# Patient Record
Sex: Female | Born: 1965 | Race: Black or African American | Hispanic: No | Smoking: Former smoker
Health system: Southern US, Community
[De-identification: ages and names within clinical notes are randomized; demographics above are authoritative.]

## PROBLEM LIST (undated history)

## (undated) ENCOUNTER — Ambulatory Visit (HOSPITAL_COMMUNITY): Admission: EM | Payer: BC Managed Care – PPO | Source: Home / Self Care

## (undated) DIAGNOSIS — Z86018 Personal history of other benign neoplasm: Secondary | ICD-10-CM

## (undated) DIAGNOSIS — R079 Chest pain, unspecified: Secondary | ICD-10-CM

## (undated) DIAGNOSIS — D219 Benign neoplasm of connective and other soft tissue, unspecified: Secondary | ICD-10-CM

## (undated) DIAGNOSIS — N946 Dysmenorrhea, unspecified: Secondary | ICD-10-CM

## (undated) DIAGNOSIS — G479 Sleep disorder, unspecified: Secondary | ICD-10-CM

## (undated) DIAGNOSIS — I471 Supraventricular tachycardia, unspecified: Secondary | ICD-10-CM

## (undated) DIAGNOSIS — R002 Palpitations: Secondary | ICD-10-CM

## (undated) DIAGNOSIS — F1010 Alcohol abuse, uncomplicated: Secondary | ICD-10-CM

## (undated) DIAGNOSIS — IMO0002 Reserved for concepts with insufficient information to code with codable children: Secondary | ICD-10-CM

## (undated) DIAGNOSIS — R0602 Shortness of breath: Secondary | ICD-10-CM

## (undated) DIAGNOSIS — I499 Cardiac arrhythmia, unspecified: Secondary | ICD-10-CM

## (undated) DIAGNOSIS — N920 Excessive and frequent menstruation with regular cycle: Secondary | ICD-10-CM

## (undated) DIAGNOSIS — I498 Other specified cardiac arrhythmias: Secondary | ICD-10-CM

## (undated) DIAGNOSIS — D649 Anemia, unspecified: Secondary | ICD-10-CM

## (undated) HISTORY — DX: Supraventricular tachycardia: I47.1

## (undated) HISTORY — DX: Supraventricular tachycardia, unspecified: I47.10

## (undated) HISTORY — DX: Excessive and frequent menstruation with regular cycle: N92.0

## (undated) HISTORY — DX: Dysmenorrhea, unspecified: N94.6

## (undated) HISTORY — PX: TUBAL LIGATION: SHX77

## (undated) HISTORY — DX: Chest pain, unspecified: R07.9

## (undated) HISTORY — DX: Personal history of other benign neoplasm: Z86.018

## (undated) HISTORY — PX: CARDIAC ELECTROPHYSIOLOGY MAPPING AND ABLATION: SHX1292

## (undated) HISTORY — DX: Sleep disorder, unspecified: G47.9

## (undated) HISTORY — PX: AUGMENTATION MAMMAPLASTY: SUR837

## (undated) HISTORY — DX: Palpitations: R00.2

## (undated) HISTORY — PX: ABDOMINAL HYSTERECTOMY: SHX81

## (undated) HISTORY — PX: UTERINE FIBROID SURGERY: SHX826

---
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
## 2000-07-27 HISTORY — PX: COMBINED AUGMENTATION MAMMAPLASTY AND ABDOMINOPLASTY: SUR291

## 2002-04-08 ENCOUNTER — Emergency Department (HOSPITAL_COMMUNITY): Admission: EM | Admit: 2002-04-08 | Discharge: 2002-04-08 | Payer: Self-pay | Admitting: Emergency Medicine

## 2002-04-08 ENCOUNTER — Encounter: Payer: Self-pay | Admitting: Emergency Medicine

## 2002-07-25 ENCOUNTER — Encounter: Admission: RE | Admit: 2002-07-25 | Discharge: 2002-07-25 | Payer: Self-pay | Admitting: Sports Medicine

## 2002-07-31 ENCOUNTER — Encounter: Admission: RE | Admit: 2002-07-31 | Discharge: 2002-07-31 | Payer: Self-pay | Admitting: Family Medicine

## 2002-08-01 ENCOUNTER — Encounter: Admission: RE | Admit: 2002-08-01 | Discharge: 2002-08-01 | Payer: Self-pay | Admitting: Family Medicine

## 2002-08-11 ENCOUNTER — Encounter: Admission: RE | Admit: 2002-08-11 | Discharge: 2002-08-11 | Payer: Self-pay | Admitting: Family Medicine

## 2005-01-22 ENCOUNTER — Emergency Department (HOSPITAL_COMMUNITY): Admission: EM | Admit: 2005-01-22 | Discharge: 2005-01-22 | Payer: Self-pay | Admitting: Family Medicine

## 2005-02-22 ENCOUNTER — Emergency Department (HOSPITAL_COMMUNITY): Admission: EM | Admit: 2005-02-22 | Discharge: 2005-02-22 | Payer: Self-pay | Admitting: Family Medicine

## 2005-04-12 ENCOUNTER — Emergency Department (HOSPITAL_COMMUNITY): Admission: EM | Admit: 2005-04-12 | Discharge: 2005-04-12 | Payer: Self-pay | Admitting: Family Medicine

## 2005-04-23 ENCOUNTER — Ambulatory Visit: Payer: Self-pay | Admitting: Internal Medicine

## 2005-06-10 ENCOUNTER — Ambulatory Visit: Payer: Self-pay | Admitting: Internal Medicine

## 2005-08-13 ENCOUNTER — Emergency Department (HOSPITAL_COMMUNITY): Admission: EM | Admit: 2005-08-13 | Discharge: 2005-08-13 | Payer: Self-pay | Admitting: Family Medicine

## 2005-08-14 ENCOUNTER — Ambulatory Visit (HOSPITAL_COMMUNITY): Admission: RE | Admit: 2005-08-14 | Discharge: 2005-08-14 | Payer: Self-pay | Admitting: Family Medicine

## 2005-08-19 ENCOUNTER — Other Ambulatory Visit: Admission: RE | Admit: 2005-08-19 | Discharge: 2005-08-19 | Payer: Self-pay | Admitting: Obstetrics and Gynecology

## 2006-09-23 DIAGNOSIS — R002 Palpitations: Secondary | ICD-10-CM

## 2006-09-23 HISTORY — DX: Palpitations: R00.2

## 2006-11-01 ENCOUNTER — Ambulatory Visit: Payer: Self-pay | Admitting: Internal Medicine

## 2007-02-14 ENCOUNTER — Emergency Department (HOSPITAL_COMMUNITY): Admission: EM | Admit: 2007-02-14 | Discharge: 2007-02-14 | Payer: Self-pay | Admitting: Family Medicine

## 2007-07-18 ENCOUNTER — Emergency Department (HOSPITAL_COMMUNITY): Admission: EM | Admit: 2007-07-18 | Discharge: 2007-07-18 | Payer: Self-pay | Admitting: Emergency Medicine

## 2007-07-18 ENCOUNTER — Ambulatory Visit: Payer: Self-pay | Admitting: Cardiology

## 2007-08-21 ENCOUNTER — Emergency Department (HOSPITAL_COMMUNITY): Admission: EM | Admit: 2007-08-21 | Discharge: 2007-08-21 | Payer: Self-pay | Admitting: Family Medicine

## 2007-08-22 ENCOUNTER — Ambulatory Visit: Payer: Self-pay | Admitting: Internal Medicine

## 2007-09-19 ENCOUNTER — Emergency Department (HOSPITAL_COMMUNITY): Admission: EM | Admit: 2007-09-19 | Discharge: 2007-09-19 | Payer: Self-pay | Admitting: Family Medicine

## 2007-11-07 ENCOUNTER — Emergency Department (HOSPITAL_COMMUNITY): Admission: EM | Admit: 2007-11-07 | Discharge: 2007-11-07 | Payer: Self-pay | Admitting: Family Medicine

## 2008-04-04 ENCOUNTER — Emergency Department (HOSPITAL_COMMUNITY): Admission: EM | Admit: 2008-04-04 | Discharge: 2008-04-04 | Payer: Self-pay | Admitting: Emergency Medicine

## 2008-11-13 DIAGNOSIS — R079 Chest pain, unspecified: Secondary | ICD-10-CM

## 2008-11-13 DIAGNOSIS — I498 Other specified cardiac arrhythmias: Secondary | ICD-10-CM

## 2008-11-13 DIAGNOSIS — F1010 Alcohol abuse, uncomplicated: Secondary | ICD-10-CM

## 2008-11-13 DIAGNOSIS — I471 Supraventricular tachycardia, unspecified: Secondary | ICD-10-CM | POA: Insufficient documentation

## 2008-11-13 HISTORY — DX: Chest pain, unspecified: R07.9

## 2008-11-13 HISTORY — DX: Alcohol abuse, uncomplicated: F10.10

## 2008-11-13 HISTORY — DX: Other specified cardiac arrhythmias: I49.8

## 2008-11-14 ENCOUNTER — Encounter: Payer: Self-pay | Admitting: Internal Medicine

## 2008-11-14 ENCOUNTER — Ambulatory Visit: Payer: Self-pay | Admitting: Internal Medicine

## 2008-12-12 ENCOUNTER — Telehealth: Payer: Self-pay | Admitting: Internal Medicine

## 2008-12-20 ENCOUNTER — Ambulatory Visit: Payer: Self-pay | Admitting: Internal Medicine

## 2008-12-20 ENCOUNTER — Emergency Department (HOSPITAL_COMMUNITY): Admission: EM | Admit: 2008-12-20 | Discharge: 2008-12-20 | Payer: Self-pay | Admitting: Emergency Medicine

## 2009-01-01 ENCOUNTER — Telehealth (INDEPENDENT_AMBULATORY_CARE_PROVIDER_SITE_OTHER): Payer: Self-pay | Admitting: *Deleted

## 2009-01-02 ENCOUNTER — Encounter: Payer: Self-pay | Admitting: Internal Medicine

## 2009-01-02 ENCOUNTER — Ambulatory Visit: Payer: Self-pay

## 2009-01-07 ENCOUNTER — Telehealth: Payer: Self-pay | Admitting: Internal Medicine

## 2009-01-30 ENCOUNTER — Telehealth: Payer: Self-pay | Admitting: Internal Medicine

## 2009-02-20 ENCOUNTER — Telehealth: Payer: Self-pay | Admitting: Internal Medicine

## 2009-05-06 ENCOUNTER — Emergency Department (HOSPITAL_COMMUNITY): Admission: EM | Admit: 2009-05-06 | Discharge: 2009-05-06 | Payer: Self-pay | Admitting: Family Medicine

## 2009-05-09 ENCOUNTER — Encounter (INDEPENDENT_AMBULATORY_CARE_PROVIDER_SITE_OTHER): Payer: Self-pay | Admitting: *Deleted

## 2009-05-09 ENCOUNTER — Emergency Department (HOSPITAL_COMMUNITY): Admission: EM | Admit: 2009-05-09 | Discharge: 2009-05-09 | Payer: Self-pay | Admitting: Family Medicine

## 2009-07-27 DIAGNOSIS — N946 Dysmenorrhea, unspecified: Secondary | ICD-10-CM

## 2009-07-27 HISTORY — DX: Dysmenorrhea, unspecified: N94.6

## 2009-08-29 ENCOUNTER — Encounter: Payer: Self-pay | Admitting: Internal Medicine

## 2009-11-15 ENCOUNTER — Emergency Department (HOSPITAL_COMMUNITY)
Admission: EM | Admit: 2009-11-15 | Discharge: 2009-11-15 | Payer: Self-pay | Source: Home / Self Care | Admitting: Family Medicine

## 2009-11-25 ENCOUNTER — Telehealth: Payer: Self-pay | Admitting: Internal Medicine

## 2009-12-24 ENCOUNTER — Ambulatory Visit: Payer: Self-pay | Admitting: Internal Medicine

## 2010-03-18 ENCOUNTER — Telehealth: Payer: Self-pay | Admitting: Internal Medicine

## 2010-05-13 ENCOUNTER — Encounter: Payer: Self-pay | Admitting: Internal Medicine

## 2010-05-15 ENCOUNTER — Emergency Department (HOSPITAL_COMMUNITY): Admission: EM | Admit: 2010-05-15 | Discharge: 2010-05-15 | Payer: Self-pay | Admitting: Emergency Medicine

## 2010-05-22 ENCOUNTER — Encounter: Admission: RE | Admit: 2010-05-22 | Discharge: 2010-05-22 | Payer: Self-pay | Admitting: Emergency Medicine

## 2010-06-04 ENCOUNTER — Ambulatory Visit (HOSPITAL_COMMUNITY)
Admission: RE | Admit: 2010-06-04 | Discharge: 2010-06-04 | Payer: Self-pay | Source: Home / Self Care | Admitting: Obstetrics and Gynecology

## 2010-08-28 NOTE — Assessment & Plan Note (Signed)
Summary: f54m/jss   Visit Type:  Follow-up   History of Present Illness: [name] returns today for follow-up.  She has a h/o c/p and SVT which have been controlled with medical therapy.   Her palpitations are not alwasy asociated with dyspnea and c/p.  At work she is able to walk ok without too much discomfort.  Her heart racing slows gradually.  She has been under increased stress lately over the death of her brother.  No syncope.  Problems Prior to Update: 1)  Alcohol Use  (ICD-305.00) 2)  Chest Pain  (ICD-786.50) 3)  Supraventricular Tachycardia  (ICD-427.89) 4)  Palpitations  (ICD-785.1)  Current Medications (verified): 1)  Flecainide Acetate 100 Mg Tabs (Flecainide Acetate) .... Take One Tablet By Mouth Every 12 Hours 2)  Lopressor 50 Mg Tabs (Metoprolol Tartrate) .... Take 1/2 Per Day As Needed.  Allergies (verified): No Known Drug Allergies  Past History:  Review of Systems  The patient denies chest pain, syncope, dyspnea on exertion, and peripheral edema.    Vital Signs:  Patient profile:   45 year old female Height:      59 inches Weight:      122 pounds BMI:     24.73 Pulse rate:   83 / minute BP sitting:   110 / 80  (left arm)  Vitals Entered By: Laurance Flatten CMA (Dec 24, 2009 2:37 PM)  Physical Exam  General:  Well developed, well nourished, in no acute distress.  HEENT: normal Neck: supple. No JVD. Carotids 2+ bilaterally no bruits Cor: RRR no rubs, gallops or murmur Lungs: CTA with no wheezes or rhonchi. Ab: soft, nontender. nondistended. No HSM. Good bowel sounds Ext: warm. no cyanosis, clubbing or edema Neuro: alert and oriented. Grossly nonfocal. affect pleasant    EKG  Procedure date:  12/24/2009  Findings:      Normal sinus rhythm with rate of:  83.  Impression & Recommendations:  Problem # 1:  CHEST PAIN (ICD-786.50) Her symptoms are well controlled on medical therapy.  She will follow up in a year. Her updated medication list for  this problem includes:    Lopressor 50 Mg Tabs (Metoprolol tartrate) .Marland Kitchen... Take 1/2 per day as needed.  Problem # 2:  SUPRAVENTRICULAR TACHYCARDIA (ICD-427.89) Her symptoms are well controlled on medical therapy.  She will continue her meds as below. Her updated medication list for this problem includes:    Flecainide Acetate 100 Mg Tabs (Flecainide acetate) .Marland Kitchen... Take one tablet by mouth every 12 hours    Lopressor 50 Mg Tabs (Metoprolol tartrate) .Marland Kitchen... Take 1/2 per day as needed.  Patient Instructions: 1)  Your physician recommends that you schedule a follow-up appointment in: 1 year. 2)  Your physician recommends that you continue on your current medications as directed. Please refer to the Current Medication list given to you today.

## 2010-08-28 NOTE — Letter (Signed)
Summary: [facility] Clear[facility]   Imported By: Roderic Ovens 06/02/2010 15:39:34  _____________________________________________________________________  External Attachment:    Type:   Image     Comment:   External Document

## 2010-08-28 NOTE — Miscellaneous (Signed)
  Clinical Lists Changes  Observations: Added new observation of NUCLEAR NOS: Exercise Capacity: Fair exercise capacity. BP Response: Blunted. Peak BP 109/70 Clinical Symptoms: 3/10 CP. + dyspnea ECG Impression: Insignificant upsloping ST segment depression. Overall Impression: Normal stress nuclear study.  (01/02/2009 11:39)      Nuclear Study  Procedure date:  01/02/2009  Findings:      Exercise Capacity: Fair exercise capacity. BP Response: Blunted. Peak BP 109/70 Clinical Symptoms: 3/10 CP. + dyspnea ECG Impression: Insignificant upsloping ST segment depression. Overall Impression: Normal stress nuclear study.

## 2010-08-28 NOTE — Progress Notes (Signed)
Summary: refill  Phone Note Refill Request Message from:  Patient on Nov 25, 2009 3:14 PM  Refills Requested: Medication #1:  FLECAINIDE ACETATE 100 MG TABS Take one tablet by mouth every 12 hours  Medication #2:  LOPRESSOR 50 MG TABS Take 1/2 per day as needed.. Send to [facility] [address]  Initial call taken by: Judie Grieve,  Nov 25, 2009 3:15 PM Caller: Patient    Prescriptions: LOPRESSOR 50 MG TABS (METOPROLOL TARTRATE) Take 1/2 per day as needed.  #90 x 3   Entered by:   Laurance Flatten CMA   Authorized by:   Laren Boom, MD, [facility]   Signed by:   Laurance Flatten CMA on 11/27/2009   Method used:   Electronically to        [facility]  [address] [phone]* (retail)       [address]       [place], [place]  [address]       Ph: [phone] or [phone]       Fax: [phone]   RxID:   [id number] FLECAINIDE ACETATE 100 MG TABS (FLECAINIDE ACETATE) Take one tablet by mouth every 12 hours  #180 x 3   Entered by:   Laurance Flatten CMA   Authorized by:   Laren Boom, MD, [facility]   Signed by:   Laurance Flatten CMA on 11/27/2009   Method used:   [facility]ically [facility]        [facility]  [address] [phone]* (retail)       [address]       [place], [place]  [address]       Ph: [phone] or [phone]       Fax: [phone]   RxID:   [id number]

## 2010-08-28 NOTE — Progress Notes (Signed)
Summary: Calling regarding getting breast implants  Phone Note Call from Patient Call back at Home Phone [phone]   Caller: Patient Summary of Call: Pt request call regarding getting breast implants Initial call taken by: Judie Grieve,  March 18, 2010 3:25 PM  Follow-up for Phone Call        lmom for pt to call me back Dennis Bast, RN, BSN  March 18, 2010 4:03 PM  Additional Follow-up for Phone Call Additional follow up Details #1::        pt returning call-pls call Glynda Jaeger  March 18, 2010 4:51 PM Ok per Dr Ladona Ridgel to proceed with surgery.  LMOM for pt Dennis Bast, RN, BSN  March 18, 2010 5:48 PM  Pt returning call Judie Grieve  March 19, 2010 3:17 PM spoke with pt she is going to surgeon on Fri and will call us back with details as to when and who the doctor is.  She couldn't remember the name of the doctor Dennis Bast, RN, BSN  March 20, 2010 3:10 PM    Additional Follow-up for Phone Call Additional follow up Details #2::    per pt calling back  around 3:05 or 3:10 p.m. cell phone [phone] Lorne Skeens  March 19, 2010 12:21 PM

## 2010-09-07 ENCOUNTER — Inpatient Hospital Stay (INDEPENDENT_AMBULATORY_CARE_PROVIDER_SITE_OTHER)
Admission: RE | Admit: 2010-09-07 | Discharge: 2010-09-07 | Disposition: A | Discharge status: Home / Self Care | Payer: Private Health Insurance - Indemnity | Source: Ambulatory Visit | Attending: Emergency Medicine | Admitting: Emergency Medicine

## 2010-09-07 DIAGNOSIS — K0501 Acute gingivitis, non-plaque induced: Secondary | ICD-10-CM

## 2010-09-07 DIAGNOSIS — K089 Disorder of teeth and supporting structures, unspecified: Secondary | ICD-10-CM

## 2010-10-07 LAB — CBC
HCT: 29.5 % — ABNORMAL LOW (ref 36.0–46.0)
MCH: 19.8 pg — ABNORMAL LOW (ref 26.0–34.0)
MCHC: 31.4 g/dL (ref 30.0–36.0)
RDW: 21.4 % — ABNORMAL HIGH (ref 11.5–15.5)

## 2010-10-10 ENCOUNTER — Telehealth: Payer: Self-pay | Admitting: Physician Assistant

## 2010-10-13 ENCOUNTER — Other Ambulatory Visit: Payer: Private Health Insurance - Indemnity

## 2010-10-14 ENCOUNTER — Ambulatory Visit (INDEPENDENT_AMBULATORY_CARE_PROVIDER_SITE_OTHER): Payer: Private Health Insurance - Indemnity | Admitting: *Deleted

## 2010-10-14 DIAGNOSIS — R002 Palpitations: Secondary | ICD-10-CM

## 2010-10-14 LAB — URINE CULTURE: Colony Count: >=100000

## 2010-10-14 LAB — CBC WITH DIFFERENTIAL/PLATELET
Basophils Absolute: 0.1 10*3/uL (ref 0.0–0.1)
Eosinophils Absolute: 0.1 10*3/uL (ref 0.0–0.7)
Hemoglobin: 10.3 g/dL — ABNORMAL LOW (ref 12.0–15.0)
Lymphocytes Relative: 37.9 % (ref 12.0–46.0)
MCHC: 31.9 g/dL (ref 30.0–36.0)
Monocytes Relative: 7.9 % (ref 3.0–12.0)
Neutrophils Relative %: 51.5 % (ref 43.0–77.0)
Platelets: 353 10*3/uL (ref 150.0–400.0)
RDW: 23.1 % — ABNORMAL HIGH (ref 11.5–14.6)

## 2010-10-14 LAB — POCT URINALYSIS DIP (DEVICE)
Bilirubin Urine: NEGATIVE
Glucose, UA: NEGATIVE mg/dL
Ketones, ur: NEGATIVE mg/dL
Protein, ur: NEGATIVE mg/dL
Specific Gravity, Urine: 1.015 (ref 1.005–1.030)

## 2010-10-14 LAB — BASIC METABOLIC PANEL
BUN: 7 mg/dL (ref 6–23)
CO2: 25 mEq/L (ref 19–32)
Calcium: 9 mg/dL (ref 8.4–10.5)
Creatinine, Ser: 0.6 mg/dL (ref 0.4–1.2)
GFR: 150.13 mL/min (ref >60.00)
Glucose, Bld: 88 mg/dL (ref 70–99)
Sodium: 140 mEq/L (ref 135–145)

## 2010-10-14 NOTE — Progress Notes (Signed)
Summary: surgical clearance  Phone Note From Other Clinic   Summary of Call: I spoke to Dr. Brantley Persons regarding this patient today.  She wishes to have a breast augmentation.  The surgery will be next week. Her last myoview was in 2010 and was negative.   I spoke with Dr. Ladona Ridgel.  She is cleared for surgery and will be low risk for cardiovascular complications.   Please fax a copy of this phone note and her last office note and her last ekg to Dr.  Sherald Hess. Fax # [phone].  Please call [phone] to notify the surgeon she is cleared.  I left a message with their office. Initial call taken by: Tereso Newcomer PA-C,  October 10, 2010 1:20 PM  Follow-up for Phone Call        information faxed to number provided Deliah Goody, RN  October 10, 2010 2:05 PM      Appended Document: Orders Update    Clinical Lists Changes  Orders: Added new Test order of TLB-BMP (Basic Metabolic Panel-BMET) (80048-METABOL) - Signed Added new Test order of TLB-CBC Platelet - w/Differential (85025-CBCD) - Signed      Appended Document: surgical clearance I s/w Dr. Colin Broach today who called and asked if we could do a bmet/cbc on pt.York Spaniel she received the ekg and the ov note that we sent earlier today but there were no labs. I exlpained there were no recent labs.Marland KitchenMarland KitchenMarland KitchenTook to Dr. Daleen Squibb who is DOD today and he gave ok for bmet/cbc to be done here in our office.Marland KitchenMarland KitchenPt sees Dr. Ladona Ridgel...Marland KitchenMarland KitchenWill put labs in EPIC for 10/13/10.Marland KitchenMarland KitchenMarland KitchenPlease fax results to Dr. Sherald Hess as soon as results come in...Marland KitchenMarland KitchenDanielle Rankin, CMA

## 2010-11-04 LAB — POCT I-STAT, CHEM 8
Calcium, Ion: 1.22 mmol/L (ref 1.12–1.32)
Creatinine, Ser: 0.6 mg/dL (ref 0.4–1.2)
Glucose, Bld: 89 mg/dL (ref 70–99)
HCT: 33 % — ABNORMAL LOW (ref 36.0–46.0)
Hemoglobin: 11.2 g/dL — ABNORMAL LOW (ref 12.0–15.0)
Potassium: 3.7 mEq/L (ref 3.5–5.1)

## 2010-11-04 LAB — TROPONIN I: Troponin I: 0.01 ng/mL (ref 0.00–0.06)

## 2010-11-04 LAB — POCT CARDIAC MARKERS
CKMB, poc: <1 ng/mL — ABNORMAL LOW (ref 1.0–8.0)
Troponin i, poc: <0.05 ng/mL (ref 0.00–0.09)

## 2010-11-04 LAB — CK TOTAL AND CKMB (NOT AT ARMC)
CK, MB: 0.5 ng/mL (ref 0.3–4.0)
Total CK: 91 U/L (ref 7–177)

## 2010-12-09 NOTE — Assessment & Plan Note (Signed)
[facility]                         ELECTROPHYSIOLOGY OFFICE NOTE   [name], [name]                   MRN:          [id number]  DATE:08/22/2007                            DOB:          [birth year]    [name]  returns today for follow-up.  She is a pleasant young  woman with a history of SVT who has been maintaining in sinus rhythm  very nicely on flecainide who was hospitalized several weeks ago with  chest pain.  She ruled out for MI and has been stable since.  The  patient notes that in retrospect she had been drinking alcoholic  beverages in excess the night before her hospitalization and has not  done that since then and has had no recurrent symptoms and wonders if it  in fact her chest pain was related to alcohol consumption.  She  otherwise had no specific complaints today.   PHYSICAL EXAMINATION:  She is a pleasant, well-appearing, young woman in  no distress.  Blood pressure 112/68, pulse 79 and regular, respirations were 18.  Weight was 116 pounds.  NECK:  Revealed no jugular distention.  LUNGS:  Clear bilaterally to auscultation.  No wheezes, rales or rhonchi  are present.  CARDIOVASCULAR:  Regular rate and rhythm with normal S1 and S2. There  are no murmurs, rubs or gallops present.  EXTREMITIES:  Demonstrated no edema.   MEDICATIONS:  Flecainide 100 twice a day. She is no longer on Cardizem.   IMPRESSION:  1. Symptomatic SVT.  2. Chest pain likely secondary to esophageal irritation from alcohol      excess.   DISCUSSION:  Overall [name] is stable. I have asked that she  refrain from using any alcohol and caffeine.  She will continue on her  flecainide which is controlling her SVT very nicely.  I will see her  back in the year, sooner should she have anymore symptoms of SVT or  chest pain.     Doylene Canning. Ladona Ridgel, MD  Electronically Signed    GWT/MedQ  DD: 08/22/2007  DT: 08/22/2007  Job #: [id number]

## 2010-12-09 NOTE — Consult Note (Signed)
[name], [name] [name]            ACCOUNT NO.:  [id number]   MEDICAL RECORD NO.:  [id number]          PATIENT TYPE:  EMS   LOCATION:  MAJO                         FACILITY:  [facility]   PHYSICIAN:  Madolyn Frieze. Jens Som, MD, FACCDATE OF BIRTH:  Dec 21, 1965   DATE OF CONSULTATION:  DATE OF DISCHARGE:  07/18/2007                                 CONSULTATION   PRIMARY CARE PHYSICIAN:  The patient does not have primary care  physician.   BRIEF HISTORY:  [name] [name] is a 45 year old African-American female  who presents to [facility] emergency room complaining of chest  discomfort.  She states that on Friday evening at a friend's birthday  party, she did have one shot of liquor around 6:00 p.m.  Around 9:00  p.m. while sitting at a table, she gradually developed a left-sided  anterior chest pressure sensation that did not radiate, but she did  notice some shortness of breath.  She specifically denied nausea,  vomiting and diaphoresis.  She did take two Aleve, and she took her  flecainide early.  Her discomfort went from a 7 to 0, over 20 minutes.  However, her symptoms reoccurred around 11:00 p.m., and at that time she  also noticed sharp pains that were primarily associated with a every  other heart beat.  These sensations have been occurring ever since  Friday evening.  She does have intermittent relief for a short time,  after taking her doses of flecainide in the morning.  The sharp pains  last seconds.  She specifically states they are non-pleuritic.  They do  not change with position or with food.  She thinks that they are worse  while at rest, and with exertion they seem to diminish.  Since being in  the emergency room, her sharp pains have diminished, and she notices a  slight pressure.   PAST MEDICAL HISTORY:  NO KNOWN DRUG ALLERGIES.   MEDICATIONS PRIOR TO ADMISSION:  Include:  1. Flecainide 100 mg b.i.d.  2. Cardizem unknown dosage daily.  3. History is notable for SVT with EP  study at [facility] that showed both right and left atrial foci, was not      induced, thus she did not undergo ablation.  She was started on      flecainide, has been followed up intermittently with Dr. Lewayne Bunting since that time.   SURGICAL HISTORY:  Is notable for bilateral tubal ligation, a tummy  tuck.  She specifically denies myocardial infarction, CVA, diabetes,  hypertension, COPD, bleeding disorder, thyroid dysfunction.  She does  not know her cholesterol status.   SOCIAL HISTORY:  She resides in [place] with her grandchild.  She has  four children, two grandchildren.  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  She has not smoked in over 15 years, rare alcohol, denies  drugs or herbal medications.  She does not diet or follow a specific  exercise.  Both parents are alive.  Mother has a history of SVT, her  father hypertension.  She has a sister  who is alive and well.   REVIEW OF SYSTEMS:  Is notable for headache, cavities, chronic shortness  of breath, intermittent orthopnea and PND, nocturia.  Her last menstrual  period was on June 06, 2007 and tends to be slightly irregular.  She  specifically denied being pregnant, also that she has had a history of  BTL.  She describes generalized weakness and constipation.  All other  points are unremarkable.   PHYSICAL EXAM:  VITAL SIGNS:  By Dr. Jens Som, shows temperature 98.6,  blood pressure 125/86, pulse 70 and regular, respirations 16 and  regular, 100% sats on room air.  HEENT:  Is unremarkable except for poor dentition.  NECK:  Supple without thyromegaly, adenopathy, JVD, or carotid bruits.  HEART:  PMI is not displaced.  Regular rate and rhythm without murmurs,  rubs, clicks or gallops.  All pulses are symmetrical and intact without  abdominal or femoral bruits.  LUNGS:  Symmetrical excursion.  Clear to auscultation.  SKIN:  Integument was intact.  ABDOMEN:  Slightly rounded.  Bowel  sounds present without organomegaly,  masses or tenderness.  EXTREMITIES:  Negative cyanosis, clubbing or edema.  She does have chest  wall tenderness in the same location that she describes her discomfort.  MUSCULOSKELETAL:  Unremarkable.  NEURO:  Unremarkable.  Chest x-ray showed no active disease.  EKG:  Shows normal sinus rhythm, normal axis, ventricular rate 61,  nonspecific ST-T wave changes, minor changes since December 2003.   H&H is 9.3 and 29.6 with MCV of 63.8.  MCHC is 31.4, platelets 374, WBCs  5.5.  I-Stat shows a sodium of 139, potassium 2.5, BUN 6, creatinine  0.6, glucose 92.  One point of care is negative.   IMPRESSION:  Prolonged atypical chest discomfort is somewhat  reproducible on exam.  Microcytic anemia may be related to menses,  history is noted per past medical history.   PLAN:  Dr. Jens Som reviewed the patient's history, spoke with and  examined the patient and agrees with the above.  We will check another  point of care marker in the emergency room.  If this is unremarkable, we  will allow her to be discharged home.  I have arranged for a outpatient  stress echocardiogram on July 28, 2006 at 2:00 p.m., also a followup  with Dr. Ladona Ridgel on August 22, 2007 at 9:15 p.m.  We have also asked her  to begin taking ferrous sulfate 325 mg p.o. b.i.d. and obtain a primary  care physician for anemia workup.  Dr. Jens Som feels that Dr. Ladona Ridgel  should consider checking a CBC and reviewing with the patient if she has  obtained a primary care physician to follow up on her anemia at the time  of his appointment.      Joellyn Rued, PA-C      Madolyn Frieze Jens Som, MD, [facility]  Electronically Signed    EW/MEDQ  D:  07/18/2007  T:  07/18/2007  Job:  [id number]   cc:   Doylene Canning. Ladona Ridgel, MD

## 2010-12-09 NOTE — Consult Note (Signed)
[name], [name]            ACCOUNT NO.:  [id number]   MEDICAL RECORD NO.:  [id number]          PATIENT TYPE:  EMS   LOCATION:  MAJO                         FACILITY:  [facility]   PHYSICIAN:  Robin Canning. Ladona Ridgel, MD    DATE OF BIRTH:  [birth year]   DATE OF CONSULTATION:  12/20/2008  DATE OF DISCHARGE:  12/20/2008                                 CONSULTATION   PRIMARY CARDIOLOGIST:  Robin Canning. Ladona Ridgel, MD   REASON FOR CONSULTATION:  Chest pressure.   HISTORY OF PRESENT ILLNESS:  [name] is a 45 year old female with  no known history of coronary artery disease but a history significant  for supraventricular tachycardia (on flecainide) and history of chest  pain without evidence of a cardiac etiology, who presents with 12 hours  of chest pressure.  The patient reports drinking 2 alcoholic beverages  last Saturday which was immediately by palpitations.  The patient  reports intermittent palpitations since that time and starting yesterday  in the early evening the patient reports chest pressure in the  substernal area, nonradiating, 6/10 at worst, which lasted all evening  and throughout the night.  Pain was associated with mild nausea and some  mild presyncopal sensation.  The patient denies any syncopal events.  The patient also denies any vomiting.  The patient also reports chronic  shortness of breath, dyspnea on exertion, orthopnea, PND, but denies any  lower extremity edema.  The patient also denies any significant changes  over the last several weeks to these symptoms.  The patient also reports  a mild productive cough recently as well as some coryza symptoms.  The  patient reports that chest pressure has resolved since being seen in the  emergency department; however, her palpitations, although mild continue  intermittently.   PAST MEDICAL HISTORY:  1. SVT.  2. History of chest pain without known cardiac etiology.   SOCIAL HISTORY:  The patient lives in [place] alone.   She works full  time and reports being physically active at her job.  She has a 6 pack-  year smoking history but has not smoked in at least 15 years.  She  drinks socially but very rarely.  She takes no illicit drugs nor does  she take any herbal medications.  She has a regular diet and does no  regular exercise.   FAMILY HISTORY:  Mother living at age 81, she has a history of cardiac  arrhythmia but no known history of coronary artery disease.  Father  living at age 71, history of hypertension.  She has 5 sisters and 2  brothers, none of which have coronary artery disease.   REVIEW OF SYSTEMS:  As described in HPI, otherwise the patient reports  chronic constipation and intermittent to mild headache since yesterday.  All other systems reviewed and were negative.   ALLERGIES:  NKDA.   MEDICATIONS:  Flecainide 100 mg p.o. b.i.d.  In the emergency  department, she received Toradol 30 mg IV x1.   PHYSICAL EXAMINATION:  VITAL SIGNS:  Temperature 99.6 degrees  Fahrenheit, BP 119/79, pulse 104, respiration rate 20, O2 saturation 99%  on 3 L by nasal cannula.  GENERAL:  The patient alert and oriented x3 in no apparent distress,  able to speak and move easily without any respiratory distress.  HEENT:  Her head is normocephalic, atraumatic.  Pupils equal, round, and  reactive to light.  Extraocular muscles are intact.  Nares were patent  without discharge.  Dentition is good.  Oropharynx without erythema or  exudates.  NECK:  Supple without lymphadenopathy.  No thyromegaly.  No JVD.  No  bruits.  HEART:  Heart rate is regular with audible S1 and S2.  No clicks, rubs,  murmurs, or gallops.  Pulses are 2+ and equal in both upper and lower  extremities bilaterally.  LUNGS:  Clear to auscultation bilaterally but decreased breath sounds at  the bases.  SKIN:  No rashes, lesions, or petechiae.  ABDOMEN:  Soft, nontender, nondistended.  Normal abdominal bowel sounds.  No rebound or guarding.   No hepatosplenomegaly.  No pulsations.  EXTREMITIES:  No clubbing, cyanosis, or edema.  MUSCULOSKELETAL:  No joint deformity or effusions.  No spinal or CVA  tenderness.  NEUROLOGIC:  Cranial II through XII are grossly intact.  Strength 5/5 in  all extremities and axial groups.  Normal sensation throughout and  normal cerebellar function.   RADIOLOGY:  The patient had chest x-ray that showed no significant  findings.   EKG which showed a sinus rhythm with frequent premature atrial complexes  and a rate of 97 with no significant ST-T-wave changes except for  minimal T-wave inversion in V5 and V6 which is different from EKG  performed December on 22, 2008.  The patient has no significant Q-waves.  Normal axis close to voltage criteria for left ventricular hypertrophy.  PR is 148, QRS is 18, QTc is 442.   LABORATORY DATA:  CBC pending.  Sodium 139, potassium 3.7, chloride 106,  CO2 23, BUN of less than 3, creatinine is 0.6, glucose is 89.  First set  of point-of-care markers is negative.   ASSESSMENT AND PLAN:  [name] is a 45 year old female with no  known history of coronary artery disease, but history significant for  supraventricular tachycardia and intermittent chest pain without known  cardiac etiology presenting with atypical chest pain since yesterday  which has now resolved.  1. Chest pain.  Very atypical for cardiac etiology, now pain free.      Dr. Ladona [name] suspects this may be related to her palpitations and      which are likely frequent premature ventricular contractions.  Our      plan will be to check a full set of cardiac enzymes at 1:00 p.m.,      and as long as these are negative and she continues to have no      significant chest pain, we will schedule her for an outpatient      stress Myoview secondary to her EKG changes as described above as      well as her chronic syndromology.  2. History of supraventricular tachycardia.  Per Dr. Ladona [name], the      patient  has a history of noncompliance with her medications and she      has received education as the importance of 100% compliance with      her flecainide.      [name]      Robin Canning. Ladona Ridgel, MD  Electronically Signed    MS/MEDQ  D:  12/20/2008  T:  12/21/2008  Job:  [id number]

## 2010-12-12 NOTE — Assessment & Plan Note (Signed)
[facility]                         ELECTROPHYSIOLOGY OFFICE NOTE   Joshua, Zeringue Berlin Hun                   MRN:          [id number]  DATE:11/01/2006                            DOB:          04/15/1966    Ms. Diclemente returns here for follow-up.  We have not seen her in a  year and a half.  She has a history of SVT with multiple foci and has  been maintained fairly nicely in sinus rhythm on a combination of  flecainide and Cardizem.  Today she returns for follow-up.  She notes  occasional palpitations but no particularly sustained racing episodes  and no visits to the emergency room.   PHYSICAL EXAMINATION:  GENERAL:  She is a pleasant, well-appearing woman  in no distress.  VITAL SIGNS:  Blood pressure 102/74, the pulse is 75 and regular,  respirations are 18.  The weight was 118 pounds.  NECK:  No jugular venous distention.  LUNGS:  Clear bilaterally to auscultation with no wheezes, rales or  rhonchi.  CARDIOVASCULAR:  A regular rate and rhythm with a normal S1 and S2.  EXTREMITIES:  No edema.   The EKG demonstrates normal sinus rhythm with normal axis and intervals.   IMPRESSION:  1. Recurrent supraventricular tachycardia.  2. Flecainide therapy secondary to #1.   DISCUSSION:  Overall, Ms. Cushman is stable and she is tolerating her  flecainide in combination with Cardizem very nicely.  We will see her  back in one year.     Doylene Canning. Ladona Ridgel, MD  Electronically Signed    GWT/MedQ  DD: 11/01/2006  DT: 11/02/2006  Job #: [id number]

## 2011-02-05 ENCOUNTER — Other Ambulatory Visit: Payer: Self-pay | Admitting: *Deleted

## 2011-02-12 ENCOUNTER — Telehealth: Payer: Self-pay | Admitting: Internal Medicine

## 2011-02-12 ENCOUNTER — Other Ambulatory Visit: Payer: Self-pay | Admitting: *Deleted

## 2011-02-12 MED ORDER — FLECAINIDE ACETATE 100 MG PO TABS: 100.0000 mg | ORAL_TABLET | Freq: Two times a day (BID) | ORAL | Status: DC

## 2011-02-12 NOTE — Telephone Encounter (Signed)
Spoke with [facility] sent in wrong  Should have been for 180 pills with 1 refill  Pt has been getting 3 months at a time  It had been sent in for 30 pills We have this corrected

## 2011-02-12 NOTE — Telephone Encounter (Signed)
Pharmacy calling regarding flecainide. A e-RX was sent with only 2 wks supply with multiple refills.  Pharmacy wants to double check to see if dose changed or in we would like to change the quantity. Please return call to pharmacy and advise.

## 2011-03-23 ENCOUNTER — Emergency Department (HOSPITAL_COMMUNITY)
Admission: EM | Admit: 2011-03-23 | Discharge: 2011-03-23 | Disposition: A | Discharge status: Home / Self Care | Payer: Private Health Insurance - Indemnity | Attending: Emergency Medicine | Admitting: Emergency Medicine

## 2011-03-23 ENCOUNTER — Emergency Department (HOSPITAL_COMMUNITY): Payer: Private Health Insurance - Indemnity

## 2011-03-23 ENCOUNTER — Telehealth: Payer: Self-pay | Admitting: Internal Medicine

## 2011-03-23 DIAGNOSIS — R002 Palpitations: Secondary | ICD-10-CM | POA: Insufficient documentation

## 2011-03-23 DIAGNOSIS — Z79899 Other long term (current) drug therapy: Secondary | ICD-10-CM | POA: Insufficient documentation

## 2011-03-23 LAB — POCT I-STAT, CHEM 8
Calcium, Ion: 1.32 mmol/L (ref 1.12–1.32)
Glucose, Bld: 102 mg/dL — ABNORMAL HIGH (ref 70–99)
HCT: 41 % (ref 36.0–46.0)
Hemoglobin: 13.9 g/dL (ref 12.0–15.0)
Potassium: 3.6 mEq/L (ref 3.5–5.1)

## 2011-03-23 LAB — POCT I-STAT TROPONIN I: Troponin i, poc: 0 ng/mL (ref 0.00–0.08)

## 2011-03-23 LAB — URINALYSIS, ROUTINE W REFLEX MICROSCOPIC
Glucose, UA: NEGATIVE mg/dL
Ketones, ur: 15 mg/dL — AB
Protein, ur: NEGATIVE mg/dL
Urobilinogen, UA: 1 mg/dL (ref 0.0–1.0)

## 2011-03-23 LAB — URINE MICROSCOPIC-ADD ON

## 2011-03-23 NOTE — Telephone Encounter (Signed)
C/O heart racing would like to be seen today if possible.

## 2011-03-23 NOTE — Telephone Encounter (Signed)
Called and lmom for pt to call me back

## 2011-04-07 NOTE — Telephone Encounter (Signed)
Spoke with person who answered phone and let them know ai was returning her call and had left a prior message to call me  If she needs a follow up please cal [place] back so we can get her in if she is still having problems

## 2011-04-16 LAB — POCT URINALYSIS DIP (DEVICE)
Bilirubin Urine: NEGATIVE
Ketones, ur: NEGATIVE
pH: 6.5

## 2011-04-17 LAB — POCT URINALYSIS DIP (DEVICE): pH: 6

## 2011-04-21 LAB — URINE CULTURE: Colony Count: 75000

## 2011-04-21 LAB — POCT URINALYSIS DIP (DEVICE)
Bilirubin Urine: NEGATIVE
Glucose, UA: NEGATIVE
Operator id: 235561
Specific Gravity, Urine: 1.02

## 2011-05-01 LAB — DIFFERENTIAL
Basophils Absolute: 0
Lymphs Abs: 2.3
Monocytes Relative: 12

## 2011-05-01 LAB — I-STAT 8, (EC8 V) (CONVERTED LAB)
BUN: 6
Chloride: 109
HCT: 35 — ABNORMAL LOW
Hemoglobin: 11.9 — ABNORMAL LOW
Operator id: 198171
Sodium: 139
pCO2, Ven: 48.1

## 2011-05-01 LAB — POCT CARDIAC MARKERS
Myoglobin, poc: 41.2
Operator id: 198171
Operator id: 198171

## 2011-05-01 LAB — CBC
HCT: 29.6 — ABNORMAL LOW
Platelets: 374
RDW: 20.7 — ABNORMAL HIGH

## 2011-05-11 LAB — POCT URINALYSIS DIP (DEVICE)
Hgb urine dipstick: NEGATIVE
Protein, ur: 30 — AB
Specific Gravity, Urine: >=1.03
Urobilinogen, UA: 1
pH: 6

## 2011-05-11 LAB — GC/CHLAMYDIA PROBE AMP, GENITAL
Chlamydia, DNA Probe: NEGATIVE
GC Probe Amp, Genital: NEGATIVE

## 2011-10-13 ENCOUNTER — Encounter (INDEPENDENT_AMBULATORY_CARE_PROVIDER_SITE_OTHER): Payer: Private Health Insurance - Indemnity | Admitting: Obstetrics and Gynecology

## 2011-10-13 DIAGNOSIS — N946 Dysmenorrhea, unspecified: Secondary | ICD-10-CM

## 2011-10-13 DIAGNOSIS — N390 Urinary tract infection, site not specified: Secondary | ICD-10-CM

## 2011-10-13 DIAGNOSIS — N949 Unspecified condition associated with female genital organs and menstrual cycle: Secondary | ICD-10-CM

## 2011-10-13 DIAGNOSIS — D259 Leiomyoma of uterus, unspecified: Secondary | ICD-10-CM

## 2011-10-13 DIAGNOSIS — N920 Excessive and frequent menstruation with regular cycle: Secondary | ICD-10-CM

## 2011-10-26 ENCOUNTER — Other Ambulatory Visit: Payer: Self-pay | Admitting: Obstetrics and Gynecology

## 2011-10-26 DIAGNOSIS — D259 Leiomyoma of uterus, unspecified: Secondary | ICD-10-CM

## 2011-11-03 ENCOUNTER — Ambulatory Visit (INDEPENDENT_AMBULATORY_CARE_PROVIDER_SITE_OTHER): Payer: Private Health Insurance - Indemnity

## 2011-11-03 ENCOUNTER — Ambulatory Visit (INDEPENDENT_AMBULATORY_CARE_PROVIDER_SITE_OTHER): Payer: Private Health Insurance - Indemnity | Admitting: Obstetrics and Gynecology

## 2011-11-03 ENCOUNTER — Encounter: Payer: Self-pay | Admitting: Obstetrics and Gynecology

## 2011-11-03 ENCOUNTER — Other Ambulatory Visit: Payer: Private Health Insurance - Indemnity

## 2011-11-03 VITALS — BP 102/72 | Resp 16 | Ht <= 58 in | Wt 134.0 lb

## 2011-11-03 DIAGNOSIS — D259 Leiomyoma of uterus, unspecified: Secondary | ICD-10-CM

## 2011-11-03 DIAGNOSIS — Z01419 Encounter for gynecological examination (general) (routine) without abnormal findings: Secondary | ICD-10-CM

## 2011-11-03 MED ORDER — TRANEXAMIC ACID 650 MG PO TABS: 1300.0000 mg | ORAL_TABLET | Freq: Three times a day (TID) | ORAL | Status: DC

## 2011-11-03 NOTE — Patient Instructions (Signed)
Please refer to Dr. Allyne Gee ASAP.  Pt needs primary MD secondary to c/o left arm numbness.

## 2011-11-03 NOTE — Progress Notes (Addendum)
Subjective:    [name] is a 46 y.o. female who presents for an annual exam. The patient reports:she complains of cycle a little heavy first two days of cycle with pain.   Menstrual History: OB History    Grav Para Term Preterm Abortions TAB SAB Ect Mult Living   4 4              Menarche age:     Patient's last menstrual period was 10/26/2011.     Review of Sytems A comprehensive review of systems was negative except for: numbness in left arm Breast:Negative for breast lump,nipple discharge or nipple retraction Gastrointestinal: Negative for abdominal pain, change in bowel habits or rectal bleeding Urinary:negative for symptoms    Objective:    BP 102/72  Resp 16  Ht 4\' 10"  (1.473 m)  Wt 134 lb (60.782 kg)  BMI 28.01 kg/m2  LMP 10/26/2011 Weight:  Wt Readings from Last 1 Encounters:  11/03/11 134 lb (60.782 kg)   BMI: Body mass index is 28.01 kg/(m^2). General Appearance: Alert, appropriate appearance for age. No acute distress HEENT: Grossly normal Neck / Thyroid: Supple, no masses, nodes or enlargement Lungs: clear to auscultation bilaterally Back: No CVA tenderness Breast Exam: No masses or nodes.No dimpling, nipple retraction or discharge. Cardiovascular: Regular rate and rhythm. S1, S2, no murmur Gastrointestinal: Soft, non-tender, no masses or organomegaly Pelvic Exam: External genitalia: normal general appearance Cervix: normal appearance Adnexa: normal bimanual exam Uterus: normal single, nontender Rectovaginal: not indicated Lymphatic Exam: Non-palpable nodes in neck, clavicular, axillary, or inguinal regions Skin: no rash or abnormalities Neurologic: Normal gait and speech, no tremor  Psychiatric: Alert and oriented, appropriate affect.   Wet Prep:not applicable Urinalysis:not applicable UPT: Not done  U/s today Reviewed 3 small fibroids largest 1.5cms Ut 9x6x5 and normal bil ovaries  Assessment:    Normal gyn exam with c/o Menorrhagia  and Dysmenorrhea  3 small fibroids   Plan:    mammogram pap smear return annually or prn STD screening: declined Contraception:bilateral tubal ligation  Trial of lysteda   Luby Seamans YMD

## 2011-11-04 LAB — PAP IG W/ RFLX HPV ASCU

## 2011-11-10 ENCOUNTER — Other Ambulatory Visit: Payer: Self-pay | Admitting: Obstetrics and Gynecology

## 2011-11-10 DIAGNOSIS — Z1231 Encounter for screening mammogram for malignant neoplasm of breast: Secondary | ICD-10-CM

## 2011-11-25 ENCOUNTER — Ambulatory Visit: Payer: Private Health Insurance - Indemnity

## 2011-12-01 ENCOUNTER — Telehealth: Payer: Self-pay

## 2011-12-01 NOTE — Telephone Encounter (Signed)
LM for pt to cb re test results and recommendations. [name] A

## 2011-12-01 NOTE — Telephone Encounter (Signed)
Spoke to pt re pap results and scheduled pt for colpo. Info about HPV, abnl paps and colposcopy sent by mail to pt. Melody Comas A

## 2011-12-03 ENCOUNTER — Telehealth: Payer: Self-pay | Admitting: Obstetrics and Gynecology

## 2011-12-28 ENCOUNTER — Ambulatory Visit (INDEPENDENT_AMBULATORY_CARE_PROVIDER_SITE_OTHER): Payer: Private Health Insurance - Indemnity | Admitting: Obstetrics and Gynecology

## 2011-12-28 ENCOUNTER — Encounter: Payer: Self-pay | Admitting: Obstetrics and Gynecology

## 2011-12-28 VITALS — BP 100/62 | Ht 59.0 in | Wt 135.0 lb

## 2011-12-28 DIAGNOSIS — R87619 Unspecified abnormal cytological findings in specimens from cervix uteri: Secondary | ICD-10-CM

## 2011-12-28 DIAGNOSIS — R6889 Other general symptoms and signs: Secondary | ICD-10-CM

## 2011-12-28 DIAGNOSIS — IMO0002 Reserved for concepts with insufficient information to code with codable children: Secondary | ICD-10-CM

## 2011-12-28 DIAGNOSIS — R87810 Cervical high risk human papillomavirus (HPV) DNA test positive: Secondary | ICD-10-CM

## 2011-12-28 DIAGNOSIS — R87610 Atypical squamous cells of undetermined significance on cytologic smear of cervix (ASC-US): Secondary | ICD-10-CM

## 2011-12-28 NOTE — Progress Notes (Signed)
Patient ID: [name], female   DOB: 26-Oct-1965, 46 y.o.   MRN: [id number]  Chief Complaint  Patient presents with  . Gynecologic Exam    Colposcopy and biopsies/Pt was given 600 mg of Ibuprofen    HPI [name] is a 46 y.o. female.  Pap with ASCUS and +HRHPV HPI  Review of Systems Review of Systems  Blood pressure 100/62, height 4\' 11"  (1.499 m), weight 135 lb (61.236 kg), last menstrual period 12/12/2011.  Physical Exam Physical Exam  AW lesion at 9-10 o'clock   Assessment    Procedure Details  The risks and benefits of the procedure and Written informed consent obtained.  Speculum placed in vagina and excellent visualization of cervix achieved, cervix swabbed x 3 with acetic acid solution.  Specimens: Bx at 10 O'Clock  Complications: none.     Plan    Specimens labelled and sent to Pathology. Return to discuss Pathology results in 2 weeks.      Purcell Nails 12/28/2011, 5:39 PM

## 2012-01-01 ENCOUNTER — Telehealth: Payer: Self-pay

## 2012-01-01 NOTE — Telephone Encounter (Signed)
Pt was calling for path results. Everthing looks good per Caryl Ada, CMA

## 2012-01-13 ENCOUNTER — Telehealth: Payer: Self-pay

## 2012-01-13 NOTE — Telephone Encounter (Signed)
Pt was scheduled for 02/02/2012 @ 4:15 for Colpo results F/U. [name]

## 2012-01-13 NOTE — Telephone Encounter (Signed)
Left message for pt to call regarding appt to be scheduled for Colpo F/U. Mathis Bud

## 2012-01-25 ENCOUNTER — Encounter: Payer: Private Health Insurance - Indemnity | Admitting: Obstetrics and Gynecology

## 2012-02-02 ENCOUNTER — Encounter: Payer: Self-pay | Admitting: Obstetrics and Gynecology

## 2012-02-02 ENCOUNTER — Ambulatory Visit (INDEPENDENT_AMBULATORY_CARE_PROVIDER_SITE_OTHER): Payer: Private Health Insurance - Indemnity | Admitting: Obstetrics and Gynecology

## 2012-02-02 VITALS — BP 106/74 | Resp 14 | Ht <= 58 in | Wt 137.0 lb

## 2012-02-02 DIAGNOSIS — IMO0002 Reserved for concepts with insufficient information to code with codable children: Secondary | ICD-10-CM

## 2012-02-02 DIAGNOSIS — R87811 Vaginal high risk human papillomavirus (HPV) DNA test positive: Secondary | ICD-10-CM

## 2012-02-02 MED ORDER — ZOLPIDEM TARTRATE 10 MG PO TABS: 10.0000 mg | ORAL_TABLET | Freq: Every evening | ORAL | Status: DC | PRN

## 2012-02-02 NOTE — Progress Notes (Signed)
Difficulty sleeping requests [name] Vitals:   02/02/12 1659  BP: 106/74  Resp: 14   Report Comments: FINAL DIAGNOSIS: A. Cervix- Biopsy, 10 o'clock:  Benign cervical mucosa with focal koilocytic atypia.  No definite dysplasia is identified. See comment. B. Endocervix - Curettage:  Fragments of benign endocervical mucosa.  Negative for atypia. See comment.  A/P Bx results reviewed Rx ambien RTO 4-84mths for repeat pap

## 2012-02-07 DIAGNOSIS — IMO0002 Reserved for concepts with insufficient information to code with codable children: Secondary | ICD-10-CM | POA: Insufficient documentation

## 2012-02-07 HISTORY — DX: Reserved for concepts with insufficient information to code with codable children: IMO0002

## 2012-02-19 ENCOUNTER — Encounter: Payer: Self-pay | Admitting: *Deleted

## 2012-02-22 ENCOUNTER — Ambulatory Visit (INDEPENDENT_AMBULATORY_CARE_PROVIDER_SITE_OTHER): Payer: Private Health Insurance - Indemnity | Admitting: Physician Assistant

## 2012-02-22 ENCOUNTER — Encounter: Payer: Self-pay | Admitting: Physician Assistant

## 2012-02-22 VITALS — BP 107/67 | HR 84 | Ht 59.0 in | Wt 133.0 lb

## 2012-02-22 DIAGNOSIS — R002 Palpitations: Secondary | ICD-10-CM

## 2012-02-22 DIAGNOSIS — R079 Chest pain, unspecified: Secondary | ICD-10-CM

## 2012-02-22 MED ORDER — METOPROLOL TARTRATE 50 MG PO TABS: 50.0000 mg | ORAL_TABLET | Freq: Two times a day (BID) | ORAL | Status: DC

## 2012-02-22 NOTE — Progress Notes (Signed)
[address] [place], [place]  [address] Phone: [phone] Fax:  [phone]  Date:  02/22/2012   Name:  [name]   DOB:  April 15, 1966   MRN:  [id number]  PCP:  Purcell Nails, MD  Primary Cardiologist/Primary Electrophysiologist:  Dr. Lewayne Bunting    History of Present Illness: [name] is a 46 y.o. female who returns for follow up.  She has a history of chest pain and SVT which has been controlled on medical therapy.  She last saw Dr. Ladona Ridgel in 11/2009.  Echocardiogram 10/2002: EF 70%, mild PI.  EP study 10/2002 demonstrated SVT with multiple mechanisms.  Myoview 12/2008: EF 70%, no ischemia.  Patient started experiencing a myriad of side effects to Flecainide several mos ago.  She stopped and started this on her own and did note an improvement without the Flecainide.  She states she starting taking her mother's medication.  At first, she stated that it was also Flecainide.  However, upon further questioning, she admits she does not know what drug it is and she takes it infrequently.  She does feel better with it.  Overall, her palpitations seem to be worse.  She notes dyspnea with more extreme activities. She notes atypical chest pain at times.  It occurs at rest.  She works out at [employer] and denies exertional chest pain.  She works out on the [employer], television/film set.  She denise orthopnea, PND, edema.  No syncope.  No pleuritic CP.  No CP with lying supine.     Past Medical History  Diagnosis Date  . History of uterine fibroid   . Menorrhagia   . Dysmenorrhea 2011  . Difficulty sleeping   . Chest pain   . Supraventricular tachycardia   . Heart palpitations     Current Outpatient Prescriptions  Medication Sig Dispense Refill  . metoprolol (LOPRESSOR) 50 MG tablet Take 25 mg by mouth 2 (two) times daily.      Marland Kitchen zolpidem (AMBIEN) 10 MG tablet Take 1 tablet (10 mg total) by mouth at bedtime as needed for sleep.  30 tablet  0    Allergies: No  Known Allergies  History  Substance Use Topics  . Smoking status: Former Smoker    Types: Cigarettes    Quit date: 12/27/1984  . Smokeless tobacco: Never Used  . Alcohol Use: Yes     ROS:  Please see the history of present illness.    All other systems reviewed and negative.   PHYSICAL EXAM: VS:  BP 107/67  Pulse 84  Ht 4\' 11"  (1.499 m)  Wt 133 lb (60.328 kg)  BMI 26.86 kg/m2  LMP 01/11/2012 Well nourished, well developed, in no acute distress HEENT: normal Neck: no JVD Cardiac:  normal S1, S2; RRR; no murmur Lungs:  clear to auscultation bilaterally, no wheezing, rhonchi or rales Abd: soft, nontender, no hepatomegaly Ext: no edema Skin: warm and dry Neuro:  CNs 2-12 intact, no focal abnormalities noted  EKG:  NSR, HR 76, normal axis      ASSESSMENT AND PLAN:  1.  Palpitations She has a h/o SVT. She has had recent onset of SE's to Flecainide. I would recommend she just remain off this for now.  She should also stop taking her mom's medication. I will increase metoprolol to 50 mg bid. Arrange an echo and event monitor. Will also arrange a BMET and TSH. Follow up with Dr. Lewayne Bunting after her event monitor is complete.  2.  Chest pain Atypical. Check echo and event monitor. At this point, do not think she needs stress testing. CP likely related to palpitations.    Signed, Tereso Newcomer, PA-C  4:12 PM 02/22/2012

## 2012-02-22 NOTE — Patient Instructions (Addendum)
Your physician has requested that you have an echocardiogram DX 785.1. Echocardiography is a painless test that uses sound waves to create images of your heart. It provides your doctor with information about the size and shape of your heart and how well your heart's chambers and valves are working. This procedure takes approximately one hour. There are no restrictions for this procedure.  Your physician has recommended that you wear an event monitor DX 785.1r. Event monitors are medical devices that record the heart's electrical activity. Doctors most often [place] these monitors to diagnose arrhythmias. Arrhythmias are problems with the speed or rhythm of the heartbeat. The monitor is a small, portable device. You can wear one while you do your normal daily activities. This is usually used to diagnose what is causing palpitations/syncope (passing out).  Your physician has recommended you make the following change in your medication: INCREASE METOPROLOL TARTRATE TO 50 MG TWICE DAILY  STOP TAKING YOUR MOM'S MEDICATION AND PLEASE CALL [phone] SCOTT WEAVER, PAC TO LET us KNOW WHICH MEDICATIONS YOU HAVE BEEN TAKING OF YOUR MOTHERS  APPROX 4 WEEKS WITH DR. Ladona Ridgel

## 2012-02-24 ENCOUNTER — Telehealth: Payer: Self-pay | Admitting: *Deleted

## 2012-02-24 NOTE — Telephone Encounter (Signed)
  lmom ptcb to tell pt when she comes in for echo and montior that we will get tsh and bmet per [employer]. PAC

## 2012-03-08 ENCOUNTER — Encounter (INDEPENDENT_AMBULATORY_CARE_PROVIDER_SITE_OTHER): Payer: Medicare FFS

## 2012-03-08 ENCOUNTER — Ambulatory Visit (INDEPENDENT_AMBULATORY_CARE_PROVIDER_SITE_OTHER): Payer: Medicare FFS | Admitting: *Deleted

## 2012-03-08 ENCOUNTER — Ambulatory Visit (HOSPITAL_COMMUNITY): Payer: Medicare FFS | Attending: Cardiovascular Disease

## 2012-03-08 DIAGNOSIS — I498 Other specified cardiac arrhythmias: Secondary | ICD-10-CM

## 2012-03-08 DIAGNOSIS — R079 Chest pain, unspecified: Secondary | ICD-10-CM

## 2012-03-08 DIAGNOSIS — Z87891 Personal history of nicotine dependence: Secondary | ICD-10-CM | POA: Insufficient documentation

## 2012-03-08 DIAGNOSIS — R002 Palpitations: Secondary | ICD-10-CM

## 2012-03-08 DIAGNOSIS — R072 Precordial pain: Secondary | ICD-10-CM

## 2012-03-08 NOTE — Progress Notes (Signed)
Echocardiogram performed.  

## 2012-03-09 ENCOUNTER — Telehealth: Payer: Self-pay | Admitting: *Deleted

## 2012-03-09 LAB — BASIC METABOLIC PANEL
BUN: 8 mg/dL (ref 6–23)
CO2: 26 mEq/L (ref 19–32)
Chloride: 105 mEq/L (ref 96–112)
Creatinine, Ser: 0.5 mg/dL (ref 0.4–1.2)
Potassium: 3.6 mEq/L (ref 3.5–5.1)

## 2012-03-09 NOTE — Telephone Encounter (Signed)
Message copied by Tarri Fuller on Wed Mar 09, 2012 11:58 AM ------      Message from: [place], [place] T      Created: Wed Mar 09, 2012 11:21 AM       Echo ok with      Normal LV function.      Tereso Newcomer, PA-C 03/09/2012 11:20 AM

## 2012-03-09 NOTE — Telephone Encounter (Signed)
lmom echo normal 

## 2012-04-21 ENCOUNTER — Encounter: Payer: Self-pay | Admitting: Internal Medicine

## 2012-04-21 ENCOUNTER — Ambulatory Visit (INDEPENDENT_AMBULATORY_CARE_PROVIDER_SITE_OTHER): Payer: Medicare FFS | Admitting: Internal Medicine

## 2012-04-21 VITALS — BP 106/68 | HR 80 | Ht 59.0 in | Wt 135.0 lb

## 2012-04-21 DIAGNOSIS — R079 Chest pain, unspecified: Secondary | ICD-10-CM

## 2012-04-21 DIAGNOSIS — I498 Other specified cardiac arrhythmias: Secondary | ICD-10-CM

## 2012-04-21 NOTE — Progress Notes (Signed)
HPI [name] returns today for followup. She is a pleasant 46 year old woman with a history of tachycardia palpitations, documented SVT, associated with chest pain and shortness of breath. I saw her last over a year ago at that time her symptoms were fairly well-controlled. She was taking flecainide. Since then she has stopped flecainide and she's had occasional episodes particularly in the last 2-3 months where her palpitations have recurred. She were cardiac monitor ordered by Mr. Tereso Newcomer. This demonstrated several episodes of SVT at rates of 165 beats per minute. She has not had syncope but during SVT, she experienced chest pain. The spells start and stop suddenly. No peripheral edema. No Known Allergies   Current Outpatient Prescriptions  Medication Sig Dispense Refill  . metoprolol (LOPRESSOR) 50 MG tablet Take 1 tablet (50 mg total) by mouth 2 (two) times daily.  60 tablet  11     Past Medical History  Diagnosis Date  . History of uterine fibroid   . Menorrhagia   . Dysmenorrhea 2011  . Difficulty sleeping   . Chest pain   . Supraventricular tachycardia   . Heart palpitations     ROS:   All systems reviewed and negative except as noted in the HPI.   Past Surgical History  Procedure Date  . Tubal ligation   . Hysteroscopy      No family history on file.   History   Social History  . Marital Status: Legally Separated    Spouse Name: N/A    Number of Children: N/A  . Years of Education: N/A   Occupational History  . Not on file.   Social History Main Topics  . Smoking status: Former Smoker    Types: Cigarettes    Quit date: 12/27/1984  . Smokeless tobacco: Never Used  . Alcohol Use: Yes  . Drug Use: No  . Sexually Active: Not on file   Other Topics Concern  . Not on file   Social History Narrative  . No narrative on file     BP 106/68  Pulse 80  Ht 4\' 11"  (1.499 m)  Wt 135 lb (61.236 kg)  BMI 27.27 kg/m2  Physical Exam:  Well  appearing middle-aged woman, NAD HEENT: Unremarkable Neck:  No JVD, no thyromegally Lungs:  Clear with no wheezes, rales, or rhonchi. HEART:  Regular rate rhythm, no murmurs, no rubs, no clicks Abd:  soft, positive bowel sounds, no organomegally, no rebound, no guarding Ext:  2 plus pulses, no edema, no cyanosis, no clubbing Skin:  No rashes no nodules Neuro:  CN II through XII intact, motor grossly intact  EKG Normal sinus rhythm with normal axis and intervals.  Assess/Plan:

## 2012-04-21 NOTE — Assessment & Plan Note (Signed)
Her chest pain is associated with tachycardia palpitations. She has not had chest pain except with her racing heart. She will undergo watchful waiting and continue her beta blocker. Low risk for coronary disease.

## 2012-04-21 NOTE — Patient Instructions (Addendum)
Your physician recommends that you schedule a follow-up appointment as needed  Please call if you decided to go thru with the ablation -- [phone]

## 2012-04-21 NOTE — Assessment & Plan Note (Signed)
She has had recurrent symptoms in the last few months. She is on medical therapy with minimal improvement. We discussed the risk, goals, benefits, and expectations of catheter ablation. She is considering proceeding with ablation but will call us if she wishes so.

## 2012-04-25 ENCOUNTER — Emergency Department (HOSPITAL_COMMUNITY)
Admission: EM | Admit: 2012-04-25 | Discharge: 2012-04-25 | Disposition: A | Discharge status: Home / Self Care | Payer: Medicare FFS | Source: Home / Self Care | Attending: Family Medicine | Admitting: Family Medicine

## 2012-04-25 ENCOUNTER — Encounter (HOSPITAL_COMMUNITY): Payer: Self-pay | Admitting: *Deleted

## 2012-04-25 DIAGNOSIS — N390 Urinary tract infection, site not specified: Secondary | ICD-10-CM

## 2012-04-25 LAB — POCT URINALYSIS DIP (DEVICE)
Hgb urine dipstick: NEGATIVE
Nitrite: NEGATIVE
Specific Gravity, Urine: >=1.03 (ref 1.005–1.030)
Urobilinogen, UA: 0.2 mg/dL (ref 0.0–1.0)
pH: 5.5 (ref 5.0–8.0)

## 2012-04-25 MED ORDER — CEPHALEXIN 500 MG PO CAPS: 500.0000 mg | ORAL_CAPSULE | Freq: Four times a day (QID) | ORAL | Status: DC

## 2012-04-25 NOTE — ED Provider Notes (Signed)
History     CSN: [id number]  Arrival date & time 04/25/12  1440   First MD Initiated Contact with Patient 04/25/12 1441      Chief Complaint  Patient presents with  . Urinary Tract Infection    (Consider location/radiation/quality/duration/timing/severity/associated sxs/prior treatment) Patient is a 46 y.o. female presenting with urinary tract infection. The history is provided by the patient.  Urinary Tract Infection This is a new problem. The current episode started more than 2 days ago. The problem has been gradually worsening. Associated symptoms include abdominal pain.    Past Medical History  Diagnosis Date  . History of uterine fibroid   . Menorrhagia   . Dysmenorrhea 2011  . Difficulty sleeping   . Chest pain   . Supraventricular tachycardia   . Heart palpitations     Past Surgical History  Procedure Date  . Tubal ligation   . Hysteroscopy     No family history on file.  History  Substance Use Topics  . Smoking status: Former Smoker    Types: Cigarettes    Quit date: 12/27/1984  . Smokeless tobacco: Never Used  . Alcohol Use: Yes    OB History    Grav Para Term Preterm Abortions TAB SAB Ect Mult Living   3        1 4       Review of Systems  Gastrointestinal: Positive for abdominal pain. Negative for nausea, vomiting and diarrhea.  Genitourinary: Positive for dysuria, urgency and frequency. Negative for flank pain, vaginal bleeding, vaginal discharge, vaginal pain and menstrual problem.    Allergies  Review of patient's allergies indicates no known allergies.  Home Medications   Current Outpatient Rx  Name Route Sig Dispense Refill  . CEPHALEXIN 500 MG PO CAPS Oral Take 1 capsule (500 mg total) by mouth 4 (four) times daily. Take all of medicine and drink lots of fluids 20 capsule 0  . METOPROLOL TARTRATE 50 MG PO TABS Oral Take 1 tablet (50 mg total) by mouth 2 (two) times daily. 60 tablet 11    BP 118/68  Pulse 72  Temp 97.9 F (36.6  C) (Oral)  Resp 18  SpO2 100%  LMP 04/11/2012  Physical Exam  Nursing note and vitals reviewed. Constitutional: She is oriented to person, place, and time. She appears well-developed and well-nourished.  Abdominal: Soft. Bowel sounds are normal. She exhibits no distension and no mass. There is no tenderness. There is no rebound, no guarding and no CVA tenderness.  Neurological: She is alert and oriented to person, place, and time.  Skin: Skin is warm and dry.  Psychiatric: She has a normal mood and affect.    ED Course  Procedures (including critical care time)  Labs Reviewed  POCT URINALYSIS DIP (DEVICE) - Abnormal; Notable for the following:    Bilirubin Urine SMALL (*)     Leukocytes, UA SMALL (*)  Biochemical Testing Only. Please order routine urinalysis from main lab if confirmatory testing is needed.   All other components within normal limits   No results found.   1. UTI (lower urinary tract infection)       MDM          Linna Hoff, MD 04/25/12 1610

## 2012-04-25 NOTE — ED Notes (Signed)
Pt  Reports  Symptoms  Of  Frequency   Burning  Discomfort  And    Not  Emptying bladder  Completely   Symptoms  X   4   Days           denys  Any  Bleeding or  Discharge

## 2012-04-26 NOTE — ED Notes (Signed)
Pain  Scale  Of  3   Burning  In  nature

## 2012-05-30 ENCOUNTER — Other Ambulatory Visit: Payer: Self-pay | Admitting: *Deleted

## 2012-05-30 DIAGNOSIS — R002 Palpitations: Secondary | ICD-10-CM

## 2012-05-30 MED ORDER — METOPROLOL TARTRATE 50 MG PO TABS: 50.0000 mg | ORAL_TABLET | Freq: Two times a day (BID) | ORAL | Status: DC

## 2012-08-16 ENCOUNTER — Ambulatory Visit: Payer: Self-pay | Admitting: Obstetrics and Gynecology

## 2012-09-09 ENCOUNTER — Encounter: Payer: Self-pay | Admitting: Gynecology

## 2012-11-07 ENCOUNTER — Ambulatory Visit: Payer: Self-pay | Admitting: Gynecology

## 2012-11-14 ENCOUNTER — Ambulatory Visit: Payer: Self-pay | Admitting: Gynecology

## 2012-11-17 ENCOUNTER — Ambulatory Visit: Payer: Self-pay | Admitting: Gynecology

## 2012-11-17 ENCOUNTER — Encounter: Payer: Self-pay | Admitting: Gynecology

## 2012-11-17 ENCOUNTER — Ambulatory Visit (INDEPENDENT_AMBULATORY_CARE_PROVIDER_SITE_OTHER): Payer: Managed Care, Other (non HMO) | Admitting: Gynecology

## 2012-11-17 VITALS — BP 118/60 | Ht 59.5 in | Wt 136.0 lb

## 2012-11-17 DIAGNOSIS — Z8742 Personal history of other diseases of the female genital tract: Secondary | ICD-10-CM

## 2012-11-17 DIAGNOSIS — N946 Dysmenorrhea, unspecified: Secondary | ICD-10-CM | POA: Insufficient documentation

## 2012-11-17 DIAGNOSIS — B977 Papillomavirus as the cause of diseases classified elsewhere: Secondary | ICD-10-CM

## 2012-11-17 DIAGNOSIS — Z87898 Personal history of other specified conditions: Secondary | ICD-10-CM

## 2012-11-17 NOTE — Progress Notes (Signed)
Pt here for repeat PAP not done at initial visit.  Pt with history of ASCUS, negative colposcopy by another provider.  Pt was also offered Mirena IUD for her dysmenorrhea.  She has 3 small fibroids by u/s, largest 1.4cm. Today pt reports cycles 2d heavy, stops after 3d. Changes tampons 5-6/d, some break thru bleeding.  Pt reports cramps usually 3d before using aleve but not having relief.  Pt is currently on cycles, occasional dysparuenia   ROS: Review of Systems - per HPI  PE: Pelvic exam:  VULVA: normal appearing vulva with no masses, tenderness or lesions,  VAGINA: normal appearing vagina with normal color and discharge, no lesions,  CERVIX: normal appearing cervix without discharge or lesions.   Assessment: H/O ASCUS, HR HRV, negative colposcopy dysmenorreha  Plan: PAP with co-testing today,triage based on results, if normal will place IUD for dysmenorrhea with upcoming cycle. Had brief discussion re HPV transmission and affects

## 2012-11-17 NOTE — Patient Instructions (Signed)
Abnormal Pap Test Information  During a Pap test, the cells on the surface of your cervix are checked to see if they look normal, abnormal, or if they show signs of having been altered by a certain type of virus called human papillomavirus, or HPV. Cervical cells that have been affected by HPV are called dysplasia. Dysplasia is not cancer, but describes abnormal cells found on the surface of the cervix. Depending on the degree of dysplasia, some of the cells may be considered pre-cancerous and may turn into cancer over time if follow up with a caregiver is delayed.   WHAT DOES AN ABNORMAL PAP TEST MEAN?  Having an abnormal pap test does not mean that you have cancer. However, certain types of abnormal pap tests can be a sign that a person is at a higher risk of developing cancer. Your caregiver will want to do other tests to find out more about the abnormal cells. Your abnormal Pap test results could show:   · Small and uncertain changes that should be carefully watched.    · Cervical dysplasia that has caused mild changes and can be followed over time.    · Cervical dysplasia that is more severe and needs to be followed and treated to ensure the problem goes away.  · Cancer.    When severe cervical dysplasia is found and treated early, it rarely will grow into cancer.   WHAT WILL BE DONE ABOUT MY ABNORMAL PAP TEST?  · A colposcopy may be needed. This is a procedure where your cervix is examined using light and magnification.  · A small tissue sample of your cervix (biopsy) may need to be removed and then examined. This is often performed if there are areas that appear infected.  · A sample of cells from the cervical canal may be removed with either a small brush or scraping instrument (curette).  Based on the results of the procedures above, some caregivers may recommend either cryotherapy of the cervix or a surgical LEEP where a portion of the cervix is removed. LEEP is short for "loop electrical excisional  procedure." Rarely, a caregiver may recommend a cone biopsy. This is a procedure where a small, cone-shaped sample of your cervix is taken out. The part that is taken out is the area where the abnormal cells are.    WHAT IF I HAVE A DYSPLASIA OR A CANCER?  You may be referred to a specialist. Radiation may also be a treatment for more advanced cancer. Having a hysterectomy is the last treatment option for dysplasia, but it is a more common treatment for someone with cancer. All treatment options will be discussed with you by your caregiver.  WHAT SHOULD YOU DO AFTER BEING TREATED?  If you have had an abnormal pap test, you should continue to have regular pap tests and check-ups as directed by your caregiver. Your cervical problem will be carefully watched so it does not get worse. Also, your caregiver can watch for, and treat, any new problems that may come up.  Document Released: 10/28/2010 Document Revised: 10/05/2011 Document Reviewed: 07/09/2011  [employer]® Patient Information ©2013 [employer].

## 2012-11-21 ENCOUNTER — Encounter (HOSPITAL_COMMUNITY): Payer: Self-pay | Admitting: *Deleted

## 2012-11-21 ENCOUNTER — Emergency Department (HOSPITAL_COMMUNITY): Payer: Medicare FFS

## 2012-11-21 ENCOUNTER — Emergency Department (HOSPITAL_COMMUNITY)
Admission: EM | Admit: 2012-11-21 | Discharge: 2012-11-21 | Disposition: A | Discharge status: Home / Self Care | Payer: Medicare FFS | Attending: Emergency Medicine | Admitting: Emergency Medicine

## 2012-11-21 DIAGNOSIS — Z87891 Personal history of nicotine dependence: Secondary | ICD-10-CM | POA: Insufficient documentation

## 2012-11-21 DIAGNOSIS — R5381 Other malaise: Secondary | ICD-10-CM | POA: Insufficient documentation

## 2012-11-21 DIAGNOSIS — R079 Chest pain, unspecified: Secondary | ICD-10-CM | POA: Insufficient documentation

## 2012-11-21 DIAGNOSIS — Z8742 Personal history of other diseases of the female genital tract: Secondary | ICD-10-CM | POA: Insufficient documentation

## 2012-11-21 DIAGNOSIS — R002 Palpitations: Secondary | ICD-10-CM

## 2012-11-21 DIAGNOSIS — R0602 Shortness of breath: Secondary | ICD-10-CM | POA: Insufficient documentation

## 2012-11-21 DIAGNOSIS — Z8669 Personal history of other diseases of the nervous system and sense organs: Secondary | ICD-10-CM | POA: Insufficient documentation

## 2012-11-21 DIAGNOSIS — Z79899 Other long term (current) drug therapy: Secondary | ICD-10-CM | POA: Insufficient documentation

## 2012-11-21 DIAGNOSIS — I498 Other specified cardiac arrhythmias: Secondary | ICD-10-CM | POA: Insufficient documentation

## 2012-11-21 DIAGNOSIS — R42 Dizziness and giddiness: Secondary | ICD-10-CM | POA: Insufficient documentation

## 2012-11-21 LAB — CBC
HCT: 34.2 % — ABNORMAL LOW (ref 36.0–46.0)
MCHC: 36 g/dL (ref 30.0–36.0)
MCV: 64.2 fL — ABNORMAL LOW (ref 78.0–100.0)
RDW: 16.9 % — ABNORMAL HIGH (ref 11.5–15.5)

## 2012-11-21 LAB — BASIC METABOLIC PANEL
BUN: 6 mg/dL (ref 6–23)
Chloride: 105 mEq/L (ref 96–112)
Creatinine, Ser: 0.64 mg/dL (ref 0.50–1.10)
GFR calc Af Amer: >90 mL/min (ref >90)
GFR calc non Af Amer: >90 mL/min (ref >90)

## 2012-11-21 LAB — POCT I-STAT TROPONIN I

## 2012-11-21 NOTE — ED Notes (Signed)
Patient transported to X-ray 

## 2012-11-21 NOTE — ED Provider Notes (Signed)
History     CSN: [id number]  Arrival date & time 11/21/12  1191   First MD Initiated Contact with Patient 11/21/12 [phone]      Chief Complaint  Patient presents with  . Palpitations  . Chest Pain    (Consider location/radiation/quality/duration/timing/severity/associated sxs/prior treatment) HPI Comments: Pt presenting to the ED for short episode of palpitations and chest pain earlier this am.  Pain described as a heaviness and pressure localized over the left side of her chest, associated with palpitations, dizziness, weakness, and mild SOB.  All sx were short lived and have completely resolved at this time.  Pt states she used to take [facility] but has been off of it for several months.  Pt is unsure what type of arrhythmia she had and has not seen her cardiologist, Dr. Ladona Ridgel, in quite a while.  Denies any other recent episodes of chest pain or palpitations like this.  No hx of MI or stroke.  Other notes reviewed- Last cardiology office visit with Dr. Ladona Ridgel 04/21/12.  Holter monitor recorded several short runs of SVT at approx 165 bpm.  Pt was continue metoprolol and consider cardiac ablation.    The history is provided by the patient.    Past Medical History  Diagnosis Date  . History of uterine fibroid   . Menorrhagia   . Dysmenorrhea 2011  . Difficulty sleeping   . Chest pain   . Supraventricular tachycardia   . Heart palpitations     Past Surgical History  Procedure Laterality Date  . Tubal ligation    . Hysteroscopy    . Breast surgery    . Combined augmentation mammaplasty and abdominoplasty Bilateral 2002    No family history on file.  History  Substance Use Topics  . Smoking status: Former Smoker    Types: Cigarettes    Quit date: 12/27/1984  . Smokeless tobacco: Never Used  . Alcohol Use: Yes    OB History   Grav Para Term Preterm Abortions TAB SAB Ect Mult Living   3        1 4       Review of Systems  Cardiovascular: Positive for palpitations.   All other systems reviewed and are negative.    Allergies  Review of patient's allergies indicates no known allergies.  Home Medications   Current Outpatient Rx  Name  Route  Sig  Dispense  Refill  . metoprolol (LOPRESSOR) 50 MG tablet   Oral   Take 50 mg by mouth as needed.           BP 109/68  Pulse 71  Temp(Src) 98.3 F (36.8 C)  Resp 18  SpO2 99%  LMP 11/01/2012  Physical Exam  Nursing note and vitals reviewed. Constitutional: She is oriented to person, place, and time. She appears well-developed and well-nourished.  HENT:  Head: Normocephalic and atraumatic.  Mouth/Throat: Oropharynx is clear and moist.  Eyes: Conjunctivae and EOM are normal. Pupils are equal, round, and reactive to light.  Neck: Normal range of motion.  Cardiovascular: Normal rate, regular rhythm and normal heart sounds.   Distal pulses intact  Pulmonary/Chest: Effort normal and breath sounds normal.  Abdominal: Soft. Bowel sounds are normal. There is no tenderness.  Musculoskeletal: Normal range of motion. She exhibits no edema.  Neurological: She is alert and oriented to person, place, and time. She has normal strength. No cranial nerve deficit or sensory deficit.  Equal strength UE and LE bilaterally, no acute neuro deficits or facial droop  appreciated.  Skin: Skin is warm and dry.  Psychiatric: She has a normal mood and affect.    ED Course  Procedures (including critical care time)   Date: 11/21/2012  Rate: 68  Rhythm: normal sinus rhythm  QRS Axis: normal  Intervals: normal  ST/T Wave abnormalities: normal  Conduction Disutrbances:none  Narrative Interpretation: normal EKG, no STEMI  Old EKG Reviewed: unchanged    Labs Reviewed  CBC - Abnormal; Notable for the following:    RBC 5.33 (*)    HCT 34.2 (*)    MCV 64.2 (*)    MCH 23.1 (*)    RDW 16.9 (*)    All other components within normal limits  BASIC METABOLIC PANEL - Abnormal; Notable for the following:    Glucose,  Bld 116 (*)    All other components within normal limits  POCT I-STAT TROPONIN I  POCT I-STAT TROPONIN I   Dg Chest 2 View  11/21/2012  *RADIOLOGY REPORT*  Clinical Data: Chest pain and palpitations  CHEST - 2 VIEW  Comparison:  March 23, 2011  Findings:  The lungs are clear.  The heart size and pulmonary vascularity are normal.  No adenopathy.  No bone lesions.  No pneumothorax.  IMPRESSION: No abnormality noted.   Original Report Authenticated By: Bretta Bang, M.D.      1. Palpitations       MDM   Pt presenting to the ED for episode of palpitations and chest pain earlier this am.  Sx short lived and completely resolved PTA.  Pt has hx of documented SVT, previously controlled with Flecainade, now on metoprolol.  Pt remained asx in the ED.  EKG NSR, trop x2 negative.  Labs largely WNL.  Low suspicion for ACS, PE, or aortic dissection- palpitations likely an episode of SVT.  FU with cardiology- Dr. Ladona Ridgel.  Information given on cardiac ablation which she is considering at this time. Discussed plan with pt- she agreed.  Discussed with Dr. Ranae Palms who agrees with plan.  Return precautions advised.     Garlon Hatchet, PA-C 11/22/12 1355

## 2012-11-21 NOTE — ED Notes (Signed)
Pt states palpitations and then developed crushing chest pain that has resolved.  Pt has been off of flecainide for 10years and unsure last time she has seen dr. Ladona Ridgel.  Pt unsure what type of arrythmia she had.  Pt did have sob with chest pain

## 2012-11-22 NOTE — ED Provider Notes (Signed)
Medical screening examination/treatment/procedure(s) were performed by non-physician practitioner and as supervising physician I was immediately available for consultation/collaboration.   Loren Racer, MD 11/22/12 [phone]

## 2012-12-12 ENCOUNTER — Telehealth: Payer: Self-pay | Admitting: *Deleted

## 2012-12-12 NOTE — Telephone Encounter (Signed)
Patient returning call to [facility], advised that ins estimate covers IUD without a copay.  To call with menses if desires insertion.

## 2012-12-22 ENCOUNTER — Encounter: Payer: Self-pay | Admitting: *Deleted

## 2012-12-22 ENCOUNTER — Ambulatory Visit (INDEPENDENT_AMBULATORY_CARE_PROVIDER_SITE_OTHER): Payer: Medicare FFS | Admitting: Internal Medicine

## 2012-12-22 ENCOUNTER — Other Ambulatory Visit: Payer: Self-pay | Admitting: *Deleted

## 2012-12-22 ENCOUNTER — Encounter: Payer: Self-pay | Admitting: Internal Medicine

## 2012-12-22 VITALS — BP 109/68 | HR 66 | Ht 59.0 in | Wt 139.0 lb

## 2012-12-22 DIAGNOSIS — I471 Supraventricular tachycardia: Secondary | ICD-10-CM

## 2012-12-22 DIAGNOSIS — I498 Other specified cardiac arrhythmias: Secondary | ICD-10-CM

## 2012-12-22 LAB — BASIC METABOLIC PANEL
CO2: 24 mEq/L (ref 19–32)
Chloride: 110 mEq/L (ref 96–112)
Creatinine, Ser: 0.6 mg/dL (ref 0.4–1.2)

## 2012-12-22 LAB — CBC
HCT: 35 % — ABNORMAL LOW (ref 36.0–46.0)
Hemoglobin: 11.6 g/dL — ABNORMAL LOW (ref 12.0–15.0)
MCV: 70.3 fl — ABNORMAL LOW (ref 78.0–100.0)
RBC: 4.99 Mil/uL (ref 3.87–5.11)

## 2012-12-22 NOTE — Assessment & Plan Note (Signed)
The patient's symptoms have increased in frequency and severity. We discussed the risk, goals, benefits, and expectations of catheter ablation. Her symptoms have become more prolonged and severe. She would like to proceed with catheter ablation.

## 2012-12-22 NOTE — Progress Notes (Signed)
HPI [name] returns today for followup. She is a 47-year-old woman with a history of tachycardia palpitations which have increased in frequency and severity.  She underwent EP study over 10 years ago. At that time she was found to have nonsustained tachycardia with apparent multiple mechanisms. She was initially treated with flecainide and improvement. However, she became intolerant of this medication. Over the last 2 years, and particularly over the last few months, she has had increasingly frequent episodes of tachypalpitations which start suddenly and stop suddenly, and may last up to an hour in duration. She has not had syncope the episodes are associated with shortness of breath and chest pressure. She has been on beta blocker therapy with no improvement. No Known Allergies   Current Outpatient Prescriptions  Medication Sig Dispense Refill  . metoprolol (LOPRESSOR) 50 MG tablet Take 50 mg by mouth as needed.       No current facility-administered medications for this visit.     Past Medical History  Diagnosis Date  . History of uterine fibroid   . Menorrhagia   . Dysmenorrhea 2011  . Difficulty sleeping   . Chest pain   . Supraventricular tachycardia   . Heart palpitations     ROS:   All systems reviewed and negative except as noted in the HPI.   Past Surgical History  Procedure Laterality Date  . Tubal ligation    . Hysteroscopy    . Breast surgery    . Combined augmentation mammaplasty and abdominoplasty Bilateral 2002     No family history on file.   History   Social History  . Marital Status: Legally Separated    Spouse Name: N/A    Number of Children: N/A  . Years of Education: N/A   Occupational History  . Not on file.   Social History Main Topics  . Smoking status: Former Smoker    Types: Cigarettes    Quit date: 12/27/1984  . Smokeless tobacco: Never Used  . Alcohol Use: Yes  . Drug Use: No  . Sexually Active: Not on file   Other Topics  Concern  . Not on file   Social History Narrative  . No narrative on file     BP 109/68  Pulse 66  Ht 4' 11" (1.499 m)  Wt 139 lb (63.05 kg)  BMI 28.06 kg/m2  Physical Exam:  Well appearing middle-age woman,NAD HEENT: Unremarkable Neck:  No JVD, no thyromegally Lymphatics:  No adenopathy Back:  No CVA tenderness Lungs:  Clear with no wheezes, rales, or rhonchi. HEART:  Regular rate rhythm, no murmurs, no rubs, no clicks Abd:  soft, positive bowel sounds, no organomegally, no rebound, no guarding Ext:  2 plus pulses, no edema, no cyanosis, no clubbing Skin:  No rashes no nodules Neuro:  CN II through XII intact, motor grossly intact  EKG - normal sinus rhythm with normal axis and intervals. Cardiac monitoring - review of her cardiac monitor demonstrates SVT at 180 beats per minute.   Assess/Plan: 

## 2012-12-22 NOTE — Patient Instructions (Signed)

## 2012-12-27 ENCOUNTER — Ambulatory Visit (HOSPITAL_COMMUNITY)
Admission: RE | Admit: 2012-12-27 | Discharge: 2012-12-27 | Disposition: A | Discharge status: Home / Self Care | Payer: Medicare FFS | Source: Ambulatory Visit | Attending: Obstetrics and Gynecology | Admitting: Obstetrics and Gynecology

## 2012-12-27 ENCOUNTER — Other Ambulatory Visit: Payer: Self-pay | Admitting: Obstetrics and Gynecology

## 2012-12-27 ENCOUNTER — Encounter (HOSPITAL_COMMUNITY): Payer: Self-pay | Admitting: Pharmacy Technician

## 2012-12-27 DIAGNOSIS — Z1231 Encounter for screening mammogram for malignant neoplasm of breast: Secondary | ICD-10-CM

## 2012-12-28 ENCOUNTER — Telehealth: Payer: Self-pay | Admitting: Internal Medicine

## 2012-12-28 NOTE — Telephone Encounter (Signed)
New Problem  Pt states she is having surgery on 6/11 and is suppose to go back to work on June 18, and patient wants to know if she can go back to work on 6.23.14

## 2012-12-28 NOTE — Telephone Encounter (Signed)
Follow Up     Pt calling back following up on her mess from earlier. Please call.

## 2012-12-29 NOTE — Telephone Encounter (Signed)
F/u     Pt still waiting on a call

## 2012-12-30 NOTE — Telephone Encounter (Signed)
lmom for patient that I have tried to contact her for the last 3 days however the work number she provided does not have her listed in the [employer].  Also, the cell number she has listed does not identify her name.  I let her know she can ask for a work note at the hospital while there for the procedure but normal policy is 1 week.  She can call back if needed but she need to leave a number I can reach her

## 2013-01-03 ENCOUNTER — Telehealth: Payer: Self-pay | Admitting: Internal Medicine

## 2013-01-03 NOTE — Telephone Encounter (Signed)
Follow up  Pt is returning your call regarding her procedure for tomorrow. She said to give her a call back at work at [phone] ext [phone] or ext [phone]

## 2013-01-03 NOTE — Telephone Encounter (Signed)
Spoke with patient and she is aware to be at the hospital at 9:00am

## 2013-01-04 ENCOUNTER — Encounter (HOSPITAL_COMMUNITY)
Admission: RE | Disposition: A | Discharge status: Home / Self Care | Payer: Self-pay | Source: Ambulatory Visit | Attending: Internal Medicine

## 2013-01-04 ENCOUNTER — Ambulatory Visit (HOSPITAL_COMMUNITY)
Admission: RE | Admit: 2013-01-04 | Discharge: 2013-01-05 | Disposition: A | Discharge status: Home / Self Care | Payer: Managed Care, Other (non HMO) | Source: Ambulatory Visit | Attending: Internal Medicine | Admitting: Internal Medicine

## 2013-01-04 ENCOUNTER — Encounter (HOSPITAL_COMMUNITY): Payer: Self-pay | Admitting: General Practice

## 2013-01-04 DIAGNOSIS — I498 Other specified cardiac arrhythmias: Secondary | ICD-10-CM | POA: Insufficient documentation

## 2013-01-04 DIAGNOSIS — I471 Supraventricular tachycardia, unspecified: Secondary | ICD-10-CM | POA: Insufficient documentation

## 2013-01-04 HISTORY — DX: Shortness of breath: R06.02

## 2013-01-04 HISTORY — PX: ELECTROPHYSIOLOGY STUDY: SHX5467

## 2013-01-04 HISTORY — PX: SUPRAVENTRICULAR TACHYCARDIA ABLATION: SHX5492

## 2013-01-04 HISTORY — DX: Anemia, unspecified: D64.9

## 2013-01-04 SURGERY — ELECTROPHYSIOLOGY STUDY
Anesthesia: LOCAL

## 2013-01-04 MED ORDER — HYDROXYUREA 500 MG PO CAPS
ORAL_CAPSULE | ORAL | Status: AC
  Filled 2013-01-04: qty 1

## 2013-01-04 MED ORDER — ONDANSETRON HCL 4 MG/2ML IJ SOLN: 4.0000 mg | Freq: Four times a day (QID) | INTRAMUSCULAR | Status: DC | PRN

## 2013-01-04 MED ORDER — ACETAMINOPHEN 325 MG PO TABS: 650.0000 mg | ORAL_TABLET | ORAL | Status: DC | PRN

## 2013-01-04 MED ORDER — FENTANYL CITRATE 0.05 MG/ML IJ SOLN
INTRAMUSCULAR | Status: AC
  Filled 2013-01-04: qty 2

## 2013-01-04 MED ORDER — MIDAZOLAM HCL 5 MG/5ML IJ SOLN
INTRAMUSCULAR | Status: AC
  Filled 2013-01-04: qty 5

## 2013-01-04 MED ORDER — SODIUM CHLORIDE 0.9 % IJ SOLN
3.0000 mL | INTRAMUSCULAR | Status: DC | PRN
  Administered 2013-01-04: 3 mL via INTRAVENOUS

## 2013-01-04 MED ORDER — SODIUM CHLORIDE 0.9 % IJ SOLN: 3.0000 mL | Freq: Two times a day (BID) | INTRAMUSCULAR | Status: DC

## 2013-01-04 MED ORDER — DEXTROSE 5 % IV SOLN
INTRAVENOUS | Status: AC
  Filled 2013-01-04: qty 250

## 2013-01-04 MED ORDER — BUPIVACAINE HCL (PF) 0.25 % IJ SOLN
INTRAMUSCULAR | Status: AC
  Filled 2013-01-04: qty 60

## 2013-01-04 MED ORDER — SODIUM CHLORIDE 0.9 % IV SOLN: 250.0000 mL | INTRAVENOUS | Status: DC | PRN

## 2013-01-04 MED ORDER — METOPROLOL TARTRATE 25 MG PO TABS: 50.0000 mg | ORAL_TABLET | Freq: Every evening | ORAL | Status: DC | PRN

## 2013-01-04 MED ORDER — OFF THE BEAT BOOK
Freq: Once | Status: AC
  Administered 2013-01-04: 21:00:00
  Filled 2013-01-04: qty 1

## 2013-01-04 MED ORDER — PROPAFENONE HCL 225 MG PO TABS
225.0000 mg | ORAL_TABLET | Freq: Two times a day (BID) | ORAL | Status: DC
  Administered 2013-01-04 – 2013-01-05 (×2): 225 mg via ORAL
  Filled 2013-01-04 (×4): qty 1

## 2013-01-04 NOTE — Progress Notes (Signed)
Brooke PA called and notified of short burst of SVT in 120s lasting less than 6 seconds several times, asymptomatic, no new orders given, Berle Mull RN

## 2013-01-04 NOTE — Interval H&P Note (Signed)
History and Physical Interval Note:  01/04/2013 10:30 AM  [name]  has presented today for surgery, with the diagnosis of svt  The various methods of treatment have been discussed with the patient and family. After consideration of risks, benefits and other options for treatment, the patient has consented to  Procedure(s): ELECTROPHYSIOLOGY STUDY (N/A) SUPRAVENTRICULAR TACHYCARDIA ABLATION (N/A) as a surgical intervention .  The patient's history has been reviewed, patient examined, no change in status, stable for surgery.  I have reviewed the patient's chart and labs.  Questions were answered to the patient's satisfaction.     Lewayne Bunting

## 2013-01-04 NOTE — Op Note (Signed)
Invasive EP study with 3 D mapping carried out. No ablation performed as patient's tachycardia could not be re-induced. M#[id number].

## 2013-01-04 NOTE — H&P (View-Only) (Signed)
HPI [name] returns today for followup. She is a 48 year old woman with a history of tachycardia palpitations which have increased in frequency and severity.  She underwent EP study over 10 years ago. At that time she was found to have nonsustained tachycardia with apparent multiple mechanisms. She was initially treated with flecainide and improvement. However, she became intolerant of this medication. Over the last 2 years, and particularly over the last few months, she has had increasingly frequent episodes of tachypalpitations which start suddenly and stop suddenly, and may last up to an hour in duration. She has not had syncope the episodes are associated with shortness of breath and chest pressure. She has been on beta blocker therapy with no improvement. No Known Allergies   Current Outpatient Prescriptions  Medication Sig Dispense Refill  . metoprolol (LOPRESSOR) 50 MG tablet Take 50 mg by mouth as needed.       No current facility-administered medications for this visit.     Past Medical History  Diagnosis Date  . History of uterine fibroid   . Menorrhagia   . Dysmenorrhea 2011  . Difficulty sleeping   . Chest pain   . Supraventricular tachycardia   . Heart palpitations     ROS:   All systems reviewed and negative except as noted in the HPI.   Past Surgical History  Procedure Laterality Date  . Tubal ligation    . Hysteroscopy    . Breast surgery    . Combined augmentation mammaplasty and abdominoplasty Bilateral 2002     No family history on file.   History   Social History  . Marital Status: Legally Separated    Spouse Name: N/A    Number of Children: N/A  . Years of Education: N/A   Occupational History  . Not on file.   Social History Main Topics  . Smoking status: Former Smoker    Types: Cigarettes    Quit date: 12/27/1984  . Smokeless tobacco: Never Used  . Alcohol Use: Yes  . Drug Use: No  . Sexually Active: Not on file   Other Topics  Concern  . Not on file   Social History Narrative  . No narrative on file     BP 109/68  Pulse 66  Ht 4\' 11"  (1.499 m)  Wt 139 lb (63.05 kg)  BMI 28.06 kg/m2  Physical Exam:  Well appearing middle-age woman,NAD HEENT: Unremarkable Neck:  No JVD, no thyromegally Lymphatics:  No adenopathy Back:  No CVA tenderness Lungs:  Clear with no wheezes, rales, or rhonchi. HEART:  Regular rate rhythm, no murmurs, no rubs, no clicks Abd:  soft, positive bowel sounds, no organomegally, no rebound, no guarding Ext:  2 plus pulses, no edema, no cyanosis, no clubbing Skin:  No rashes no nodules Neuro:  CN II through XII intact, motor grossly intact  EKG - normal sinus rhythm with normal axis and intervals. Cardiac monitoring - review of her cardiac monitor demonstrates SVT at 180 beats per minute.   Assess/Plan:

## 2013-01-04 NOTE — Progress Notes (Signed)
Pt reports that she had a procedure the she "cannot have any more babies".

## 2013-01-05 DIAGNOSIS — I471 Supraventricular tachycardia: Secondary | ICD-10-CM

## 2013-01-05 MED ORDER — PROPAFENONE HCL 225 MG PO TABS: 225.0000 mg | ORAL_TABLET | Freq: Two times a day (BID) | ORAL | Status: DC

## 2013-01-05 NOTE — Progress Notes (Signed)
Patient ID: [name], female   DOB: 1966/06/26, 47 y.o.   MRN: [id number] Subjective:  No chest pain or sob  Objective:  Vital Signs in the last 24 hours: Temp:  [97.4 F (36.3 C)-98.3 F (36.8 C)] 98.2 F (36.8 C) (06/12 0751) Pulse Rate:  [73-98] 73 (06/12 0751) Resp:  [17-20] 17 (06/12 0751) BP: (95-114)/(43-76) 103/61 mmHg (06/12 0751) SpO2:  [96 %-100 %] 98 % (06/12 0751) Weight:  [134 lb 7.7 oz (61 kg)] 134 lb 7.7 oz (61 kg) (06/12 0020)  Intake/Output from previous day: 06/11 0701 - 06/12 0700 In: 960 [P.O.:960] Out: 1300 [Urine:1300] Intake/Output from this shift: Total I/O In: 120 [P.O.:120] Out: -   Physical Exam: Well appearing NAD HEENT: Unremarkable Neck:  No JVD Lungs:  Clear HEART:  Regular rate rhythm, no murmurs, no rubs, no clicks Abd:  Flat, positive bowel sounds, no organomegally, no rebound, no guarding Ext:  2 plus pulses, no edema, no cyanosis, no clubbing, right groin without hematoma Skin:  No rashes no nodules Neuro:  CN II through XII intact, motor grossly intact  Lab Results: No results found for this basename: WBC, HGB, PLT,  in the last 72 hours No results found for this basename: NA, K, CL, CO2, GLUCOSE, BUN, CREATININE,  in the last 72 hours No results found for this basename: TROPONINI, CK, MB,  in the last 72 hours Hepatic Function Panel No results found for this basename: PROT, ALBUMIN, AST, ALT, ALKPHOS, BILITOT, BILIDIR, IBILI,  in the last 72 hours No results found for this basename: CHOL,  in the last 72 hours No results found for this basename: PROTIME,  in the last 72 hours  Imaging: No results found.  Cardiac Studies: Tele - nsr with PAC's Assessment/Plan:  1. SVT 2. S/p EPS with no ablation attempted as SVT in LA and we could not sustain SVT prior to transeptal puncture. Rec: patient will be discharge on Propafenone 225 mg twice daily. Followup with me in 3-4 weeks. She will need an ECG in 1-2 weeks in office.  LOS: 1 day    Kimbly Eanes,M.D. 01/05/2013, 9:08 AM

## 2013-01-05 NOTE — Discharge Summary (Signed)
Discharge Summary   Patient ID: [name],  MRN: [id number], DOB/AGE: 08-02-1965 47 y.o.  Admit date: 01/04/2013 Discharge date: 01/05/2013  Primary Physician: Purcell Nails, MD Primary Electrophysiologist: G. Ladona Ridgel, MD  Discharge Diagnoses Principal Problem:   SUPRAVENTRICULAR TACHYCARDIA  - S/p EPS with no RFCA as atrial tachycardia was non inducible  - Discharged on Rhythmol 225mg  PO BID  - EKG in 1-2 weeks  - Follow-up with Rick Duff, PA-C in 3-4 weeks  Allergies No Known Allergies  Diagnostic Studies/Procedures  PROCEDURE PERFORMED 01/04/13: Invasive electrophysiologic study with 3D electro-anatomic mapping utilizing isoproterenol and mapping of the arrhythmia with ultrasound guidance.  CONCLUSION OF THE STUDY: Demonstrates inducible nonsustained atrial tachycardia which was mapped to the atrial septum and most likely located in the left atrium. Prior to performing transseptal puncture, the patient's atrial tachycardia was no longer inducible despite attempts for over 30 minutes. It was deemed that the patient is not a further ablation candidate. Arrhythmias and palpitations will require treatment with antiarrhythmic drug therapy going forward.   History of Present Illness  [name] is a 47 y.o. female who was hospitalized overnight at [facility] with the above problem list.   She has a history of PSVT for which she has followed up with Dr. Ladona Ridgel several times. This had been quiescent on flecainide, however she was no longer able to tolerate the antiarrhythmic. She began having more frequent and prolonged episodes of SVT. She was deemed an appropriate candidate for RFCA. The details, risks and benefits of the procedure were explained to the patient who agreed to proceed.  Hospital Course   She presented to [facility] on [facility]/04/13 for this procedure. She was informed, consented and prepped for the procedure which was accessed via the  right jugular vein. Initially, the study did demonstrate inducible nonsustained atrial tachycardia which was mapped of atrial septum and most likely located in the left atrium. However, prior to performing a transseptal puncture for ablation, the patient's arrhythmia was no longer able to be induced. The study was thus ended without ablation. She remained stable overnight without complaints. She was evaluated by Dr. Ladona Ridgel in the morning who recommended adding an alternative antiarrhythmic to control her paroxysmal SVT. As outlined below, Rythmol 225 mg by mouth twice a day will be initiated. She will followup in 1-2 weeks for EKG in the office. She will followup in 3-4 weeks with Rick Duff, PA-C. This information, including post procedure instructions and supplemental SVT material, has been clearly outlined in the discharge AVS.   Discharge Vitals:  Blood pressure 103/61, pulse 73, temperature 98.2 F (36.8 C), temperature source Oral, resp. rate 17, height 4\' 11"  (1.499 m), weight 61 kg (134 lb 7.7 oz), last menstrual period 12/19/2012, SpO2 98.00%.   Labs:  None  Disposition:  Discharge Orders   Future Appointments Provider Department Dept Phone   01/13/2013 9:00 AM [facility] Nurse Selena Batten [facility] [place]) [phone]   02/23/2013 10:00 AM Minda Meo, PA-C [facility] Main Office [place]) [phone]   Future Orders Complete By Expires     Diet - low sodium heart healthy  As directed     Increase activity slowly  As directed           Follow-up Information   Follow up with [facility] On 01/13/2013. (At 9:00 AM for EKG in the office. )    Contact information:   [address] [place] [address]  Follow up with Rick Duff, PA-C On 02/23/2013. (At 10:00 AM for follow-up. )    Contact information:   [address] [place] [address] [phone]       Discharge Medications:    Medication List      TAKE these medications       metoprolol 50 MG tablet  Commonly known as:  LOPRESSOR  Take 50 mg by mouth at bedtime as needed. For hypertension     propafenone 225 MG tablet  Commonly known as:  RYTHMOL  Take 1 tablet (225 mg total) by mouth every 12 (twelve) hours.       Outstanding Labs/Studies: EKG 01/13/13  Duration of Discharge Encounter: Greater than 30 minutes including physician time.  Signed, R. Hurman Horn, PA-C 01/05/2013, 10:51 AM

## 2013-01-05 NOTE — Op Note (Signed)
NAMEBETINA, PUCKETT            ACCOUNT NO.:  [id number]  MEDICAL RECORD NO.:  [id number]  LOCATION:  6524                         FACILITY:  [facility]  PHYSICIAN:  Doylene Canning. Ladona Ridgel, MD    DATE OF BIRTH:  October 19, 1965  DATE OF PROCEDURE:  01/04/2013 DATE OF DISCHARGE:                              OPERATIVE REPORT   PROCEDURE PERFORMED:  Invasive electrophysiologic study with 3D electro- anatomic mapping utilizing isoproterenol and mapping of the arrhythmia with ultrasound guidance.  INTRODUCTION:  The patient is a 47 year old woman with recurrent tachy palpitations.  In the past, she was treated with flecainide.  She was intolerant of this medication.  She had recurrent symptoms of palpitations and had documented bouts of SVT at rates up to 200 beats per minute.  These were associated with shortness of breath, chest pain, and near-syncope.  She is now referred for electrophysiologic study and catheter ablation.  PROCEDURE:  After informed consent was obtained, the patient was taken to the diagnostic EP lab in a fasting state.  After usual preparation and draping, intravenous fentanyl and midazolam were given for sedation. A 6-French hexapolar catheter was inserted percutaneously in the right jugular vein and advanced to the coronary sinus.  A 6-French quadripolar catheter was inserted percutaneously in the right femoral vein and advanced to right ventricle.  A 6-French quadripolar catheter was inserted percutaneously in the right femoral vein and advanced to the His bundle region.  After measuring the basic intervals, rapid ventricular pacing was carried out from the right ventricle at 500 milliseconds and stepwise decreased down to 320 milliseconds where AV Wenckebach was observed.  During rapid ventricular pacing, the atrial activation sequence was midline and decremental.  Programmed ventricular stimulation was carried out from the right ventricle at base drive cycle length of  161 milliseconds.  This was 2 interval stepwise decreased down to 250 milliseconds where ventricular refractoriness was observed.  During programmed ventricular stimulation, the atrial activation sequence was again midline and decremental.  There were no inducible arrhythmias.  Programmed atrial stimulation was carried out at a base drive cycle length of 096 milliseconds from the atrium.  This was 2 interval stepwise decreased from 440 milliseconds down to 240 milliseconds where atrial refractoriness was observed.  During programmed atrial stimulation, there were no echo beats, but no AH jumps and there was no inducible SVT.  Rapid atrial pacing was carried out from the right atrium at a pacing cycle length of 600 milliseconds and stepwise decreased down to 310 milliseconds where AV Wenckebach was observed.  During rapid atrial pacing, the PR interval was equal to greater than the RR interval and there was nonsustained SVT induced.  The SVT, however, was nonsustained. Mapping of the SVT suggested a right atrial flutter was still could not be certain.  At this point, isoproterenol was infused at rates of 1-4 mcg per minute.  Additional rapid atrial pacing was carried out demonstrating easily inducible nonsustained atrial tachycardia.  At this point, the backup or navigation catheter was inserted through the right femoral vein and advanced into the right atrium.  Mapping of the tachycardia with the decapolar catheter was then carried out.  This demonstrated the earliest atrial activation  to be along with septum.  A 7-French quadripolar navigation ablation catheter was then maneuvered into the right atrium and mapping along the atrial septum was carried out.  However, areas in the atrium were mapped and with 3D electro- anatomic mapping and the atrial septum was earliest.  At this point, the patient's SVT could be induced for anywhere from 5-20 beats.  It was easily and reproducibly  inducible but it was nonsustained.  The intracardiac ultrasound catheter was inserted through the right femoral vein and advanced into the right atrium.  A SL2 8-French transseptal sheath along with Brockenbrough needle were advanced into the right atrium as well with attempts made ready to perform transseptal puncture to map the left atrium.  Before doing transseptal, it was deemed most appropriate to be sure that the patient's atrial tachycardia could be reproducibly induced.  Over the next 30 minutes, the patient was paced over 100 times and there was not a single episode of sustained or nonsustained atrial tachycardia.  The mechanism for the patient's sudden inability to induce the atrial tachycardia is uncertain at this time. Because of the earliest site of the tachycardia was mapped initially from the right atrium on the atrial septum 1 could hypothesize that the atrial septum was location of the patient's tachycardia, not it was inadvertently bumped by ablation catheter.  It was very clear that there was easily inducible nonsustained atrial tachycardia prior to the insertion of the transseptal catheter and sheath and intracardiac ultrasound catheter and after insertion of the tools for transseptal puncture, there was no inducible SVT whatsoever for over 30 minutes.  At this point, isoproterenol was discontinued and additional rapid atrial pacing carried out, but again there was no inducible SVT.  At this point, it was deemed fruitless to continue the procedure and the catheters were removed.  Hemostasis was assured.  The patient was returned to her room in satisfactory condition.  COMPLICATIONS:  There were no immediate procedure complications.  RESULTS:  A.  Baseline ECG.  Baseline ECG demonstrates sinus rhythm with normal axis and intervals. B.  Baseline intervals.  The sinus node cycle length was 700 milliseconds.  The QRS duration was 100, pressure was 70 milliseconds. The  HV interval was 41 milliseconds.  The AH interval was 89 milliseconds. C.  Rapid ventricular pacing.  Rapid ventricular pacing was carried out in the right ventricle and stepwise decreased down to 320 milliseconds where VA Wenckebach was observed.  During rapid ventricular pacing, the atrial activation was midline and decremental. D.  Programmed ventricular stimulation.  Programmed ventricular stimulation was carried out in the right ventricle at base drive cycle length of 132 milliseconds with the S1-S2 interval stepwise decreased down to 250 milliseconds where ventricular refractoriness was observed. During programmed ventricular stimulation, the atrial activation was midline and decremental. E.  Rapid atrial pacing.  Rapid atrial pacing was carried out from the atrium at a base drive cycle length of 440 milliseconds and stepwise decreased down to 310 milliseconds.  During rapid atrial pacing on isoproterenol, there was initially inducible nonsustained SVT. F.  Programmed atrial stimulation.  Programmed atrial stimulation was carried out from the atrium at a base drive cycle length of 102 as well as 400 milliseconds.  The S1-S2 interval stepwise decreased down to 240 milliseconds where atrial refractoriness was observed.  During programmed atrial stimulation, there were no AH sounds but there were echo beats noted and no inducible SVT. G.  Arrhythmias observed. 1. Nonsustained atrial tachycardia.  Initiation was  with rapid atrial     pacing on isoproterenol.  The duration was nonsustained.     Termination was spontaneous.  At the end of the case, there was no     inducible SVT, however.     a.     Mapping.  Mapping of the patient's nonsustained atrial      tachycardia demonstrated early activation along the atrial septum,      which was in fact further the distal coronary sinus.     b.     Electro-anatomic mapping.  Electro-anatomic mapping was      carried out utilizing the cardio  sound technique.  It demonstrated      the earliest atrial activation along the atrial septum.  Once we      were ready to proceed with transseptal puncture, however, there      was no inducible SVT whatsoever and for this reason, transseptal      puncture was aborted.  CONCLUSION OF THE STUDY:  Demonstrates inducible nonsustained atrial tachycardia which was mapped to the atrial septum and most likely located in the left atrium.  Prior to performing transseptal puncture, the patient's atrial tachycardia was no longer inducible despite attempts for over 30 minutes.  It was deemed that the patient is not a further ablation candidate.  Arrhythmias and palpitations will require treatment with antiarrhythmic drug therapy going forward.     Doylene Canning. Ladona Ridgel, MD     GWT/MEDQ  D:  01/04/2013  T:  01/05/2013  Job:  [id number]  cc:   Tereso Newcomer, PA-C

## 2013-01-13 ENCOUNTER — Ambulatory Visit (INDEPENDENT_AMBULATORY_CARE_PROVIDER_SITE_OTHER): Payer: Medicare HMO | Admitting: *Deleted

## 2013-01-13 VITALS — BP 128/78 | HR 57 | Ht 59.0 in | Wt 138.5 lb

## 2013-01-13 DIAGNOSIS — I498 Other specified cardiac arrhythmias: Secondary | ICD-10-CM

## 2013-01-13 NOTE — Progress Notes (Signed)
Attempted ablation on 01/04/13 Pt in for an EKG order on D/C from hospital EKG done per RN Sinus Bradycardia 57 beats/minute, right arm BP 128/78 left arm 120/70. EKG  read by Dr. Eden Emms DOD. Pt is taken Metoprolol 50 mg as needed. Pt states this medication make her feel sickly , nauseous, and gives her a bad taste at her mouth so, she is not going to take it anymore, pt took the last dose this past Wednesday 6/18. Pt would like to take something else.  Pt has a return appointment with Nathanial Millman PA on 7/31 at 10:00 AM; pt would like to change to an earlier time the same day.

## 2013-02-01 ENCOUNTER — Other Ambulatory Visit: Payer: Self-pay | Admitting: *Deleted

## 2013-02-01 MED ORDER — PROPAFENONE HCL 225 MG PO TABS: 225.0000 mg | ORAL_TABLET | Freq: Two times a day (BID) | ORAL | Status: DC

## 2013-02-14 ENCOUNTER — Telehealth: Payer: Self-pay | Admitting: Gynecology

## 2013-02-14 NOTE — Telephone Encounter (Signed)
Please return a call to Kissa at [facility] .Kissa states you faxed some forms to [facility] and she is not a provider there she would like to speak with you

## 2013-02-17 NOTE — Telephone Encounter (Signed)
Spoke with Kissa, she will fax back the information, does have a referral to their office ? Is she needs this info.

## 2013-02-23 ENCOUNTER — Encounter: Payer: Medicare FFS | Admitting: Cardiology

## 2013-08-14 ENCOUNTER — Encounter (HOSPITAL_COMMUNITY): Payer: Self-pay | Admitting: Emergency Medicine

## 2013-08-14 ENCOUNTER — Emergency Department (INDEPENDENT_AMBULATORY_CARE_PROVIDER_SITE_OTHER)
Admission: EM | Admit: 2013-08-14 | Discharge: 2013-08-14 | Disposition: A | Discharge status: Home / Self Care | Payer: Managed Care, Other (non HMO) | Source: Home / Self Care | Attending: Family Medicine | Admitting: Family Medicine

## 2013-08-14 DIAGNOSIS — R102 Pelvic and perineal pain: Secondary | ICD-10-CM

## 2013-08-14 DIAGNOSIS — N949 Unspecified condition associated with female genital organs and menstrual cycle: Secondary | ICD-10-CM

## 2013-08-14 LAB — POCT URINALYSIS DIP (DEVICE)
BILIRUBIN URINE: NEGATIVE
GLUCOSE, UA: NEGATIVE mg/dL
Hgb urine dipstick: NEGATIVE
KETONES UR: NEGATIVE mg/dL
Leukocytes, UA: NEGATIVE
Nitrite: NEGATIVE
Protein, ur: NEGATIVE mg/dL
SPECIFIC GRAVITY, URINE: 1.02 (ref 1.005–1.030)
Urobilinogen, UA: 0.2 mg/dL (ref 0.0–1.0)
pH: 7 (ref 5.0–8.0)

## 2013-08-14 LAB — POCT PREGNANCY, URINE: PREG TEST UR: NEGATIVE

## 2013-08-14 NOTE — ED Notes (Signed)
Pt c/o constant lower abd pain onset 3 weeks and has noticed that it increases w/foods Also c/o a knot on back onset 1 month w/no pain associated Denies: f/v/n/d, urinary sxs, gyn sxs She is alert w/no signs of acute distress.

## 2013-08-14 NOTE — ED Provider Notes (Signed)
CSN: [id number]     Arrival date & time 08/14/13  1322 History   First MD Initiated Contact with Patient 08/14/13 1545     Chief Complaint  Patient presents with  . Abdominal Pain   (Consider location/radiation/quality/duration/timing/severity/associated sxs/prior Treatment) Patient is a 48 y.o. female presenting with abdominal pain. The history is provided by the patient.  Abdominal Pain Pain location:  Suprapubic Pain quality: sharp   Pain radiates to:  Does not radiate Pain severity:  Moderate Onset quality:  Gradual Duration:  3 weeks Progression:  Worsening Chronicity:  Recurrent Context comment:  H/o fibroids, abnl pap, no gyn at this time, Relieved by:  None tried Worsened by:  Nothing tried Associated symptoms: no dysuria, no nausea, no vaginal bleeding, no vaginal discharge and no vomiting     Past Medical History  Diagnosis Date  . History of uterine fibroid   . Menorrhagia   . Dysmenorrhea 2011  . Difficulty sleeping   . Chest pain   . Supraventricular tachycardia   . Heart palpitations   . Shortness of breath     "associated w/high heart rate" (01/04/2013)  . Chronic anemia    Past Surgical History  Procedure Laterality Date  . Tubal ligation    . Breast surgery    . Combined augmentation mammaplasty and abdominoplasty Bilateral 2002  . Cardiac electrophysiology mapping and ablation  ~ 2008; 01/04/2013    "weren't able to do the ablation" (01/04/2013)  . Uterine fibroid surgery  ?09/2012   No family history on file. History  Substance Use Topics  . Smoking status: Former Smoker -- 0.33 packs/day for 3 years    Types: Cigarettes    Quit date: 12/27/1984  . Smokeless tobacco: Never Used  . Alcohol Use: Yes     Comment: 01/04/2013 "2-3 beers q other weekend"   OB History   Grav Para Term Preterm Abortions TAB SAB Ect Mult Living   3        1 4      Review of Systems  Constitutional: Negative.   Gastrointestinal: Positive for abdominal pain. Negative  for nausea and vomiting.  Genitourinary: Positive for pelvic pain. Negative for dysuria, urgency, frequency, vaginal bleeding, vaginal discharge, vaginal pain and menstrual problem.    Allergies  Review of patient's allergies indicates no known allergies.  Home Medications   Current Outpatient Rx  Name  Route  Sig  Dispense  Refill  . metoprolol (LOPRESSOR) 50 MG tablet   Oral   Take 50 mg by mouth at bedtime as needed. For hypertension         . propafenone (RYTHMOL) 225 MG tablet   Oral   Take 1 tablet (225 mg total) by mouth every 12 (twelve) hours.   180 tablet   0    BP 114/64  Pulse 85  Temp(Src) 98.8 F (37.1 C) (Oral)  Resp 16  SpO2 100%  LMP 07/15/2013 Physical Exam  Nursing note and vitals reviewed. Constitutional: She appears well-developed and well-nourished.  Abdominal: Soft. Bowel sounds are normal. She exhibits no distension and no mass. There is tenderness in the suprapubic area. There is no rigidity, no rebound, no guarding, no CVA tenderness, no tenderness at McBurney's point and negative Murphy's sign.    ED Course  Procedures (including critical care time) Labs Review Labs Reviewed  POCT URINALYSIS DIP (DEVICE)  POCT PREGNANCY, URINE   Imaging Review No results found.  EKG Interpretation    Date/Time:    Ventricular Rate:  PR Interval:    QRS Duration:   QT Interval:    QTC Calculation:   R Axis:     Text Interpretation:              MDM      Billy Fischer, MD 08/14/13 [phone]

## 2013-08-14 NOTE — Discharge Instructions (Signed)
[facility] pelvic pain problem.

## 2013-09-20 ENCOUNTER — Ambulatory Visit (INDEPENDENT_AMBULATORY_CARE_PROVIDER_SITE_OTHER): Payer: Medicare HMO | Admitting: Obstetrics & Gynecology

## 2013-09-20 ENCOUNTER — Encounter: Payer: Self-pay | Admitting: Obstetrics & Gynecology

## 2013-09-20 VITALS — BP 118/76 | HR 70 | Temp 97.7°F | Ht 59.0 in | Wt 141.2 lb

## 2013-09-20 DIAGNOSIS — D219 Benign neoplasm of connective and other soft tissue, unspecified: Secondary | ICD-10-CM

## 2013-09-20 DIAGNOSIS — D259 Leiomyoma of uterus, unspecified: Secondary | ICD-10-CM

## 2013-09-20 DIAGNOSIS — N949 Unspecified condition associated with female genital organs and menstrual cycle: Secondary | ICD-10-CM

## 2013-09-20 DIAGNOSIS — R102 Pelvic and perineal pain: Secondary | ICD-10-CM

## 2013-09-20 DIAGNOSIS — K625 Hemorrhage of anus and rectum: Secondary | ICD-10-CM

## 2013-09-20 DIAGNOSIS — G8929 Other chronic pain: Secondary | ICD-10-CM

## 2013-09-20 HISTORY — DX: Benign neoplasm of connective and other soft tissue, unspecified: D21.9

## 2013-09-20 NOTE — Progress Notes (Signed)
   CLINIC ENCOUNTER NOTE  History:  48 y.o. [id number] here today for evaluation of chronic pelvic pain and rectal bleeding whenever she has a bowel movement for the past three months. She attributes her pelvic pain to fibroids; <2 cm fibroids were diagnosed on outside scan in 2013.  The following portions of the patient's history were reviewed and updated as appropriate: allergies, current medications, past family history, past medical history, past social history, past surgical history and problem list.  Last pap on 11/17/12 was normal with neagtive HRHPV.  Review of Systems:  Pertinent items are noted in HPI.  Objective:  BP 118/76  Pulse 70  Temp(Src) 97.7 F (36.5 C) (Oral)  Ht 4\' 11"  (1.499 m)  Wt 141 lb 3.2 oz (64.048 kg)  BMI 28.50 kg/m2  LMP 09/20/2013 Physical Exam deferred  Assessment & Plan:  Pelvic ultrasound scheduled Patient referredto GI for evaluation of rectal bleeding   Verita Schneiders, MD, FACOG Attending [place], [place]

## 2013-09-20 NOTE — Patient Instructions (Signed)
Return to clinic for any scheduled appointments or for any gynecologic concerns as needed.   

## 2013-09-25 ENCOUNTER — Encounter: Payer: Self-pay | Admitting: Internal Medicine

## 2013-09-25 ENCOUNTER — Ambulatory Visit (INDEPENDENT_AMBULATORY_CARE_PROVIDER_SITE_OTHER): Payer: Medicare HMO | Admitting: Internal Medicine

## 2013-09-25 ENCOUNTER — Other Ambulatory Visit (INDEPENDENT_AMBULATORY_CARE_PROVIDER_SITE_OTHER): Payer: Managed Care, Other (non HMO)

## 2013-09-25 VITALS — BP 96/50 | HR 80 | Ht 58.5 in | Wt 142.0 lb

## 2013-09-25 DIAGNOSIS — D171 Benign lipomatous neoplasm of skin and subcutaneous tissue of trunk: Secondary | ICD-10-CM

## 2013-09-25 DIAGNOSIS — D509 Iron deficiency anemia, unspecified: Secondary | ICD-10-CM

## 2013-09-25 DIAGNOSIS — K625 Hemorrhage of anus and rectum: Secondary | ICD-10-CM

## 2013-09-25 DIAGNOSIS — D1779 Benign lipomatous neoplasm of other sites: Secondary | ICD-10-CM

## 2013-09-25 LAB — CBC WITH DIFFERENTIAL/PLATELET
Basophils Absolute: 0.1 10*3/uL (ref 0.0–0.1)
Basophils Relative: 1.2 % (ref 0.0–3.0)
EOS PCT: 1.7 % (ref 0.0–5.0)
Eosinophils Absolute: 0.1 10*3/uL (ref 0.0–0.7)
HEMATOCRIT: 38.3 % (ref 36.0–46.0)
HEMOGLOBIN: 12.3 g/dL (ref 12.0–15.0)
LYMPHS ABS: 2.9 10*3/uL (ref 0.7–4.0)
Lymphocytes Relative: 38.8 % (ref 12.0–46.0)
MCHC: 32.2 g/dL (ref 30.0–36.0)
MCV: 72.3 fl — ABNORMAL LOW (ref 78.0–100.0)
MONOS PCT: 7.2 % (ref 3.0–12.0)
Monocytes Absolute: 0.5 10*3/uL (ref 0.1–1.0)
NEUTROS ABS: 3.8 10*3/uL (ref 1.4–7.7)
Neutrophils Relative %: 51.1 % (ref 43.0–77.0)
Platelets: 374 10*3/uL (ref 150.0–400.0)
RBC: 5.29 Mil/uL — ABNORMAL HIGH (ref 3.87–5.11)
RDW: 17.6 % — ABNORMAL HIGH (ref 11.5–14.6)
WBC: 7.4 10*3/uL (ref 4.5–10.5)

## 2013-09-25 LAB — FERRITIN: FERRITIN: 5.9 ng/mL — AB (ref 10.0–291.0)

## 2013-09-25 NOTE — Progress Notes (Signed)
Quick Note:  Needs to start ferrous sulfate 325 mg bid Iron is low ______

## 2013-09-25 NOTE — Progress Notes (Signed)
Subjective:    Patient ID: [name], female    DOB: [birth year], 48 y.o.   MRN: [id number]  HPI This is a nice middle-aged woman with a several month history of intermittent rectal bleeding, new variable bowl habits and mucous per rectum. She has had some lower abdominal and pelvic pain. She is known to have had a chronic microcytic anemia, in setiing of menorrhagia and hemorrhoids. She has never had a GI evaluation.GI ROS otherwise negative.  No Known Allergies Outpatient Prescriptions Prior to Visit  Medication Sig Dispense Refill  . metoprolol (LOPRESSOR) 50 MG tablet Take 50 mg by mouth at bedtime as needed. For hypertension       No facility-administered medications prior to visit.   Past Medical History  Diagnosis Date  . History of uterine fibroid   . Menorrhagia   . Dysmenorrhea 2011  . Difficulty sleeping   . Chest pain   . Supraventricular tachycardia   . Heart palpitations   . Shortness of breath     "associated w/high heart rate" (01/04/2013)  . Chronic anemia    Past Surgical History  Procedure Laterality Date  . Tubal ligation    . Combined augmentation mammaplasty and abdominoplasty Bilateral 2002  . Cardiac electrophysiology mapping and ablation  ~ 2008; 01/04/2013    "weren't able to do the ablation" (01/04/2013)  . Uterine fibroid surgery  ?09/2012   History   Social History  . Marital Status: Legally Separated    Spouse Name: N/A    Number of Children: 4  . Years of Education: N/A   Occupational History  . order picker [employer]   Social History Main Topics  . Smoking status: Former Smoker -- 0.33 packs/day for 3 years    Types: Cigarettes    Quit date: 12/27/1984  . Smokeless tobacco: Never Used  . Alcohol Use: Yes     Comment: 01/04/2013 "2-3 beers q other weekend"  . Drug Use: No  . Sexual Activity: Yes    Family History  Problem Relation Age of Onset  . Cancer Brother   . Cancer Paternal Aunt     x 2 unkown type    . Heart disease Maternal Grandmother   . Heart Problems Mother     apid heartbeat    Review of Systems C/o spot on lower back - right - ? What it is + fatigue All other ROS negative     Objective:   Physical Exam General:  Well-developed, well-nourished and in no acute distress Eyes:  anicteric. ENT:   Mouth and posterior pharynx free of lesions. + dentures  Neck:   supple w/o thyromegaly or mass.  Lungs: Clear to auscultation bilaterally. Heart:  S1S2, no rubs, murmurs, gallops. Abdomen:  soft, non-tender, no hepatosplenomegaly, hernia, or mass and BS+. Low tranverse scar Rectal: deferred Lymph:  no cervical or supraclavicular adenopathy. Extremities:   no edema Skin   no rash. + tattoo back, small lipoma right upper lumbar region Neuro:  A&O x 3.  Psych:  appropriate mood and  Affect.   Data Reviewed:  Lab Results  Component Value Date   WBC 4.9 12/22/2012   HGB 11.6* 12/22/2012   HCT 35.0* 12/22/2012   MCV 70.3* 12/22/2012   PLT 335.0 12/22/2012       Assessment & Plan:  Rectal bleeding - Plan: CBC with Differential, Ferritin  Microcytic anemia - Plan: CBC with Differential, Ferritin  Lipoma of back  1. Investigate anemia and rectal bleeding with colonoscopy and labs as above The risks and benefits as well as alternatives of endoscopic procedure(s) have been discussed and reviewed. All questions answered. The patient agrees to proceed. No intervention for lipoma

## 2013-09-25 NOTE — Patient Instructions (Signed)
Your physician has requested that you go to the basement for the following lab work before leaving today: CBC, Ferritin  It has been recommended to you by your physician that you have a(n) colonoscopy completed. We did not schedule the procedure(s) today. Please contact our office at [phone] should you decide to have the procedure completed.   I appreciate the opportunity to care for you.

## 2013-10-02 ENCOUNTER — Ambulatory Visit (HOSPITAL_COMMUNITY)
Admission: RE | Admit: 2013-10-02 | Discharge: 2013-10-02 | Disposition: A | Discharge status: Home / Self Care | Payer: Private Health Insurance - Indemnity | Source: Ambulatory Visit | Attending: Obstetrics & Gynecology | Admitting: Obstetrics & Gynecology

## 2013-10-02 DIAGNOSIS — N949 Unspecified condition associated with female genital organs and menstrual cycle: Secondary | ICD-10-CM | POA: Insufficient documentation

## 2013-10-02 DIAGNOSIS — D259 Leiomyoma of uterus, unspecified: Secondary | ICD-10-CM | POA: Insufficient documentation

## 2013-10-02 DIAGNOSIS — N852 Hypertrophy of uterus: Secondary | ICD-10-CM | POA: Insufficient documentation

## 2013-10-02 DIAGNOSIS — D219 Benign neoplasm of connective and other soft tissue, unspecified: Secondary | ICD-10-CM

## 2013-10-02 DIAGNOSIS — G8929 Other chronic pain: Secondary | ICD-10-CM

## 2013-10-02 DIAGNOSIS — R102 Pelvic and perineal pain: Secondary | ICD-10-CM

## 2013-10-05 ENCOUNTER — Encounter: Payer: Self-pay | Admitting: Internal Medicine

## 2013-11-06 ENCOUNTER — Encounter: Payer: Self-pay | Admitting: Internal Medicine

## 2013-11-06 ENCOUNTER — Telehealth: Payer: Self-pay | Admitting: Internal Medicine

## 2013-11-06 NOTE — Telephone Encounter (Signed)
ERROR

## 2013-11-22 ENCOUNTER — Encounter: Payer: Self-pay | Admitting: Obstetrics & Gynecology

## 2013-11-22 ENCOUNTER — Other Ambulatory Visit (HOSPITAL_COMMUNITY)
Admission: RE | Admit: 2013-11-22 | Discharge: 2013-11-22 | Disposition: A | Discharge status: Home / Self Care | Payer: Managed Care, Other (non HMO) | Source: Ambulatory Visit | Attending: Obstetrics & Gynecology | Admitting: Obstetrics & Gynecology

## 2013-11-22 ENCOUNTER — Ambulatory Visit (INDEPENDENT_AMBULATORY_CARE_PROVIDER_SITE_OTHER): Payer: Managed Care, Other (non HMO) | Admitting: Obstetrics & Gynecology

## 2013-11-22 VITALS — BP 121/87 | HR 78 | Temp 98.8°F | Ht 64.0 in | Wt 141.4 lb

## 2013-11-22 DIAGNOSIS — R102 Pelvic and perineal pain: Secondary | ICD-10-CM

## 2013-11-22 DIAGNOSIS — N949 Unspecified condition associated with female genital organs and menstrual cycle: Secondary | ICD-10-CM

## 2013-11-22 DIAGNOSIS — Z1231 Encounter for screening mammogram for malignant neoplasm of breast: Secondary | ICD-10-CM

## 2013-11-22 DIAGNOSIS — Z0000 Encounter for general adult medical examination without abnormal findings: Secondary | ICD-10-CM

## 2013-11-22 DIAGNOSIS — G8929 Other chronic pain: Secondary | ICD-10-CM

## 2013-11-22 DIAGNOSIS — Z1151 Encounter for screening for human papillomavirus (HPV): Secondary | ICD-10-CM | POA: Insufficient documentation

## 2013-11-22 DIAGNOSIS — Z124 Encounter for screening for malignant neoplasm of cervix: Secondary | ICD-10-CM | POA: Insufficient documentation

## 2013-11-22 NOTE — Progress Notes (Signed)
Subjective:    [name] is a 48 y.o. female who presents for an annual exam. The patient has no complaints today. The patient is sexually active. GYN screening history: last pap: was normal. The patient wears seatbelts: yes. The patient participates in regular exercise: yes. Has the patient ever been transfused or tattooed?: yes. The patient reports that there is not domestic violence in her life.   Menstrual History: OB History   Grav Para Term Preterm Abortions TAB SAB Ect Mult Living   3 3 3  0 0 0 0 0 1 4      Menarche age: 66  Patient's last menstrual period was 10/21/2013.    The following portions of the patient's history were reviewed and updated as appropriate: allergies, current medications, past family history, past medical history, past social history, past surgical history and problem list.  Review of Systems A comprehensive review of systems was negative. separated. Some dryness with sex. Mammo due in June.   Objective:    BP 121/87  Pulse 78  Temp(Src) 98.8 F (37.1 C) (Oral)  Ht 5\' 4"  (1.626 m)  Wt 141 lb 6.4 oz (64.139 kg)  BMI 24.26 kg/m2  LMP 10/21/2013  General Appearance:    Alert, cooperative, no distress, appears stated age  Head:    Normocephalic, without obvious abnormality, atraumatic  Eyes:    PERRL, conjunctiva/corneas clear, EOM's intact, fundi    benign, both eyes  Ears:    Normal TM's and external ear canals, both ears  Nose:   Nares normal, septum midline, mucosa normal, no drainage    or sinus tenderness  Throat:   Lips, mucosa, and tongue normal; teeth and gums normal  Neck:   Supple, symmetrical, trachea midline, no adenopathy;    thyroid:  no enlargement/tenderness/nodules; no carotid   bruit or JVD  Back:     Symmetric, no curvature, ROM normal, no CVA tenderness  Lungs:     Clear to auscultation bilaterally, respirations unlabored  Chest Wall:    No tenderness or deformity   Heart:    Regular rate and rhythm, S1 and S2 normal,  no murmur, rub   or gallop  Breast Exam:    No tenderness, masses, or nipple abnormality  Abdomen:     Soft, non-tender, bowel sounds active all four quadrants,    no masses, no organomegaly  Genitalia:    Normal female without lesion, discharge or tenderness, normal cervix (looks nulliparous), 15 week size uterus, NT, mobile, non palpable adnexa     Extremities:   Extremities normal, atraumatic, no cyanosis or edema  Pulses:   2+ and symmetric all extremities  Skin:   Skin color, texture, turgor normal, no rashes or lesions  Lymph nodes:   Cervical, supraclavicular, and axillary nodes normal  Neurologic:   CNII-XII intact, normal strength, sensation and reflexes    throughout  .    Assessment:    Healthy female exam.  Some pelvic pain- possibly due to her fibroids   Plan:     Breast self exam technique reviewed and patient encouraged to perform self-exam monthly.  Thin prep pap with cotesting Schedule mammo and gyn u/s

## 2013-11-28 ENCOUNTER — Ambulatory Visit (AMBULATORY_SURGERY_CENTER): Payer: Self-pay | Admitting: *Deleted

## 2013-11-28 VITALS — Ht 59.0 in | Wt 140.0 lb

## 2013-11-28 DIAGNOSIS — K625 Hemorrhage of anus and rectum: Secondary | ICD-10-CM

## 2013-11-28 MED ORDER — NA SULFATE-K SULFATE-MG SULF 17.5-3.13-1.6 GM/177ML PO SOLN: 1.0000 | Freq: Once | ORAL | Status: DC

## 2013-11-28 NOTE — Progress Notes (Signed)
No egg or soy allergy. No anesthesia problems.  No home O2.  No diet meds.  

## 2013-11-29 ENCOUNTER — Ambulatory Visit (HOSPITAL_COMMUNITY)
Admission: RE | Admit: 2013-11-29 | Discharge: 2013-11-29 | Disposition: A | Discharge status: Home / Self Care | Payer: Managed Care, Other (non HMO) | Source: Ambulatory Visit | Attending: Obstetrics & Gynecology | Admitting: Obstetrics & Gynecology

## 2013-11-29 DIAGNOSIS — D252 Subserosal leiomyoma of uterus: Secondary | ICD-10-CM | POA: Insufficient documentation

## 2013-11-29 DIAGNOSIS — R102 Pelvic and perineal pain: Secondary | ICD-10-CM

## 2013-11-29 DIAGNOSIS — D251 Intramural leiomyoma of uterus: Secondary | ICD-10-CM | POA: Insufficient documentation

## 2013-11-29 DIAGNOSIS — N949 Unspecified condition associated with female genital organs and menstrual cycle: Secondary | ICD-10-CM | POA: Insufficient documentation

## 2013-11-29 DIAGNOSIS — G8929 Other chronic pain: Secondary | ICD-10-CM | POA: Insufficient documentation

## 2013-12-12 ENCOUNTER — Ambulatory Visit (AMBULATORY_SURGERY_CENTER): Payer: Managed Care, Other (non HMO) | Admitting: Internal Medicine

## 2013-12-12 ENCOUNTER — Encounter: Payer: Self-pay | Admitting: Internal Medicine

## 2013-12-12 ENCOUNTER — Telehealth: Payer: Self-pay

## 2013-12-12 VITALS — BP 126/66 | HR 61 | Temp 97.2°F | Resp 23 | Ht 59.0 in | Wt 140.0 lb

## 2013-12-12 DIAGNOSIS — K644 Residual hemorrhoidal skin tags: Secondary | ICD-10-CM

## 2013-12-12 DIAGNOSIS — K648 Other hemorrhoids: Secondary | ICD-10-CM

## 2013-12-12 DIAGNOSIS — K625 Hemorrhage of anus and rectum: Secondary | ICD-10-CM

## 2013-12-12 HISTORY — PX: COLONOSCOPY: SHX174

## 2013-12-12 MED ORDER — SODIUM CHLORIDE 0.9 % IV SOLN: 500.0000 mL | INTRAVENOUS | Status: DC

## 2013-12-12 NOTE — Patient Instructions (Addendum)
I found hemorrhoids - these are causing the bleeding.  Everything else is ok.  I can fix the hemorrhoids with an office procedure called rubber banding - we will contact you to schedule an appointment to review and perform this if desired.  Ferrous gluconate is another iron formulation which you can use instead of ferrous sulfat - it may be easier on the stomach.  Next routine colonoscopy in 10 years - 2025  I appreciate the opportunity to care for you. Gatha Mayer, MD, FACG YOU HAD AN ENDOSCOPIC PROCEDURE TODAY AT [facility]: Refer to the procedure report that was given to you for any specific questions about what was found during the examination.  If the procedure report does not answer your questions, please call your gastroenterologist to clarify.  If you requested that your care partner not be given the details of your procedure findings, then the procedure report has been included in a sealed envelope for you to review at your convenience later.  YOU SHOULD EXPECT: Some feelings of bloating in the abdomen. Passage of more gas than usual.  Walking can help get rid of the air that was put into your GI tract during the procedure and reduce the bloating. If you had a lower endoscopy (such as a colonoscopy or flexible sigmoidoscopy) you may notice spotting of blood in your stool or on the toilet paper. If you underwent a bowel prep for your procedure, then you may not have a normal bowel movement for a few days.  DIET: Your first meal following the procedure should be a light meal and then it is ok to progress to your normal diet.  A half-sandwich or bowl of soup is an example of a good first meal.  Heavy or fried foods are harder to digest and may make you feel nauseous or bloated.  Likewise meals heavy in dairy and vegetables can cause extra gas to form and this can also increase the bloating.  Drink plenty of fluids but you should avoid alcoholic beverages for 24  hours.  ACTIVITY: Your care partner should take you home directly after the procedure.  You should plan to take it easy, moving slowly for the rest of the day.  You can resume normal activity the day after the procedure however you should NOT DRIVE or use heavy machinery for 24 hours (because of the sedation medicines used during the test).    SYMPTOMS TO REPORT IMMEDIATELY: A gastroenterologist can be reached at any hour.  During normal business hours, 8:30 AM to 5:00 PM Monday through Friday, call [phone].  After hours and on weekends, please call the GI answering service at [phone] who will take a message and have the physician on call contact you.   Following lower endoscopy (colonoscopy or flexible sigmoidoscopy):  Excessive amounts of blood in the stool  Significant tenderness or worsening of abdominal pains  Swelling of the abdomen that is new, acute  Fever of 100F or higher  FOLLOW UP: If any biopsies were taken you will be contacted by phone or by letter within the next 1-3 weeks.  Call your gastroenterologist if you have not heard about the biopsies in 3 weeks.  Our staff will call the home number listed on your records the next business day following your procedure to check on you and address any questions or concerns that you may have at that time regarding the information given to you following your procedure. This is a Manufacturing engineer  call and so if there is no answer at the home number and we have not heard from you through the emergency physician on call, we will assume that you have returned to your regular daily activities without incident.  SIGNATURES/CONFIDENTIALITY: You and/or your care partner have signed paperwork which will be entered into your electronic medical record.  These signatures attest to the fact that that the information above on your After Visit Summary has been reviewed and is understood.  Full responsibility of the confidentiality of this discharge  information lies with you and/or your care-partner.  Please read the handout on hemorrhoids.

## 2013-12-12 NOTE — Telephone Encounter (Signed)
Spoke with the patient and she is advised of appt for 01/15/14 3:15.  Letter mailed

## 2013-12-12 NOTE — Progress Notes (Signed)
Report to pacu rn, vss, bbs=clear 

## 2013-12-12 NOTE — Op Note (Signed)
[place]  [address] [place], [id number]   COLONOSCOPY PROCEDURE REPORT  PATIENT: [name], [name].  MR#: [id number] BIRTHDATE: 1965/11/14 , 47  yrs. old GENDER: Female ENDOSCOPIST: Gatha Mayer, MD, [facility] PROCEDURE DATE:  12/12/2013 PROCEDURE:   Colonoscopy, diagnostic First Screening Colonoscopy - Avg.  risk and is 50 yrs.  old or older - No.  Prior Negative Screening - Now for repeat screening. N/A  History of Adenoma - Now for follow-up colonoscopy & has been > or = to 3 yrs.  N/A  Polyps Removed Today? No.  Recommend repeat exam, <10 yrs? No. ASA CLASS:   Class II INDICATIONS:Rectal Bleeding and Iron Deficiency Anemia. MEDICATIONS: propofol (Diprivan) 200mg  IV, MAC sedation, administered by CRNA, and These medications were titrated to patient response per physician's verbal order  DESCRIPTION OF PROCEDURE:   After the risks benefits and alternatives of the procedure were thoroughly explained, informed consent was obtained.  A digital rectal exam revealed no abnormalities of the rectum.   The LB [id number]  endoscope was introduced through the anus and advanced to the cecum, which was identified by both the appendix and ileocecal valve. No adverse events experienced.   The quality of the prep was excellent using Suprep  The instrument was then slowly withdrawn as the colon was fully examined.  COLON FINDINGS: Moderate sized internal and external hemorrhoids were found.   The colon mucosa was otherwise normal.   A right colon retroflexion was performed.  Retroflexed views revealed internal/external hemorrhoids. The time to cecum=1 minutes 49 seconds.  Withdrawal time=9 minutes 25 seconds.  The scope was withdrawn and the procedure completed. COMPLICATIONS: There were no complications.  ENDOSCOPIC IMPRESSION: 1.   Moderate sized internal and external hemorrhoids 2.   The colon mucosa was otherwise normal  RECOMMENDATIONS: My office will  arrange appointment for hemorrhoid banding. Routine colonoscopy in 10 years 2025   eSigned:  Gatha Mayer, MD, [facility] 12/12/2013 12:12 PM   cc: The Patient

## 2013-12-12 NOTE — Telephone Encounter (Signed)
Message copied by Marlon Pel on Tue Dec 12, 2013  1:24 PM ------      Message from: Gatha Mayer      Created: Tue Dec 12, 2013 12:22 PM      Regarding: needs hemorrhoid banding slot in next few weeks       Please get her an appointment for May or June ------

## 2013-12-13 ENCOUNTER — Telehealth: Payer: Self-pay | Admitting: *Deleted

## 2013-12-13 NOTE — Telephone Encounter (Signed)
  Follow up Call-  Call back number 12/12/2013  Post procedure Call Back phone  # [phone]  Permission to leave phone message Yes     Patient questions:  Do you have a fever, pain , or abdominal swelling? no Pain Score  0 *  Have you tolerated food without any problems? yes  Have you been able to return to your normal activities? yes  Do you have any questions about your discharge instructions: Diet   no Medications  no Follow up visit  no  Do you have questions or concerns about your Care? no  Actions: * If pain score is 4 or above: No action needed, pain <4.

## 2013-12-27 ENCOUNTER — Ambulatory Visit: Payer: Medicare HMO | Admitting: Obstetrics & Gynecology

## 2013-12-29 ENCOUNTER — Ambulatory Visit (HOSPITAL_COMMUNITY): Payer: Medicare HMO

## 2014-01-15 ENCOUNTER — Encounter: Payer: Managed Care, Other (non HMO) | Admitting: Internal Medicine

## 2014-01-29 ENCOUNTER — Ambulatory Visit: Payer: Managed Care, Other (non HMO) | Admitting: Obstetrics & Gynecology

## 2014-02-15 ENCOUNTER — Encounter: Payer: Self-pay | Admitting: Obstetrics & Gynecology

## 2014-02-15 ENCOUNTER — Ambulatory Visit (INDEPENDENT_AMBULATORY_CARE_PROVIDER_SITE_OTHER): Payer: Managed Care, Other (non HMO) | Admitting: Obstetrics & Gynecology

## 2014-02-15 VITALS — BP 116/71 | HR 81 | Temp 98.2°F | Ht 59.0 in | Wt 137.8 lb

## 2014-02-15 DIAGNOSIS — D259 Leiomyoma of uterus, unspecified: Secondary | ICD-10-CM

## 2014-02-15 MED ORDER — TRAMADOL HCL 50 MG PO TABS: 50.0000 mg | ORAL_TABLET | Freq: Four times a day (QID) | ORAL | Status: DC | PRN

## 2014-02-15 NOTE — Progress Notes (Signed)
Subjective:     Patient ID: [name], female   DOB: 20-Mar-1966, 48 y.o.   MRN: [id number]  HPI Pt reports being seen multiple times for eval for pelvic pain. She is here to f/u on sono and discuss treatement.  She reports SVD x4.  Denies intraabdominal surgeries.     Past Medical History  Diagnosis Date  . History of uterine fibroid   . Menorrhagia   . Dysmenorrhea 2011  . Difficulty sleeping   . Chest pain   . Supraventricular tachycardia   . Heart palpitations   . Shortness of breath     "associated w/high heart rate" (01/04/2013)  . Chronic anemia    Past Surgical History  Procedure Laterality Date  . Tubal ligation    . Combined augmentation mammaplasty and abdominoplasty Bilateral 2002  . Cardiac electrophysiology mapping and ablation  ~ 2008; 01/04/2013    "weren't able to do the ablation" (01/04/2013)  . Uterine fibroid surgery  ?09/2012  . Colonoscopy  12/12/2013   Current Outpatient Prescriptions on File Prior to Visit  Medication Sig Dispense Refill  . metoprolol (LOPRESSOR) 50 MG tablet Take 50 mg by mouth at bedtime as needed. For hypertension       No current facility-administered medications on file prior to visit.   No Known Allergies  History   Social History  . Marital Status: Legally Separated    Spouse Name: N/A    Number of Children: 4  . Years of Education: N/A   Occupational History  . order picker [employer]   Social History Main Topics  . Smoking status: Former Smoker -- 0.33 packs/day for 3 years    Types: Cigarettes    Quit date: 12/27/1984  . Smokeless tobacco: Never Used  . Alcohol Use: Yes     Comment: 01/04/2013 "2-3 beers q other weekend"  . Drug Use: No  . Sexual Activity: Yes    Birth Control/ Protection: None   Other Topics Concern  . Not on file   Social History Narrative  . No narrative on file   Family History  Problem Relation Age of Onset  . Cancer Brother   . Cancer Paternal Aunt     x 2 unkown type   . Heart disease Maternal Grandmother   . Heart Problems Mother     apid heartbeat  . Colon cancer Neg Hx    Review of Systems     Objective:   Physical Exam BP 116/71  Pulse 81  Temp(Src) 98.2 F (36.8 C) (Oral)  Ht 4\' 11"  (1.499 m)  Wt 137 lb 12.8 oz (62.506 kg)  BMI 27.82 kg/m2  LMP 01/21/2014 Pt in NAD Abd: well healed abdominoplasty incision GU: EGBUS: no lesions Vagina: no blood in vault Cervix: no lesion; no mucopurulent d/c Uterus: ~10 weeks sized with irregular contour; mobile with good descensus. Adnexa: no masses; sl tender      11/29/2013 EXAM:  TRANSVAGINAL ULTRASOUND OF PELVIS  TECHNIQUE:  Transvaginal ultrasound examination of the pelvis was performed  including evaluation of the uterus, ovaries, adnexal regions, and  pelvic cul-de-sac.  COMPARISON: 10/02/2013  FINDINGS:  Uterus  Measurements: 10.7 x 6.0 x 7.2 cm. Right posterior fibroid measures  3 x 3 x 3 cm with slight mass effect on the endometrium. Posterior  right subserosal fibroid measures 2.8 x 2.7 x 1.4 cm. Left  intramural fibroid measures 1.6 x 1.4 x 0.9 cm.  Endometrium  Thickness: 15 mm in thickness. No focal abnormality  visualized.  Right ovary  Measurements: 3.5 x 1.7 x 2.0 cm. Normal appearance/no adnexal mass.  Left ovary  Measurements: Not visualized. No adnexal masses seen.  Other findings: No free fluid  IMPRESSION:  At least 3 fibroids as described above.      Assessment:     Uterine fibroids- pt desires definitive treatment with hyst.  She declines IUD or OCPs      Plan:     Patient desires surgical management with total vaginal hysterectomy with bilateral salpingectomy.  The risks of surgery were discussed in detail with the patient including but not limited to: bleeding which may require transfusion or reoperation; infection which may require prolonged hospitalization or re-hospitalization and antibiotic therapy; injury to bowel, bladder, ureters and major vessels or  other surrounding organs; need for additional procedures including laparotomy; thromboembolic phenomenon, incisional problems and other postoperative or anesthesia complications.  Patient was told that the likelihood that her condition and symptoms will be treated effectively with this surgical management was very high; the postoperative expectations were also discussed in detail. The patient also understands the alternative treatment options which were discussed in full. All questions were answered.  She was told that she will be contacted by our surgical scheduler regarding the time and date of her surgery; routine preoperative instructions of having nothing to eat or drink after midnight on the day prior to surgery and also coming to the hospital 1 1/2 hours prior to her time of surgery were also emphasized.  She was told she may be called for a preoperative appointment about a week prior to surgery and will be given further preoperative instructions at that visit. Printed patient education handouts about the procedure were given to the patient to review at home.  Ultram 50mg  po q 6 hours prn pain #60.  No refills

## 2014-02-15 NOTE — Patient Instructions (Addendum)
Uterine Fibroid A uterine fibroid is a growth (tumor) that occurs in your uterus. This type of tumor is not cancerous and does not spread out of the uterus. You can have one or many fibroids. Fibroids can vary in size, weight, and where they grow in the uterus. Some can become quite large. Most fibroids do not require medical treatment, but some can cause pain or heavy bleeding during and between periods. CAUSES  A fibroid is the result of a single uterine cell that keeps growing (unregulated), which is different than most cells in the human body. Most cells have a control mechanism that keeps them from reproducing without control.  SIGNS AND SYMPTOMS   Bleeding.  Pelvic pain and pressure.  Bladder problems due to the size of the fibroid.  Infertility and miscarriages depending on the size and location of the fibroid. DIAGNOSIS  Uterine fibroids are diagnosed through a physical exam. Your health care provider may feel the lumpy tumors during a pelvic exam. Ultrasonography may be done to get information regarding size, location, and number of tumors.  TREATMENT   Your health care provider may recommend watchful waiting. This involves getting the fibroid checked by your health care provider to see if it grows or shrinks.   Hormone treatment or an intrauterine device (IUD) may be prescribed.   Surgery may be needed to remove the fibroids (myomectomy) or the uterus (hysterectomy). This depends on your situation. When fibroids interfere with fertility and a woman wants to become pregnant, a health care provider may recommend having the fibroids removed.  HOME CARE INSTRUCTIONS  Home care depends on how you were treated. In general:   Keep all follow-up appointments with your health care provider.   Only take over-the-counter or prescription medicines as directed by your health care provider. If you were prescribed a hormone treatment, take the hormone medicines exactly as directed. Do not  take aspirin. It can cause bleeding.   Talk to your health care provider about taking iron pills.  If your periods are troublesome but not so heavy, lie down with your feet raised slightly above your heart. Place cold packs on your lower abdomen.   If your periods are heavy, write down the number of pads or tampons you use per month. Bring this information to your health care provider.   Include green vegetables in your diet.  SEEK IMMEDIATE MEDICAL CARE IF:  You have pelvic pain or cramps not controlled with medicines.   You have a sudden increase in pelvic pain.   You have an increase in bleeding between and during periods.   You have excessive periods and soak tampons or pads in a half hour or less.  You feel lightheaded or have fainting episodes. Document Released: 07/10/2000 Document Revised: 05/03/2013 Document Reviewed: 02/09/2013 ExitCare Patient Information 2015 [employer]. This information is not intended to replace advice given to you by your health care provider. Make sure you discuss any questions you have with your health care provider. Hysterectomy Information  A hysterectomy is a surgery in which your uterus is removed. This surgery may be done to treat various medical problems. After the surgery, you will no longer have menstrual periods. The surgery will also make you unable to become pregnant (sterile). The fallopian tubes and ovaries can be removed (bilateral salpingo-oophorectomy) during this surgery as well.  REASONS FOR A HYSTERECTOMY  Persistent, abnormal bleeding.  Lasting (chronic) pelvic pain or infection.  The lining of the uterus (endometrium) starts growing outside   the uterus (endometriosis).  The endometrium starts growing in the muscle of the uterus (adenomyosis).  The uterus falls down into the vagina (pelvic organ prolapse).  Noncancerous growths in the uterus (uterine fibroids) that cause symptoms.  Precancerous cells.  Cervical  cancer or uterine cancer. TYPES OF HYSTERECTOMIES  Supracervical hysterectomy--In this type, the top part of the uterus is removed, but not the cervix.  Total hysterectomy--The uterus and cervix are removed.  Radical hysterectomy--The uterus, the cervix, and the fibrous tissue that holds the uterus in place in the pelvis (parametrium) are removed. WAYS A HYSTERECTOMY CAN BE PERFORMED  Abdominal hysterectomy--A large surgical cut (incision) is made in the abdomen. The uterus is removed through this incision.  Vaginal hysterectomy--An incision is made in the vagina. The uterus is removed through this incision. There are no abdominal incisions.  Conventional laparoscopic hysterectomy--Three or four small incisions are made in the abdomen. A thin, lighted tube with a camera (laparoscope) is inserted into one of the incisions. Other tools are put through the other incisions. The uterus is cut into small pieces. The small pieces are removed through the incisions, or they are removed through the vagina.  Laparoscopically assisted vaginal hysterectomy (LAVH)--Three or four small incisions are made in the abdomen. Part of the surgery is performed laparoscopically and part vaginally. The uterus is removed through the vagina.  Robot-assisted laparoscopic hysterectomy--A laparoscope and other tools are inserted into 3 or 4 small incisions in the abdomen. A computer-controlled device is used to give the surgeon a 3D image and to help control the surgical instruments. This allows for more precise movements of surgical instruments. The uterus is cut into small pieces and removed through the incisions or removed through the vagina. RISKS AND COMPLICATIONS  Possible complications associated with this procedure include:  Bleeding and risk of blood transfusion. Tell your health care provider if you do not want to receive any blood products.  Blood clots in the legs or lung.  Infection.  Injury to  surrounding organs.  Problems or side effects related to anesthesia.  Conversion to an abdominal hysterectomy from one of the other techniques. WHAT TO EXPECT AFTER A HYSTERECTOMY  You will be given pain medicine.  You will need to have someone with you for the first 3-5 days after you go home.  You will need to follow up with your surgeon in 2-4 weeks after surgery to evaluate your progress.  You may have early menopause symptoms such as hot flashes, night sweats, and insomnia.  If you had a hysterectomy for a problem that was not cancer or not a condition that could lead to cancer, then you no longer need Pap tests. However, even if you no longer need a Pap test, a regular exam is a good idea to make sure no other problems are starting. Document Released: 01/06/2001 Document Revised: 05/03/2013 Document Reviewed: 03/20/2013 ExitCare Patient Information 2015 [employer]. This information is not intended to replace advice given to you by your health care provider. Make sure you discuss any questions you have with your health care provider.  

## 2014-03-21 ENCOUNTER — Encounter (HOSPITAL_COMMUNITY)
Admission: RE | Admit: 2014-03-21 | Discharge: 2014-03-21 | Disposition: A | Discharge status: Home / Self Care | Payer: Managed Care, Other (non HMO) | Source: Ambulatory Visit | Attending: Obstetrics & Gynecology | Admitting: Obstetrics & Gynecology

## 2014-03-21 ENCOUNTER — Encounter (HOSPITAL_COMMUNITY): Payer: Self-pay

## 2014-03-21 DIAGNOSIS — D259 Leiomyoma of uterus, unspecified: Secondary | ICD-10-CM | POA: Diagnosis not present

## 2014-03-21 DIAGNOSIS — N949 Unspecified condition associated with female genital organs and menstrual cycle: Secondary | ICD-10-CM | POA: Diagnosis present

## 2014-03-21 DIAGNOSIS — N920 Excessive and frequent menstruation with regular cycle: Secondary | ICD-10-CM | POA: Insufficient documentation

## 2014-03-21 DIAGNOSIS — Z01818 Encounter for other preprocedural examination: Secondary | ICD-10-CM | POA: Diagnosis present

## 2014-03-21 HISTORY — DX: Cardiac arrhythmia, unspecified: I49.9

## 2014-03-21 LAB — BASIC METABOLIC PANEL
Anion gap: 8 (ref 5–15)
BUN: 6 mg/dL (ref 6–23)
CO2: 25 mEq/L (ref 19–32)
Calcium: 8.8 mg/dL (ref 8.4–10.5)
Chloride: 105 mEq/L (ref 96–112)
Creatinine, Ser: 0.62 mg/dL (ref 0.50–1.10)
GFR calc Af Amer: >90 mL/min (ref >90)
Glucose, Bld: 89 mg/dL (ref 70–99)
Potassium: 4.1 mEq/L (ref 3.7–5.3)
Sodium: 138 mEq/L (ref 137–147)

## 2014-03-21 LAB — CBC
HCT: 33 % — ABNORMAL LOW (ref 36.0–46.0)
Hemoglobin: 11.5 g/dL — ABNORMAL LOW (ref 12.0–15.0)
MCH: 23.9 pg — AB (ref 26.0–34.0)
MCHC: 34.8 g/dL (ref 30.0–36.0)
MCV: 68.5 fL — ABNORMAL LOW (ref 78.0–100.0)
PLATELETS: 323 10*3/uL (ref 150–400)
RBC: 4.82 MIL/uL (ref 3.87–5.11)
RDW: 16.6 % — AB (ref 11.5–15.5)
WBC: 5.2 10*3/uL (ref 4.0–10.5)

## 2014-03-21 NOTE — Patient Instructions (Signed)
Your procedure is scheduled on:  Tuesday, Sept. 1, 2015  Enter through the [employer] [facility] at: 8:00 am  Pick up the phone at the desk and dial [phone].  Call this number if you have problems the morning of surgery: [phone].  Remember: Do NOT eat food: After midnight Monday Do NOT drink clear liquids after: After midnight Monday Take these medicines the morning of surgery with a SIP OF WATER:  Metoprolol  Do NOT wear jewelry (body piercing), metal hair clips/bobby pins, make-up, or nail polish. Do NOT wear lotions, powders, or perfumes.  You may wear deoderant. Do NOT shave for 48 hours prior to surgery. Do NOT bring valuables to the hospital. Contacts, dentures, or bridgework may not be worn into surgery. Leave suitcase in car.  After surgery it may be brought to your room.  For patients admitted to the hospital, checkout time is 11:00 AM the day of discharge.

## 2014-03-27 ENCOUNTER — Ambulatory Visit (HOSPITAL_COMMUNITY): Payer: Managed Care, Other (non HMO) | Admitting: Anesthesiology

## 2014-03-27 ENCOUNTER — Encounter (HOSPITAL_COMMUNITY): Payer: Self-pay | Admitting: Certified Registered Nurse Anesthetist

## 2014-03-27 ENCOUNTER — Encounter (HOSPITAL_COMMUNITY): Payer: Managed Care, Other (non HMO) | Admitting: Anesthesiology

## 2014-03-27 ENCOUNTER — Encounter (HOSPITAL_COMMUNITY)
Admission: RE | Disposition: A | Discharge status: Home / Self Care | Payer: Self-pay | Source: Ambulatory Visit | Attending: Obstetrics & Gynecology

## 2014-03-27 ENCOUNTER — Ambulatory Visit (HOSPITAL_COMMUNITY)
Admission: RE | Admit: 2014-03-27 | Discharge: 2014-03-28 | Disposition: A | Discharge status: Home / Self Care | Payer: Managed Care, Other (non HMO) | Source: Ambulatory Visit | Attending: Obstetrics & Gynecology | Admitting: Obstetrics & Gynecology

## 2014-03-27 DIAGNOSIS — R002 Palpitations: Secondary | ICD-10-CM | POA: Diagnosis not present

## 2014-03-27 DIAGNOSIS — Z9889 Other specified postprocedural states: Secondary | ICD-10-CM

## 2014-03-27 DIAGNOSIS — I4729 Other ventricular tachycardia: Secondary | ICD-10-CM | POA: Insufficient documentation

## 2014-03-27 DIAGNOSIS — D649 Anemia, unspecified: Secondary | ICD-10-CM | POA: Diagnosis not present

## 2014-03-27 DIAGNOSIS — N8000 Endometriosis of the uterus, unspecified: Secondary | ICD-10-CM | POA: Insufficient documentation

## 2014-03-27 DIAGNOSIS — N946 Dysmenorrhea, unspecified: Secondary | ICD-10-CM

## 2014-03-27 DIAGNOSIS — I472 Ventricular tachycardia, unspecified: Secondary | ICD-10-CM | POA: Insufficient documentation

## 2014-03-27 DIAGNOSIS — D251 Intramural leiomyoma of uterus: Secondary | ICD-10-CM | POA: Insufficient documentation

## 2014-03-27 DIAGNOSIS — D252 Subserosal leiomyoma of uterus: Secondary | ICD-10-CM | POA: Diagnosis not present

## 2014-03-27 DIAGNOSIS — N920 Excessive and frequent menstruation with regular cycle: Secondary | ICD-10-CM | POA: Diagnosis not present

## 2014-03-27 DIAGNOSIS — N949 Unspecified condition associated with female genital organs and menstrual cycle: Secondary | ICD-10-CM | POA: Diagnosis present

## 2014-03-27 DIAGNOSIS — Z87891 Personal history of nicotine dependence: Secondary | ICD-10-CM | POA: Diagnosis not present

## 2014-03-27 DIAGNOSIS — Z90710 Acquired absence of both cervix and uterus: Secondary | ICD-10-CM

## 2014-03-27 DIAGNOSIS — D259 Leiomyoma of uterus, unspecified: Secondary | ICD-10-CM

## 2014-03-27 HISTORY — PX: VAGINAL HYSTERECTOMY: SHX2639

## 2014-03-27 HISTORY — DX: Acquired absence of both cervix and uterus: Z90.710

## 2014-03-27 LAB — TYPE AND SCREEN
ABO/RH(D): A POS
ANTIBODY SCREEN: NEGATIVE

## 2014-03-27 LAB — ABO/RH: ABO/RH(D): A POS

## 2014-03-27 LAB — PREGNANCY, URINE: Preg Test, Ur: NEGATIVE

## 2014-03-27 SURGERY — HYSTERECTOMY, VAGINAL
Anesthesia: Monitor Anesthesia Care | Site: Vagina | Laterality: Bilateral

## 2014-03-27 MED ORDER — IBUPROFEN 800 MG PO TABS: 800.0000 mg | ORAL_TABLET | Freq: Three times a day (TID) | ORAL | Status: DC

## 2014-03-27 MED ORDER — MENTHOL 3 MG MT LOZG: 1.0000 | LOZENGE | OROMUCOSAL | Status: DC | PRN

## 2014-03-27 MED ORDER — MIDAZOLAM HCL 2 MG/2ML IJ SOLN
INTRAMUSCULAR | Status: AC
  Filled 2014-03-27: qty 2

## 2014-03-27 MED ORDER — NALBUPHINE HCL 10 MG/ML IJ SOLN: 5.0000 mg | INTRAMUSCULAR | Status: DC | PRN

## 2014-03-27 MED ORDER — CEFAZOLIN SODIUM-DEXTROSE 2-3 GM-% IV SOLR
INTRAVENOUS | Status: AC
  Filled 2014-03-27: qty 50

## 2014-03-27 MED ORDER — ONDANSETRON HCL 4 MG PO TABS: 4.0000 mg | ORAL_TABLET | Freq: Four times a day (QID) | ORAL | Status: DC | PRN

## 2014-03-27 MED ORDER — ACETAMINOPHEN 160 MG/5ML PO SOLN
ORAL | Status: AC
  Filled 2014-03-27: qty 40.6

## 2014-03-27 MED ORDER — SODIUM CHLORIDE 0.9 % IJ SOLN
INTRAMUSCULAR | Status: AC
  Filled 2014-03-27: qty 50

## 2014-03-27 MED ORDER — ESTRADIOL 0.1 MG/GM VA CREA
TOPICAL_CREAM | VAGINAL | Status: AC
  Filled 2014-03-27: qty 42.5

## 2014-03-27 MED ORDER — ONDANSETRON HCL 4 MG/2ML IJ SOLN
INTRAMUSCULAR | Status: AC
  Filled 2014-03-27: qty 2

## 2014-03-27 MED ORDER — KETOROLAC TROMETHAMINE 30 MG/ML IJ SOLN: 30.0000 mg | Freq: Four times a day (QID) | INTRAMUSCULAR | Status: DC

## 2014-03-27 MED ORDER — PROPOFOL INFUSION 10 MG/ML OPTIME
INTRAVENOUS | Status: DC | PRN
  Administered 2014-03-27: 75 ug/kg/min via INTRAVENOUS

## 2014-03-27 MED ORDER — KETOROLAC TROMETHAMINE 30 MG/ML IJ SOLN
INTRAMUSCULAR | Status: AC
  Filled 2014-03-27: qty 1

## 2014-03-27 MED ORDER — ONDANSETRON HCL 4 MG/2ML IJ SOLN: 4.0000 mg | Freq: Four times a day (QID) | INTRAMUSCULAR | Status: DC | PRN

## 2014-03-27 MED ORDER — IBUPROFEN 800 MG PO TABS
800.0000 mg | ORAL_TABLET | Freq: Three times a day (TID) | ORAL | Status: DC
  Administered 2014-03-28: 800 mg via ORAL
  Filled 2014-03-27: qty 1

## 2014-03-27 MED ORDER — ROCURONIUM BROMIDE 100 MG/10ML IV SOLN
INTRAVENOUS | Status: AC
  Filled 2014-03-27: qty 1

## 2014-03-27 MED ORDER — SCOPOLAMINE 1 MG/3DAYS TD PT72
1.0000 | MEDICATED_PATCH | Freq: Once | TRANSDERMAL | Status: DC
  Filled 2014-03-27: qty 1

## 2014-03-27 MED ORDER — MIDAZOLAM HCL 2 MG/2ML IJ SOLN
INTRAMUSCULAR | Status: DC | PRN
  Administered 2014-03-27: 2 mg via INTRAVENOUS

## 2014-03-27 MED ORDER — KETOROLAC TROMETHAMINE 30 MG/ML IJ SOLN
30.0000 mg | Freq: Four times a day (QID) | INTRAMUSCULAR | Status: DC
  Administered 2014-03-27: 30 mg via INTRAVENOUS
  Filled 2014-03-27: qty 1

## 2014-03-27 MED ORDER — DIPHENHYDRAMINE HCL 25 MG PO CAPS: 25.0000 mg | ORAL_CAPSULE | ORAL | Status: DC | PRN

## 2014-03-27 MED ORDER — 0.9 % SODIUM CHLORIDE (POUR BTL) OPTIME
TOPICAL | Status: DC | PRN
  Administered 2014-03-27: 1000 mL

## 2014-03-27 MED ORDER — CEFAZOLIN SODIUM-DEXTROSE 2-3 GM-% IV SOLR
2.0000 g | INTRAVENOUS | Status: AC
  Administered 2014-03-27: 2 g via INTRAVENOUS

## 2014-03-27 MED ORDER — FENTANYL CITRATE 0.05 MG/ML IJ SOLN: 25.0000 ug | INTRAMUSCULAR | Status: DC | PRN

## 2014-03-27 MED ORDER — ACETAMINOPHEN 160 MG/5ML PO SOLN
975.0000 mg | Freq: Once | ORAL | Status: AC
  Administered 2014-03-27: 975 mg via ORAL

## 2014-03-27 MED ORDER — KETOROLAC TROMETHAMINE 30 MG/ML IJ SOLN
INTRAMUSCULAR | Status: DC | PRN
  Administered 2014-03-27: 30 mg via INTRAVENOUS

## 2014-03-27 MED ORDER — OXYCODONE-ACETAMINOPHEN 5-325 MG PO TABS
1.0000 | ORAL_TABLET | ORAL | Status: DC | PRN
  Administered 2014-03-27 (×2): 2 via ORAL
  Administered 2014-03-28: 1 via ORAL
  Filled 2014-03-27 (×2): qty 2
  Filled 2014-03-27: qty 1

## 2014-03-27 MED ORDER — BUPIVACAINE HCL 0.5 % IJ SOLN
INTRAMUSCULAR | Status: DC | PRN
  Administered 2014-03-27: 20 mL

## 2014-03-27 MED ORDER — VASOPRESSIN 20 UNIT/ML IJ SOLN
INTRAVENOUS | Status: DC | PRN
  Administered 2014-03-27: 10:00:00

## 2014-03-27 MED ORDER — PHENYLEPHRINE HCL 10 MG/ML IJ SOLN
INTRAMUSCULAR | Status: DC | PRN
  Administered 2014-03-27 (×2): 40 ug via INTRAVENOUS

## 2014-03-27 MED ORDER — LIDOCAINE HCL (CARDIAC) 20 MG/ML IV SOLN
INTRAVENOUS | Status: DC | PRN
  Administered 2014-03-27: 40 mg via INTRAVENOUS

## 2014-03-27 MED ORDER — OXYCODONE-ACETAMINOPHEN 5-325 MG PO TABS: 1.0000 | ORAL_TABLET | ORAL | Status: DC | PRN

## 2014-03-27 MED ORDER — DIPHENHYDRAMINE HCL 50 MG/ML IJ SOLN: 12.5000 mg | INTRAMUSCULAR | Status: DC | PRN

## 2014-03-27 MED ORDER — PROPOFOL 10 MG/ML IV BOLUS
INTRAVENOUS | Status: DC | PRN
  Administered 2014-03-27: 20 mg via INTRAVENOUS

## 2014-03-27 MED ORDER — BUPIVACAINE IN DEXTROSE 0.75-8.25 % IT SOLN
INTRATHECAL | Status: DC | PRN
  Administered 2014-03-27: 1.5 mL via INTRATHECAL

## 2014-03-27 MED ORDER — DIPHENHYDRAMINE HCL 50 MG/ML IJ SOLN: 25.0000 mg | INTRAMUSCULAR | Status: DC | PRN

## 2014-03-27 MED ORDER — MORPHINE SULFATE 4 MG/ML IJ SOLN
1.0000 mg | INTRAMUSCULAR | Status: DC | PRN
  Administered 2014-03-27: 2 mg via INTRAVENOUS
  Filled 2014-03-27: qty 1

## 2014-03-27 MED ORDER — ONDANSETRON HCL 4 MG/2ML IJ SOLN
INTRAMUSCULAR | Status: DC | PRN
Start: 2014-03-27 — End: 2014-03-27
  Administered 2014-03-27: 4 mg via INTRAVENOUS

## 2014-03-27 MED ORDER — FENTANYL CITRATE 0.05 MG/ML IJ SOLN
INTRAMUSCULAR | Status: AC
  Filled 2014-03-27: qty 5

## 2014-03-27 MED ORDER — FENTANYL CITRATE 0.05 MG/ML IJ SOLN
INTRAMUSCULAR | Status: AC
  Filled 2014-03-27: qty 2

## 2014-03-27 MED ORDER — POLYETHYLENE GLYCOL 3350 17 G PO PACK
17.0000 g | PACK | Freq: Every day | ORAL | Status: DC | PRN
  Filled 2014-03-27: qty 1

## 2014-03-27 MED ORDER — SODIUM CHLORIDE 0.9 % IJ SOLN
3.0000 mL | INTRAMUSCULAR | Status: DC | PRN
Start: 2014-03-27 — End: 2014-03-28

## 2014-03-27 MED ORDER — KETOROLAC TROMETHAMINE 30 MG/ML IJ SOLN
30.0000 mg | Freq: Four times a day (QID) | INTRAMUSCULAR | Status: DC | PRN
Start: 2014-03-27 — End: 2014-03-28
  Filled 2014-03-27: qty 1

## 2014-03-27 MED ORDER — SIMETHICONE 80 MG PO CHEW
80.0000 mg | CHEWABLE_TABLET | Freq: Four times a day (QID) | ORAL | Status: DC | PRN
Start: 2014-03-27 — End: 2014-03-28
  Administered 2014-03-28: 80 mg via ORAL
  Filled 2014-03-27: qty 1

## 2014-03-27 MED ORDER — KETOROLAC TROMETHAMINE 30 MG/ML IJ SOLN
30.0000 mg | Freq: Four times a day (QID) | INTRAMUSCULAR | Status: DC | PRN
Start: 2014-03-27 — End: 2014-03-28

## 2014-03-27 MED ORDER — PROPOFOL 10 MG/ML IV EMUL
INTRAVENOUS | Status: AC
  Filled 2014-03-27: qty 20

## 2014-03-27 MED ORDER — FENTANYL CITRATE 0.05 MG/ML IJ SOLN
INTRAMUSCULAR | Status: DC | PRN
  Administered 2014-03-27: 15 ug via INTRATHECAL
  Administered 2014-03-27: 50 ug via INTRAVENOUS

## 2014-03-27 MED ORDER — DEXTROSE-NACL 5-0.45 % IV SOLN
INTRAVENOUS | Status: DC
  Administered 2014-03-27: 15:00:00 via INTRAVENOUS

## 2014-03-27 MED ORDER — SCOPOLAMINE 1 MG/3DAYS TD PT72
1.0000 | MEDICATED_PATCH | Freq: Once | TRANSDERMAL | Status: DC
Start: 2014-03-27 — End: 2014-03-27

## 2014-03-27 MED ORDER — MEPERIDINE HCL 25 MG/ML IJ SOLN: 6.2500 mg | INTRAMUSCULAR | Status: DC | PRN

## 2014-03-27 MED ORDER — VASOPRESSIN 20 UNIT/ML IJ SOLN
INTRAMUSCULAR | Status: AC
Start: 2014-03-27 — End: 2014-03-27
  Filled 2014-03-27: qty 1

## 2014-03-27 MED ORDER — PANTOPRAZOLE SODIUM 40 MG PO TBEC
40.0000 mg | DELAYED_RELEASE_TABLET | Freq: Every day | ORAL | Status: DC
  Administered 2014-03-27 – 2014-03-28 (×2): 40 mg via ORAL
  Filled 2014-03-27 (×2): qty 1

## 2014-03-27 MED ORDER — SCOPOLAMINE 1 MG/3DAYS TD PT72
MEDICATED_PATCH | TRANSDERMAL | Status: AC
Start: 2014-03-27 — End: 2014-03-27
  Filled 2014-03-27: qty 1

## 2014-03-27 MED ORDER — ONDANSETRON HCL 4 MG/2ML IJ SOLN: 4.0000 mg | Freq: Three times a day (TID) | INTRAMUSCULAR | Status: DC | PRN

## 2014-03-27 MED ORDER — DEXAMETHASONE SODIUM PHOSPHATE 4 MG/ML IJ SOLN
INTRAMUSCULAR | Status: AC
  Filled 2014-03-27: qty 1

## 2014-03-27 MED ORDER — BUPIVACAINE HCL (PF) 0.5 % IJ SOLN
INTRAMUSCULAR | Status: AC
  Filled 2014-03-27: qty 30

## 2014-03-27 MED ORDER — METOCLOPRAMIDE HCL 5 MG/ML IJ SOLN: 10.0000 mg | Freq: Three times a day (TID) | INTRAMUSCULAR | Status: DC | PRN

## 2014-03-27 MED ORDER — ZOLPIDEM TARTRATE 5 MG PO TABS: 5.0000 mg | ORAL_TABLET | Freq: Every evening | ORAL | Status: DC | PRN

## 2014-03-27 MED ORDER — NALOXONE HCL 1 MG/ML IJ SOLN
1.0000 ug/kg/h | INTRAMUSCULAR | Status: DC | PRN
  Filled 2014-03-27: qty 2

## 2014-03-27 MED ORDER — DEXAMETHASONE SODIUM PHOSPHATE 10 MG/ML IJ SOLN
INTRAMUSCULAR | Status: DC | PRN
  Administered 2014-03-27: 4 mg via INTRAVENOUS

## 2014-03-27 MED ORDER — LIDOCAINE HCL (CARDIAC) 20 MG/ML IV SOLN
INTRAVENOUS | Status: AC
  Filled 2014-03-27: qty 5

## 2014-03-27 MED ORDER — NALOXONE HCL 0.4 MG/ML IJ SOLN: 0.4000 mg | INTRAMUSCULAR | Status: DC | PRN

## 2014-03-27 MED ORDER — LACTATED RINGERS IV SOLN
INTRAVENOUS | Status: DC
  Administered 2014-03-27 (×2): via INTRAVENOUS

## 2014-03-27 MED ORDER — LACTATED RINGERS IV SOLN: INTRAVENOUS | Status: DC

## 2014-03-27 SURGICAL SUPPLY — 29 items
BLADE SURG 10 STRL SS (BLADE) ×3 IMPLANT
CANISTER SUCT 3000ML (MISCELLANEOUS) ×3 IMPLANT
CLOTH BEACON ORANGE TIMEOUT ST (SAFETY) ×3 IMPLANT
CONT PATH 16OZ SNAP LID 3702 (MISCELLANEOUS) ×3 IMPLANT
COVER MAYO STAND STRL (DRAPES) IMPLANT
DECANTER SPIKE VIAL GLASS SM (MISCELLANEOUS) ×3 IMPLANT
DRAPE HYSTEROSCOPY (DRAPE) ×3 IMPLANT
DRSG TELFA 3X8 NADH (GAUZE/BANDAGES/DRESSINGS) IMPLANT
GAUZE PACKING 2X5 YD STRL (GAUZE/BANDAGES/DRESSINGS) IMPLANT
GLOVE BIO SURGEON STRL SZ7 (GLOVE) ×3 IMPLANT
GLOVE BIOGEL PI IND STRL 6.5 (GLOVE) ×1 IMPLANT
GLOVE BIOGEL PI IND STRL 7.0 (GLOVE) ×2 IMPLANT
GLOVE BIOGEL PI INDICATOR 6.5 (GLOVE) ×2
GLOVE BIOGEL PI INDICATOR 7.0 (GLOVE) ×4
GOWN STRL REUS W/TWL LRG LVL3 (GOWN DISPOSABLE) ×12 IMPLANT
NEEDLE SPNL 20GX3.5 QUINCKE YW (NEEDLE) ×6 IMPLANT
NS IRRIG 1000ML POUR BTL (IV SOLUTION) ×3 IMPLANT
PACK VAGINAL WOMENS (CUSTOM PROCEDURE TRAY) ×3 IMPLANT
PAD OB MATERNITY 4.3X12.25 (PERSONAL CARE ITEMS) ×3 IMPLANT
SHEARS FOC LG CVD HARMONIC 17C (MISCELLANEOUS) IMPLANT
SUT VIC AB 0 CT1 18XCR BRD8 (SUTURE) ×2 IMPLANT
SUT VIC AB 0 CT1 36 (SUTURE) ×6 IMPLANT
SUT VIC AB 0 CT1 8-18 (SUTURE) ×4
SUT VICRYL 0 TIES 12 18 (SUTURE) ×3 IMPLANT
SYR 30ML LL (SYRINGE) ×3 IMPLANT
SYR CONTROL 10ML LL (SYRINGE) ×3 IMPLANT
TOWEL OR 17X24 6PK STRL BLUE (TOWEL DISPOSABLE) ×6 IMPLANT
TRAY FOLEY CATH 14FR (SET/KITS/TRAYS/PACK) ×3 IMPLANT
WATER STERILE IRR 1000ML POUR (IV SOLUTION) IMPLANT

## 2014-03-27 NOTE — Discharge Instructions (Signed)

## 2014-03-27 NOTE — Anesthesia Postprocedure Evaluation (Signed)
  Anesthesia Post-op Note  Patient: [name]  Procedure(s) Performed: Procedure(s): HYSTERECTOMY VAGINAL WITH BILATERAL SALPINGECTOMY (Bilateral)  Patient Location: Women[facility] Unit  Anesthesia Type:General  Level of Consciousness: awake  Airway and Oxygen Therapy: Patient Spontanous Breathing  Post-op Pain: mild  Post-op Assessment: Patient's Cardiovascular Status Stable and Respiratory Function Stable  Post-op Vital Signs: stable  Last Vitals:  Filed Vitals:   03/27/14 1420  BP: 109/56  Pulse: 62  Temp: 36.8 C  Resp: 18    Complications: No apparent anesthesia complications

## 2014-03-27 NOTE — Addendum Note (Signed)
Addendum created 03/27/14 1535 by Ignacia Bayley, CRNA   Modules edited: Notes Section   Notes Section:  File: [id number]

## 2014-03-27 NOTE — Transfer of Care (Signed)
Immediate Anesthesia Transfer of Care Note  Patient: [name]  Procedure(s) Performed: Procedure(s): HYSTERECTOMY VAGINAL WITH BILATERAL SALPINGECTOMY (Bilateral)  Patient Location: PACU  Anesthesia Type:MAC and Spinal  Level of Consciousness: awake, alert , oriented and patient cooperative  Airway & Oxygen Therapy: Patient Spontanous Breathing  Post-op Assessment: Report given to PACU RN and Post -op Vital signs reviewed and stable  Post vital signs: Reviewed and stable  Complications: No apparent anesthesia complications

## 2014-03-27 NOTE — Anesthesia Preprocedure Evaluation (Addendum)
Anesthesia Evaluation  Patient identified by MRN, date of birth, ID band Patient awake    Reviewed: Allergy & Precautions, H&P , NPO status , Patient's Chart, lab work & pertinent test results  History of Anesthesia Complications Negative for: history of anesthetic complications  Airway Mallampati: III TM Distance: >3 FB Neck ROM: full    Dental  (+) Partial Upper, Poor Dentition, Chipped, Dental Advisory Given   Pulmonary neg pulmonary ROS, former smoker,  breath sounds clear to auscultation  Pulmonary exam normal       Cardiovascular negative cardio ROS  + dysrhythmias (s/p ablation, PRN beta blocker) Supra Ventricular Tachycardia Rhythm:regular Rate:Normal     Neuro/Psych negative neurological ROS  negative psych ROS   GI/Hepatic negative GI ROS, Neg liver ROS,   Endo/Other  negative endocrine ROS  Renal/GU negative Renal ROS  Female GU complaint  negative genitourinary   Musculoskeletal negative musculoskeletal ROS (+)   Abdominal Normal abdominal exam  (+)   Peds negative pediatric ROS (+)  Hematology  (+) anemia ,   Anesthesia Other Findings   Reproductive/Obstetrics negative OB ROS                          Anesthesia Physical Anesthesia Plan  ASA: II  Anesthesia Plan: Combined Spinal and Epidural and MAC   Post-op Pain Management:    Induction:   Airway Management Planned:   Additional Equipment:   Intra-op Plan:   Post-operative Plan:   Informed Consent:   Plan Discussed with: Surgeon and CRNA  Anesthesia Plan Comments:         Anesthesia Quick Evaluation

## 2014-03-27 NOTE — Op Note (Signed)
03/27/2014  10:55 AM  PATIENT:  [name]  48 y.o. female  PRE-OPERATIVE DIAGNOSIS:  PELVIC PAIN,FIBROIDS UTERUS,MENORRHAGIA  POST-OPERATIVE DIAGNOSIS:  PELVIC PAIN,FIBROIDS UTERUS,MENORRHAGIA  PROCEDURE:  Procedure(s): HYSTERECTOMY VAGINAL WITH BILATERAL SALPINGECTOMY (Bilateral)  SURGEON:  Surgeon(s) and Role:    * Lavonia Drafts, MD - Primary    * Guss Bunde, MD - Assisting  ANESTHESIA:   epidural, spinal and IV sedation  EBL:  Total I/O In: 1200 [I.V.:1200] Out: 75 [Urine:25; Blood:50]  BLOOD ADMINISTERED:none  DRAINS: none   LOCAL MEDICATIONS USED:  MARCAINE & dilute vasopressin solution    SPECIMEN:  Source of Specimen:  uterus, cervix and fallopian tubes    DISPOSITION OF SPECIMEN:  PATHOLOGY  COUNTS:  YES  TOURNIQUET:  * No tourniquets in log *  DICTATION: .Note written in EPIC  PLAN OF CARE: admit for prolonged observation   PATIENT DISPOSITION:  PACU - hemodynamically stable.   Delay start of Pharmacological VTE agent (>24hrs) due to surgical blood loss or risk of bleeding: yes  bilateral salpingectomy ANESTHESIA:  General endotracheal.  COMPLICATIONS:  None immediate.  INDICATIONS: The patient is a 48 y.o. [id number] with history of symptomatic uterine fibroids/menorrhagia. The patient made a decision to undergo definite surgical treatment. On the preoperative visit, the risks, benefits, indications, and alternatives of the procedure were reviewed with the patient.  On the day of surgery, the risks of surgery were again discussed with the patient including but not limited to: bleeding which may require transfusion or reoperation; infection which may require antibiotics; injury to bowel, bladder, ureters or other surrounding organs; need for additional procedures; thromboembolic phenomenon, incisional problems and other postoperative/anesthesia complications. Written informed consent was obtained.    DESCRIPTION OF PROCEDURE:  The patient  received intravenous antibiotics and had sequential compression devices applied to her lower extremities while in the preoperative area.  She was then taken to the operating room where general anesthesia was administered and was found to be adequate.  She was placed in the dorsal lithotomy position, and was prepped and draped in a sterile manner.  The patients bladder was drained with a red rubber catheter. After an adequate timeout was performed, attention was turned to her pelvis.  A weighted speculum was then placed in the vagina, and the anterior and posterior lips of the cervix were grasped bilaterally with tenaculums.  A paracervical block of 20cc of 0.5% Marcaine was injected into the cervix. The cervix was then injected circumferentially with a dilute Vasopression solution.  The cervix was then circumferentially incised, and the bladder was dissected off the pubocervical fascia without complication.  The posterior cul-de-sac was entered sharply without difficulty. A suture was placed on the posterior vagina.  A long weighted speculum was inserted into the posterior cul-de-sac.  The Heaney clamp was then used to clamp the uterosacral ligaments on either side.  They were then cut and sutured ligated with 0 Vicryl, and the ligated uterosacral ligaments were transfixed to the posterior lateral vaginal epithelium to further support the vagina and provide hemostasis. Of note, all sutures used in this case were 0 Vicryl unless otherwise noted.   The cardinal ligaments were then clamped, cut and ligated. The anterior cul-de-sac was then entered sharpely. The uterine vessels and broad ligaments were then serially clamped with the Heaney clamps, cut, and suture ligated on both sides.  Excellent hemostasis was noted at this point.  The uterus was then delivered via the posterior cul-de-sac, and the cornua were clamped with  the Heaney clamps, transected, and the uterus was delivered and sent to pathology. These pedicles  were then suture ligated to ensure hemostasis.  After completion of the hysterectomy, The fallopian tube on the left side was grasped with a Kelly clamp, transected and suture ligated.  The contralateral side was also clamped, transected and ligated. All pedicles from the uterosacral ligament to the cornua were examined hemostasis was confirmed.  The vaginal cuff was reefed in a running locked fashion then reapproximated using figure of eight sutures care was given to incorporate the uterosacral pedicles bilaterally.  All instruments were then removed from the pelvis and a vaginal packing saturated with estrogen cream was placed.  The patient tolerated the procedure well.  All instruments, needles, and sponge counts were correct x 2. The patient was taken to the recovery room in stable condition.

## 2014-03-27 NOTE — H&P (Signed)
Patient ID: [name], female DOB: 29-May-1966, 48 y.o. MRN: [id number]  HPI Pt reports being seen multiple times for eval for pelvic pain. She is here to f/u on sono and discuss treatement. She reports SVD x4. Denies intraabdominal surgeries.  Past Medical History   Diagnosis  Date   .  History of uterine fibroid    .  Menorrhagia    .  Dysmenorrhea  2011   .  Difficulty sleeping    .  Chest pain    .  Supraventricular tachycardia    .  Heart palpitations    .  Shortness of breath      "associated w/high heart rate" (01/04/2013)   .  Chronic anemia     Past Surgical History   Procedure  Laterality  Date   .  Tubal ligation     .  Combined augmentation mammaplasty and abdominoplasty  Bilateral  2002   .  Cardiac electrophysiology mapping and ablation   ~ 2008; 01/04/2013     "weren't able to do the ablation" (01/04/2013)   .  Uterine fibroid surgery   ?09/2012   .  Colonoscopy   12/12/2013    Current Outpatient Prescriptions on File Prior to Visit   Medication  Sig  Dispense  Refill   .  metoprolol (LOPRESSOR) 50 MG tablet  Take 50 mg by mouth at bedtime as needed. For hypertension      No current facility-administered medications on file prior to visit.   No Known Allergies  History    Social History   .  Marital Status:  Legally Separated     Spouse Name:  N/A     Number of Children:  4   .  Years of Education:  N/A    Occupational History   .  order picker  [employer]    Social History Main Topics   .  Smoking status:  Former Smoker -- 0.33 packs/day for 3 years     Types:  Cigarettes     Quit date:  12/27/1984   .  Smokeless tobacco:  Never Used   .  Alcohol Use:  Yes      Comment: 01/04/2013 "2-3 beers q other weekend"   .  Drug Use:  No   .  Sexual Activity:  Yes     Birth Control/ Protection:  None    Other Topics  Concern   .  Not on file    Social History Narrative   .  No narrative on file    Family History   Problem  Relation  Age of Onset    .  Cancer  Brother    .  Cancer  Paternal Aunt      x 2 unkown type   .  Heart disease  Maternal Grandmother    .  Heart Problems  Mother      apid heartbeat   .  Colon cancer  Neg Hx    Review of Systems  Objective:   Physical Exam BP 117/81  Pulse 69  Temp(Src) 98.1 F (36.7 C) (Oral)  Resp 18  SpO2 100%  Pt in NAD  Abd: well healed abdominoplasty incision  GU:  EGBUS: no lesions  Vagina: no blood in vault  Cervix: no lesion; no mucopurulent d/c  Uterus: ~10 weeks sized with irregular contour; mobile with good descensus.  Adnexa: no masses; non tender    CBC    Component Value Date/Time  WBC 5.2 03/21/2014 1515   RBC 4.82 03/21/2014 1515   HGB 11.5* 03/21/2014 1515   HCT 33.0* 03/21/2014 1515   PLT 323 03/21/2014 1515   MCV 68.5* 03/21/2014 1515   MCH 23.9* 03/21/2014 1515   MCHC 34.8 03/21/2014 1515   RDW 16.6* 03/21/2014 1515   LYMPHSABS 2.9 09/25/2013 1606   MONOABS 0.5 09/25/2013 1606   EOSABS 0.1 09/25/2013 1606   BASOSABS 0.1 09/25/2013 1606     11/22/2013 Diagnosis NEGATIVE FOR INTRAEPITHELIAL LESIONS OR MALIGNANCY. TANYA SPEED Cytotechnologist Electronic Signature (Case signed 11/24/2013) Source CervicoVaginal Pap [ThinPrep Imaged] Ancillary Testing HPV High Risk High Risk HPV: NOT DETECTED Completed by 11/29/2013  EXAM:  TRANSVAGINAL ULTRASOUND OF PELVIS  TECHNIQUE:  Transvaginal ultrasound examination of the pelvis was performed  including evaluation of the uterus, ovaries, adnexal regions, and  pelvic cul-de-sac.  COMPARISON: 10/02/2013  FINDINGS:  Uterus  Measurements: 10.7 x 6.0 x 7.2 cm. Right posterior fibroid measures  3 x 3 x 3 cm with slight mass effect on the endometrium. Posterior  right subserosal fibroid measures 2.8 x 2.7 x 1.4 cm. Left  intramural fibroid measures 1.6 x 1.4 x 0.9 cm.  Endometrium  Thickness: 15 mm in thickness. No focal abnormality visualized.  Right ovary  Measurements: 3.5 x 1.7 x 2.0 cm. Normal appearance/no  adnexal mass.  Left ovary  Measurements: Not visualized. No adnexal masses seen.  Other findings: No free fluid  IMPRESSION:  At least 3 fibroids as described above.  Assessment:   Uterine fibroids- pt desires definitive treatment with hyst. She declines IUD or OCPs  Plan:   Patient desires surgical management with total vaginal hysterectomy with bilateral salpingectomy. The risks of surgery were discussed in detail with the patient including but not limited to: bleeding which may require transfusion or reoperation; infection which may require prolonged hospitalization or re-hospitalization and antibiotic therapy; injury to bowel, bladder, ureters and major vessels or other surrounding organs; need for additional procedures including laparotomy; thromboembolic phenomenon, incisional problems and other postoperative or anesthesia complications. Patient was told that the likelihood that her condition and symptoms will be treated effectively with this surgical management was very high; the postoperative expectations were also discussed in detail. The patient also understands the alternative treatment options which were discussed in full. All questions were answered.

## 2014-03-27 NOTE — Brief Op Note (Signed)
03/27/2014  10:55 AM  PATIENT:  [name]  48 y.o. female  PRE-OPERATIVE DIAGNOSIS:  PELVIC PAIN,FIBROIDS UTERUS,MENORRHAGIA  POST-OPERATIVE DIAGNOSIS:  PELVIC PAIN,FIBROIDS UTERUS,MENORRHAGIA  PROCEDURE:  Procedure(s): HYSTERECTOMY VAGINAL WITH BILATERAL SALPINGECTOMY (Bilateral)  SURGEON:  Surgeon(s) and Role:    * Lavonia Drafts, MD - Primary    * Guss Bunde, MD - Assisting  ANESTHESIA:   epidural, spinal and IV sedation  EBL:  Total I/O In: 1200 [I.V.:1200] Out: 75 [Urine:25; Blood:50]  BLOOD ADMINISTERED:none  DRAINS: none   LOCAL MEDICATIONS USED:  MARCAINE & dilute vasopressin solution    SPECIMEN:  Source of Specimen:  uterus, cervix and fallopian tubes    DISPOSITION OF SPECIMEN:  PATHOLOGY  COUNTS:  YES  TOURNIQUET:  * No tourniquets in log *  DICTATION: .Note written in EPIC  PLAN OF CARE: admit for prolonged observation   PATIENT DISPOSITION:  PACU - hemodynamically stable.   Delay start of Pharmacological VTE agent (>24hrs) due to surgical blood loss or risk of bleeding: yes

## 2014-03-27 NOTE — Anesthesia Postprocedure Evaluation (Addendum)
Anesthesia Post Note  Patient: [name]  Procedure(s) Performed: Procedure(s) (LRB): HYSTERECTOMY VAGINAL WITH BILATERAL SALPINGECTOMY (Bilateral)  Anesthesia type: Spinal  Patient location: PACU  Post pain: Pain level controlled  Post assessment: Post-op Vital signs reviewed  Last Vitals:  Filed Vitals:   03/27/14 1130  BP: 111/64  Pulse: 51  Temp:   Resp: 17    Post vital signs: Reviewed  Level of consciousness: awake  Complications: No apparent anesthesia complications

## 2014-03-27 NOTE — Anesthesia Procedure Notes (Signed)
Spinal  Patient location during procedure: OR Start time: 03/27/2014 9:45 AM Staffing Anesthesiologist: Earney Navy Performed by: anesthesiologist  Preanesthetic Checklist Completed: patient identified, site marked, surgical consent, pre-op evaluation, timeout performed, IV checked, risks and benefits discussed and monitors and equipment checked Spinal Block Patient position: sitting Prep: DuraPrep Patient monitoring: heart rate, continuous pulse ox and blood pressure Approach: midline Location: L3-4 Injection technique: single-shot Needle Needle type: Tuohy and Pencan  Needle gauge: 27 G Needle length: 10 cm Needle insertion depth: 4 cm Catheter type: closed end flexible Catheter size: 19 g Catheter at skin depth: 9 cm Assessment Sensory level: T6 Additional Notes CSE for surgical procedure. LOR to saline at 4 cm, pencan via toughy by immediate clear free flowing CSF, SAB dose given, no parathesia, toughy flushed with additional saline. Catheter threaded easily, no paresthesia, no heme. Epidural secured at 5cm at skin. EPIDURAL CATHETER NOT TESTED. Patient tolerated procedure well with no apparent complications.

## 2014-03-28 ENCOUNTER — Encounter (HOSPITAL_COMMUNITY): Payer: Self-pay | Admitting: Obstetrics & Gynecology

## 2014-03-28 DIAGNOSIS — N949 Unspecified condition associated with female genital organs and menstrual cycle: Secondary | ICD-10-CM | POA: Diagnosis not present

## 2014-03-28 LAB — CBC
HCT: 30.9 % — ABNORMAL LOW (ref 36.0–46.0)
HEMOGLOBIN: 10.8 g/dL — AB (ref 12.0–15.0)
MCH: 23.9 pg — AB (ref 26.0–34.0)
MCHC: 35 g/dL (ref 30.0–36.0)
MCV: 68.5 fL — AB (ref 78.0–100.0)
Platelets: 303 10*3/uL (ref 150–400)
RBC: 4.51 MIL/uL (ref 3.87–5.11)
RDW: 16.4 % — ABNORMAL HIGH (ref 11.5–15.5)
WBC: 13.9 10*3/uL — ABNORMAL HIGH (ref 4.0–10.5)

## 2014-03-28 NOTE — Discharge Summary (Signed)
Physician Discharge Summary  Patient ID: [name] MRN: [id number] DOB/AGE: 10/06/1965 48 y.o.  Admit date: 03/27/2014 Discharge date: 03/28/2014  Admission Diagnoses: pelvic pain; uterine fibroids  Discharge Diagnoses: same Active Problems:   Post-operative state   Discharged Condition: good  Hospital Course: Pt was admitted for total vaginal hysterectomy with bilateral salpingectomy.  She was unable to void until 9pm and stayed overnight.  She reports mild abd pain. No vaginal bleeding. She tolerated a regular diet overnight. She reports that she feels ready to go home.    Consults: None  Significant Diagnostic Studies: labs: CBC  Treatments: surgery:  total vaginal hysterectomy with bilateral salpingectomy  Discharge Exam: Blood pressure 107/67, pulse 63, temperature 98.6 F (37 C), temperature source Oral, resp. rate 18, height 4' 10.5" (1.486 m), weight 135 lb (61.236 kg), SpO2 100.00%. General appearance: alert and no distress GI: soft, non-tender; bowel sounds normal; no masses,  no organomegaly Extremities: extremities normal, atraumatic, no cyanosis or edema \ CBC    Component Value Date/Time   WBC 13.9* 03/28/2014 0517   RBC 4.51 03/28/2014 0517   HGB 10.8* 03/28/2014 0517   HCT 30.9* 03/28/2014 0517   PLT 303 03/28/2014 0517   MCV 68.5* 03/28/2014 0517   MCH 23.9* 03/28/2014 0517   MCHC 35.0 03/28/2014 0517   RDW 16.4* 03/28/2014 0517   LYMPHSABS 2.9 09/25/2013 1606   MONOABS 0.5 09/25/2013 1606   EOSABS 0.1 09/25/2013 1606   BASOSABS 0.1 09/25/2013 1606     Disposition: 01-Home or Self Care  Discharge Instructions   Call MD for:  difficulty breathing, headache or visual disturbances    Complete by:  As directed      Call MD for:  hives    Complete by:  As directed      Call MD for:  persistant dizziness or light-headedness    Complete by:  As directed      Call MD for:  persistant nausea and vomiting    Complete by:  As directed      Call MD for:  redness,  tenderness, or signs of infection (pain, swelling, redness, odor or green/yellow discharge around incision site)    Complete by:  As directed      Call MD for:  severe uncontrolled pain    Complete by:  As directed      Call MD for:  temperature >100.4    Complete by:  As directed      Diet - low sodium heart healthy    Complete by:  As directed      Driving Restrictions    Complete by:  As directed   No driving while on pain medication     Increase activity slowly    Complete by:  As directed      Lifting restrictions    Complete by:  As directed   No heavy lifting for 4 weeks     Sexual Activity Restrictions    Complete by:  As directed   Nothing in vagina for 6 weeks.  No intercourse for 6 weeks            Medication List         ibuprofen 800 MG tablet  Commonly known as:  ADVIL,MOTRIN  Take 1 tablet (800 mg total) by mouth 3 (three) times daily.     metoprolol 50 MG tablet  Commonly known as:  LOPRESSOR  Take 50 mg by mouth daily as needed (for heart palpations).  oxyCODONE-acetaminophen 5-325 MG per tablet  Commonly known as:  PERCOCET/ROXICET  Take 1-2 tablets by mouth every 4 (four) hours as needed for severe pain (moderate to severe pain (when tolerating fluids)).     traMADol 50 MG tablet  Commonly known as:  ULTRAM  Take 1 tablet (50 mg total) by mouth every 6 (six) hours as needed for moderate pain or severe pain.           Follow-up Information   Follow up with Lavonia Drafts, MD In 2 weeks.   Specialty:  Obstetrics and Gynecology   Contact information:   [place] [address] [phone]       Signed: Lavonia Drafts 03/28/2014, 8:14 AM

## 2014-04-11 ENCOUNTER — Ambulatory Visit (INDEPENDENT_AMBULATORY_CARE_PROVIDER_SITE_OTHER): Payer: Managed Care, Other (non HMO) | Admitting: Obstetrics & Gynecology

## 2014-04-11 ENCOUNTER — Encounter: Payer: Self-pay | Admitting: Obstetrics & Gynecology

## 2014-04-11 VITALS — BP 124/84 | HR 92 | Ht <= 58 in | Wt 135.8 lb

## 2014-04-11 DIAGNOSIS — Z9889 Other specified postprocedural states: Secondary | ICD-10-CM

## 2014-04-11 NOTE — Progress Notes (Signed)
Subjective:     Patient ID: [name], female   DOB: 09-22-1965, 48 y.o.   MRN: [id number]  HPI Pt presents for her 2 week post op check.  She denies problems.  She reports passing her stool and urine without difficulty. She is taking only occ pain meds.   Review of Systems     Objective:   Physical ExamPt in NAD Abd; Soft, NT, ND     03/27/2014 Diagnosis Uterus and bilateral fallopian tubes, with cervix (weight 201.4g) - LEIOMYOMATA. - ADENOMYOSIS. - ENDOMETRIUM: BENIGN PROLIFERATIVE ENDOMETRIUM, NO ATYPIA, HYPERPLASIA, OR MALIGNANCY. - CERVIX: BENIGN SQUAMOUS MUCOSA AND ENDOCERVICAL MUCOSA, NO DYSPLASIA OR MALIGNANCY. - BILATERAL FALLOPIAN TUBES: BENIGN FALLOPIAN TUBAL TISSUE, NO ATYPIA OR MALIGNANCY. Assessment:     Pt with 2 week f/u s/p TVH with bilateral salpingectomy doing well    Plan:     Gradual increase in activity F/u in 4 weeks NOTHING per vagina Reviewed pathology

## 2014-04-11 NOTE — Patient Instructions (Signed)

## 2014-04-13 ENCOUNTER — Encounter: Payer: Self-pay | Admitting: *Deleted

## 2014-04-13 ENCOUNTER — Telehealth: Payer: Self-pay | Admitting: *Deleted

## 2014-04-13 NOTE — Progress Notes (Signed)
FMLA papers completed, copy for patient in front office.

## 2014-04-13 NOTE — Telephone Encounter (Signed)
Contacted patient and informed that FMLA paperwork is completed.  Copy in front office for patient to pick up.  Pt verbalizes understanding.

## 2014-05-09 ENCOUNTER — Ambulatory Visit: Payer: Managed Care, Other (non HMO) | Admitting: Obstetrics & Gynecology

## 2014-05-21 ENCOUNTER — Encounter: Payer: Self-pay | Admitting: Obstetrics & Gynecology

## 2014-05-21 ENCOUNTER — Ambulatory Visit (INDEPENDENT_AMBULATORY_CARE_PROVIDER_SITE_OTHER): Payer: Managed Care, Other (non HMO) | Admitting: Obstetrics & Gynecology

## 2014-05-21 VITALS — BP 108/73 | HR 88 | Temp 98.6°F | Ht 59.0 in | Wt 138.6 lb

## 2014-05-21 DIAGNOSIS — Z9889 Other specified postprocedural states: Secondary | ICD-10-CM

## 2014-05-21 NOTE — Patient Instructions (Signed)
Post-operative Instructions after Hysterectomy  Activity  During the first few days at home, you will need to listen to your body.  It is important to be up and move around.  It is equally important to rest when you feel tired.  You should not, however, just lie in bed for days.  This will make you stiff and does increase your risk of clot formation right after your surgery.  Try to increase activity level slowly each day.  You may climb stairs. Go slowly at first.  No heavy lifting (>pounds) for at least four weeks.  Heavy household chores like vacuuming, mopping, pushing furniture, and activities that require repetitive reaching should be avoided.  You can cook if it doesn't make you uncomfortable.  You can do laundry, just be careful when taking items in and out of the washer/dryer.  Do not lift a heavy laundry basket.  You can go grocery shopping if it doesn't make you too tired.  In general, you can bend and you can lift light objects but you must listen to your body.  If it hurts, don't do it.  You can drive in 7-10 days after surgery.  This is not a rule but a guideline. If you feel you can can comfortably sit behind the wheel, turn your head, and hit the breakes hard, then you can drive.  If you are taking narcotic pain medication during the day, then you SHOULD NOT DRIVE.  Nothing in the vagina for six weeks, including tampons, douching, or engaging in sexual activity.  You can shower the day you get home from the hospital. No tub baths for one week.  Pain Management  You will have a prescription for medication containing narcotics, either Vicodin 5/500 or Percocet 5/325. You can take 2 tablets every 4-6 hours as needed for pain.  Only take this when you are hurting, not just every four hours because the directions say you can.  You should find that each day the pain is improved and that you can take less and/or spread out the time between doses.  You will also be given a  prescription for Motrin 800 mg or advised to use over the counter Motrin 200 mg.  Generic Ibuprofen is the same as Motrin.  You can take 800 mg (or 4-200 mg tablets) every 8 with hours. Alternating this with your narcotic pain medication will help you use less pain medication.  You should not exceed taking 2000 mg of Tylenol. This equals either four Vicodin or six Percocet.  You may find that for a day or two you require more medication than stated but this should not continue for several days.  If it does, please call the office so your medication can be changed.  Most narcotic pain medications require a hand written prescription.  If we need to change your medication, enough time is needed for a family member or friend to get to the office to pick up the prescription.  You can use a heating pad. This is especially good for pelvic cramping.  Incision Care  Your incision will either have little small paper strips on the (Steri-strips) or have skin super glue (Dermabond).  Do not pick these off.  They will get loose over the first two weeks after surgery.  When they are about to fall off, you can then remove them.  Use soap and water only to clean the incisions. An antibacterial soap like Dial is good to use.   Once the   strips or glue comes off , you can use Neosporin, if you like.  After about three weeks, if you want to use something to help the scars go away faster; you may use Mederma or Vitamin E.  Use a small amount twice daily on the scars and rub it in good.  If you have any redness or draining from the incision that makes you think there is infection, pleas call the office.  Urination and Bowel Movements  A catheter is used to drain your bladder during the surgery.   You may have some stinging the first few days after surgery when you urinate.  This is normal.  It should go away quickly.  Some will experience bladder spasms for several days after surgery. This feels like a pain at the  end of emptying your bladder.  This is not worrisome.  If it is getting worse or more intense, there are some medications tat can be used short term to help.  You will need to call the office.   Most women will have a bowel movement 2-4 days after surgery.  It takes this long because you will probably d a bowel prep before surgery, because your food intake is often decreased and because narcotics pain medications can cause constipation.  To help prevent constipation, drink lots of fluids and stay hydrated.  You should use a mild stool softener like over the counter Colace (Docusate Sodium) twice daily.  Also, drinking warm liquids like coffee, tea, and hot chocolate can help.  Moving around helps too.  Stop the stool softener when you are having regular bowel movements.  If the constipation is severe, you can progress to using Miralax (an over the counter product that you mix with a small amount of water) or use an enema.  DO NOT USE ANY LAXATIVES.  If you have questions, call the office.  Vaginal Care  You do not need to do anything special.  Just shower normally .  Expect spotting or light bleeding. Mathis is different for everyone and can be as little as spotting for a couple of days after surgery or spotting/light bleeding for weeks.  Do not use tampons.  You will need to wear a mini pad.  Important Reasons to Call the Office  Fever > 100.5. This is a real post-operative temperature and you will need to be seen.  Heavy vaginal bleeding like a period.  Unusual vaginal discharge or odor.  Redness or drainage from your incisions that look like infection.   

## 2014-05-21 NOTE — Progress Notes (Signed)
Subjective:     Patient ID: [name], female   DOB: 1965/09/07, 48 y.o.   MRN: [id number]  HPI Pt denies complaints.  She reports no abd bleeding or discharge,  She has no been sexually active.  She reports feeling well   Review of Systems     Objective:   Physical Exam BP 108/73  Pulse 88  Temp(Src) 98.6 F (37 C) (Oral)  Ht 4\' 11"  (1.499 m)  Wt 138 lb 9.6 oz (62.869 kg)  BMI 27.98 kg/m2  LMP 03/15/2014 Pt in NAD Abd: soft, NT< ND GU: EGBUS: no lesions Vagina: no blood in vault; cuff well healed         Assessment:     6 week post op check- doing well  Reviewed routine post op care    Plan:     F/u in 3 months prn May RTW with no restrictions

## 2014-05-28 ENCOUNTER — Encounter: Payer: Self-pay | Admitting: Obstetrics & Gynecology

## 2014-05-30 ENCOUNTER — Encounter: Payer: Self-pay | Admitting: *Deleted

## 2014-07-05 ENCOUNTER — Encounter (HOSPITAL_COMMUNITY): Payer: Self-pay | Admitting: Internal Medicine

## 2015-02-05 ENCOUNTER — Encounter (HOSPITAL_COMMUNITY): Payer: Self-pay | Admitting: Emergency Medicine

## 2015-02-05 ENCOUNTER — Emergency Department (INDEPENDENT_AMBULATORY_CARE_PROVIDER_SITE_OTHER)
Admission: EM | Admit: 2015-02-05 | Discharge: 2015-02-05 | Disposition: A | Discharge status: Home / Self Care | Payer: Managed Care, Other (non HMO) | Source: Home / Self Care | Attending: Family Medicine | Admitting: Family Medicine

## 2015-02-05 DIAGNOSIS — S39012A Strain of muscle, fascia and tendon of lower back, initial encounter: Secondary | ICD-10-CM

## 2015-02-05 MED ORDER — KETOROLAC TROMETHAMINE 30 MG/ML IJ SOLN
INTRAMUSCULAR | Status: AC
  Filled 2015-02-05: qty 1

## 2015-02-05 MED ORDER — DICLOFENAC POTASSIUM 50 MG PO TABS: 50.0000 mg | ORAL_TABLET | Freq: Three times a day (TID) | ORAL | Status: DC

## 2015-02-05 MED ORDER — CYCLOBENZAPRINE HCL 5 MG PO TABS: 5.0000 mg | ORAL_TABLET | Freq: Three times a day (TID) | ORAL | Status: DC | PRN

## 2015-02-05 MED ORDER — KETOROLAC TROMETHAMINE 30 MG/ML IJ SOLN
30.0000 mg | Freq: Once | INTRAMUSCULAR | Status: AC
  Administered 2015-02-05: 30 mg via INTRAMUSCULAR

## 2015-02-05 NOTE — ED Notes (Signed)
C/O lower back pain.  No pain, numbness, or tingling in legs.  No bowel or bladder issues.  Pain for 3 days.  Patient bent over to pick up shoes and immediately felt pain.

## 2015-02-05 NOTE — ED Provider Notes (Signed)
CSN: [id number]     Arrival date & time 02/05/15  1300 History   First MD Initiated Contact with Patient 02/05/15 1311     Chief Complaint  Patient presents with  . Back Pain   (Consider location/radiation/quality/duration/timing/severity/associated sxs/prior Treatment) Patient is a 49 y.o. female presenting with back pain. The history is provided by the patient.  Back Pain Location:  Lumbar spine Quality:  Stiffness and shooting Radiates to:  Does not radiate Pain severity:  Moderate Onset quality:  Sudden (bending over to pick up shoe.) Duration:  3 days Timing:  Constant Chronicity:  New Relieved by:  None tried Worsened by:  Nothing tried Ineffective treatments:  None tried   Past Medical History  Diagnosis Date  . History of uterine fibroid   . Menorrhagia   . Dysmenorrhea 2011  . Difficulty sleeping   . Chest pain   . Supraventricular tachycardia   . Heart palpitations   . Shortness of breath     "associated w/high heart rate" (01/04/2013)  . Chronic anemia   . Dysrhythmia     irregular heart beats   Past Surgical History  Procedure Laterality Date  . Tubal ligation    . Combined augmentation mammaplasty and abdominoplasty Bilateral 2002  . Cardiac electrophysiology mapping and ablation  ~ 2008; 01/04/2013    "weren't able to do the ablation" (01/04/2013)  . Uterine fibroid surgery  ?09/2012  . Colonoscopy  12/12/2013  . Vaginal hysterectomy Bilateral 03/27/2014    Procedure: HYSTERECTOMY VAGINAL WITH BILATERAL SALPINGECTOMY;  Surgeon: Lavonia Drafts, MD;  Location: [place] [facility];  Service: Gynecology;  Laterality: Bilateral;  . Electrophysiology study N/A 01/04/2013    Procedure: ELECTROPHYSIOLOGY STUDY;  Surgeon: Evans Lance, MD;  Location: [facility] CATH LAB;  Service: Cardiovascular;  Laterality: N/A;  . Supraventricular tachycardia ablation N/A 01/04/2013    Procedure: SUPRAVENTRICULAR TACHYCARDIA ABLATION;  Surgeon: Evans Lance, MD;  Location: [facility] CATH LAB;   Service: Cardiovascular;  Laterality: N/A;   Family History  Problem Relation Age of Onset  . Cancer Brother   . Cancer Paternal Aunt     x 2 unkown type  . Heart disease Maternal Grandmother   . Heart Problems Mother     apid heartbeat  . Colon cancer Neg Hx    History  Substance Use Topics  . Smoking status: Former Smoker -- 0.33 packs/day for 3 years    Types: Cigarettes    Quit date: 12/27/1984  . Smokeless tobacco: Never Used  . Alcohol Use: Yes     Comment: 01/04/2013 "2-3 beers q other weekend"   OB History    Gravida Para Term Preterm AB TAB SAB Ectopic Multiple Living   3 3 3  0 0 0 0 0 1 4     Review of Systems  Constitutional: Negative.   Gastrointestinal: Negative.   Genitourinary: Negative.  Negative for menstrual problem.  Musculoskeletal: Positive for back pain. Negative for myalgias and gait problem.  Skin: Negative.     Allergies  Review of patient's allergies indicates no known allergies.  Home Medications   Prior to Admission medications   Medication Sig Start Date End Date Taking? Authorizing Provider  acetaminophen (TYLENOL) 325 MG tablet Take 650 mg by mouth every 6 (six) hours as needed.   Yes Historical Provider, MD  Naproxen Sodium (ALEVE PO) Take by mouth.   Yes Historical Provider, MD  cyclobenzaprine (FLEXERIL) 5 MG tablet Take 1 tablet (5 mg total) by mouth 3 (three) times daily as  needed for muscle spasms. 02/05/15   Billy Fischer, MD  diclofenac (CATAFLAM) 50 MG tablet Take 1 tablet (50 mg total) by mouth 3 (three) times daily. For back pain 02/05/15   Billy Fischer, MD  ibuprofen (ADVIL,MOTRIN) 800 MG tablet Take 1 tablet (800 mg total) by mouth 3 (three) times daily. 03/28/14   Lavonia Drafts, MD  metoprolol (LOPRESSOR) 50 MG tablet Take 50 mg by mouth daily as needed (for heart palpations).    Historical Provider, MD  oxyCODONE-acetaminophen (PERCOCET/ROXICET) 5-325 MG per tablet Take 1-2 tablets by mouth every 4 (four) hours as  needed for severe pain (moderate to severe pain (when tolerating fluids)). Patient not taking: Reported on 02/05/2015 03/27/14   Lavonia Drafts, MD  traMADol (ULTRAM) 50 MG tablet Take 1 tablet (50 mg total) by mouth every 6 (six) hours as needed for moderate pain or severe pain. Patient not taking: Reported on 02/05/2015 02/15/14   Lavonia Drafts, MD   LMP 03/15/2014 Physical Exam  Constitutional: She is oriented to person, place, and time. She appears well-developed and well-nourished. No distress.  Abdominal: Soft. Bowel sounds are normal.  Musculoskeletal: She exhibits tenderness.       Lumbar back: She exhibits decreased range of motion, tenderness, pain and spasm. She exhibits no swelling and normal pulse.       Back:  Neurological: She is alert and oriented to person, place, and time. She displays normal reflexes.  Skin: Skin is warm and dry.  Nursing note and vitals reviewed.   ED Course  Procedures (including critical care time) Labs Review Labs Reviewed - No data to display  Imaging Review No results found.   MDM   1. Low back strain, initial encounter        Billy Fischer, MD 02/05/15 1348

## 2015-02-05 NOTE — ED Notes (Signed)
Delay in discharge secondary to post injection delay prior to leaving department

## 2015-05-23 ENCOUNTER — Ambulatory Visit: Payer: Managed Care, Other (non HMO) | Admitting: Obstetrics and Gynecology

## 2015-07-10 ENCOUNTER — Ambulatory Visit (INDEPENDENT_AMBULATORY_CARE_PROVIDER_SITE_OTHER): Payer: Managed Care, Other (non HMO) | Admitting: Obstetrics and Gynecology

## 2015-07-10 ENCOUNTER — Encounter: Payer: Self-pay | Admitting: Obstetrics and Gynecology

## 2015-07-10 ENCOUNTER — Other Ambulatory Visit: Payer: Self-pay | Admitting: Obstetrics and Gynecology

## 2015-07-10 VITALS — BP 127/79 | HR 78 | Temp 97.8°F | Wt 142.7 lb

## 2015-07-10 DIAGNOSIS — Z01419 Encounter for gynecological examination (general) (routine) without abnormal findings: Secondary | ICD-10-CM

## 2015-07-10 DIAGNOSIS — Z1231 Encounter for screening mammogram for malignant neoplasm of breast: Secondary | ICD-10-CM

## 2015-07-10 NOTE — Progress Notes (Signed)
Subjective:     [name] is a 49 y.o. female s/p vaginal hysterectomy in 03/2014 due to fibroid and menorrhagia who is here for a comprehensive physical exam. The patient reports no problems since her surgical interventions. Patient is sexually active without complaints. She denies any urinary incontinence.   Past Medical History  Diagnosis Date  . History of uterine fibroid   . Menorrhagia   . Dysmenorrhea 2011  . Difficulty sleeping   . Chest pain   . Supraventricular tachycardia ([place])   . Heart palpitations   . Shortness of breath     "associated w/high heart rate" (01/04/2013)  . Chronic anemia   . Dysrhythmia     irregular heart beats   Past Surgical History  Procedure Laterality Date  . Tubal ligation    . Combined augmentation mammaplasty and abdominoplasty Bilateral 2002  . Cardiac electrophysiology mapping and ablation  ~ 2008; 01/04/2013    "weren't able to do the ablation" (01/04/2013)  . Uterine fibroid surgery  ?09/2012  . Colonoscopy  12/12/2013  . Vaginal hysterectomy Bilateral 03/27/2014    Procedure: HYSTERECTOMY VAGINAL WITH BILATERAL SALPINGECTOMY;  Surgeon: Lavonia Drafts, MD;  Location: [place] [facility];  Service: Gynecology;  Laterality: Bilateral;  . Electrophysiology study N/A 01/04/2013    Procedure: ELECTROPHYSIOLOGY STUDY;  Surgeon: Evans Lance, MD;  Location: [facility] CATH LAB;  Service: Cardiovascular;  Laterality: N/A;  . Supraventricular tachycardia ablation N/A 01/04/2013    Procedure: SUPRAVENTRICULAR TACHYCARDIA ABLATION;  Surgeon: Evans Lance, MD;  Location: [facility] CATH LAB;  Service: Cardiovascular;  Laterality: N/A;   Family History  Problem Relation Age of Onset  . Cancer Brother   . Cancer Paternal Aunt     x 2 unkown type  . Heart disease Maternal Grandmother   . Heart Problems Mother     apid heartbeat  . Colon cancer Neg Hx     Social History   Social History  . Marital Status: Legally Separated    Spouse Name: N/A  . Number of  Children: 4  . Years of Education: N/A   Occupational History  . order picker [employer]   Social History Main Topics  . Smoking status: Former Smoker -- 0.33 packs/day for 3 years    Types: Cigarettes    Quit date: 12/27/1984  . Smokeless tobacco: Never Used  . Alcohol Use: Yes     Comment: 01/04/2013 "2-3 beers q other weekend"  . Drug Use: No  . Sexual Activity: Not Currently    Birth Control/ Protection: None   Other Topics Concern  . Not on file   Social History Narrative   Health Maintenance  Topic Date Due  . HIV Screening  02/26/1980  . TETANUS/TDAP  02/26/1984  . INFLUENZA VACCINE  02/25/2015  . MAMMOGRAM  02/26/2015  . PAP SMEAR  11/22/2016  . COLONOSCOPY  12/13/2023       Review of Systems Pertinent items are noted in HPI.   Objective:      GENERAL: Well-developed, well-nourished female in no acute distress.  HEENT: Normocephalic, atraumatic. Sclerae anicteric.  NECK: Supple. Normal thyroid.  LUNGS: Clear to auscultation bilaterally.  HEART: Regular rate and rhythm. BREASTS: Symmetric in size. No palpable masses or lymphadenopathy, skin changes, or nipple drainage. ABDOMEN: Soft, nontender, nondistended. No organomegaly. PELVIC: Normal external female genitalia. Vagina is pink and rugated.   EXTREMITIES: No cyanosis, clubbing, or edema, 2+ distal pulses.    Assessment:    Healthy female exam.  Plan:    Patient advised to perform monthly breast and vulva exam Screening mammogram ordered RTC in 1 year See After Visit Summary for Counseling Recommendations

## 2015-07-30 ENCOUNTER — Ambulatory Visit: Payer: Managed Care, Other (non HMO)

## 2016-03-20 ENCOUNTER — Ambulatory Visit
Admission: RE | Admit: 2016-03-20 | Discharge: 2016-03-20 | Disposition: A | Discharge status: Home / Self Care | Payer: Managed Care, Other (non HMO) | Source: Ambulatory Visit | Attending: Obstetrics and Gynecology | Admitting: Obstetrics and Gynecology

## 2016-03-20 DIAGNOSIS — Z1231 Encounter for screening mammogram for malignant neoplasm of breast: Secondary | ICD-10-CM

## 2016-09-29 ENCOUNTER — Encounter: Payer: Self-pay | Admitting: Obstetrics and Gynecology

## 2016-09-29 ENCOUNTER — Ambulatory Visit (INDEPENDENT_AMBULATORY_CARE_PROVIDER_SITE_OTHER): Payer: 59 | Admitting: Obstetrics and Gynecology

## 2016-09-29 VITALS — BP 142/96 | HR 87 | Wt 149.0 lb

## 2016-09-29 DIAGNOSIS — Z0000 Encounter for general adult medical examination without abnormal findings: Secondary | ICD-10-CM | POA: Diagnosis not present

## 2016-09-29 DIAGNOSIS — N952 Postmenopausal atrophic vaginitis: Secondary | ICD-10-CM

## 2016-09-29 DIAGNOSIS — Z01419 Encounter for gynecological examination (general) (routine) without abnormal findings: Secondary | ICD-10-CM

## 2016-09-29 DIAGNOSIS — I10 Essential (primary) hypertension: Secondary | ICD-10-CM

## 2016-09-29 NOTE — Progress Notes (Signed)
Reason for visit annual GYN exam   SUBJECTIVE: HPI [name] is a 51 y.o. [id number] presents for GYN exam. She has been concerned with having occasional cramps and feeling bloated but not at present. She also has pain and burning on intercourse intermittently. She had a normal mammogram August 2017. She is status post hysterectomy for fibroids. Significant medical history is essential hypertension and she states she has been out of her medications. Record review shows that she was on Lopressor 50 mg a day last prescribed 2 years ago. She does not have a PCP. Medical history significant for SVT and palpitations. She is a nonsmoker and uses seatbelt. Does not exercise regularly.  Past Medical History:  Diagnosis Date  . Chest pain   . Chronic anemia   . Difficulty sleeping   . Dysmenorrhea 2011  . Dysrhythmia    irregular heart beats  . Heart palpitations   . History of uterine fibroid   . Menorrhagia   . Shortness of breath    "associated w/high heart rate" (01/04/2013)  . Supraventricular tachycardia [facility])    Past Surgical History:  Procedure Laterality Date  . CARDIAC ELECTROPHYSIOLOGY MAPPING AND ABLATION  ~ 2008; 01/04/2013   "weren't able to do the ablation" (01/04/2013)  . COLONOSCOPY  12/12/2013  . COMBINED AUGMENTATION MAMMAPLASTY AND ABDOMINOPLASTY Bilateral 2002  . ELECTROPHYSIOLOGY STUDY N/A 01/04/2013   Procedure: ELECTROPHYSIOLOGY STUDY;  Surgeon: Evans Lance, MD;  Location: [facility] CATH LAB;  Service: Cardiovascular;  Laterality: N/A;  . SUPRAVENTRICULAR TACHYCARDIA ABLATION N/A 01/04/2013   Procedure: SUPRAVENTRICULAR TACHYCARDIA ABLATION;  Surgeon: Evans Lance, MD;  Location: [facility] CATH LAB;  Service: Cardiovascular;  Laterality: N/A;  . TUBAL LIGATION    . UTERINE FIBROID SURGERY  ?09/2012  . VAGINAL HYSTERECTOMY Bilateral 03/27/2014   Procedure: HYSTERECTOMY VAGINAL WITH BILATERAL SALPINGECTOMY;  Surgeon: Lavonia Drafts, MD;  Location: [place] [facility];  Service:  Gynecology;  Laterality: Bilateral;   Family History  Problem Relation Age of Onset  . Cancer Brother   . Cancer Paternal Aunt     x 2 unkown type  . Heart disease Maternal Grandmother   . Heart Problems Mother     apid heartbeat  . Colon cancer Neg Hx       OBJECTIVE: Vitals:   09/29/16 1009  BP: (!) 142/96  Pulse: 87   Physical Exam  Constitutional: She is oriented to person, place, and time and well-developed, well-nourished, and in no distress. No distress.  HENT:  Head: Normocephalic.  Eyes: Pupils are equal, round, and reactive to light.  Neck: Normal range of motion.  Cardiovascular: Normal rate, regular rhythm and normal heart sounds.   Pulmonary/Chest: Effort normal and breath sounds normal.  Abdominal: Soft. There is no tenderness.  Genitourinary: Right adnexa normal and left adnexa normal. No vaginal discharge found.  Genitourinary Comments: Pelvic exam: NEFG; vagina pale and fairly smooth, scant thin discharge. No adnexal tenderness or masses noted.  Musculoskeletal: Normal range of motion.  Neurological: She is alert and oriented to person, place, and time.  Skin: Skin is warm and dry.  Psychiatric: Memory, affect and judgment normal.  Nursing note and vitals reviewed.   1. Hypertension, essential   2. Encounter for annual routine gynecological examination   3. Vaginal atrophy    Due to her cardiac diagnosis complicating hypertension and BP elevation not severe, we will refer to [facility] for follow-up and medication as indicated. Lopressor not refilled.   Discussed diet and exercise and  AVS on preventive health reviewed  Allergies as of 09/29/2016   No Known Allergies     Medication List       Accurate as of 09/29/16 11:59 PM. Always use your most recent med list.          acetaminophen 325 MG tablet Commonly known as:  TYLENOL Take 650 mg by mouth every 6 (six) hours as needed. Reported on 07/10/2015   ALEVE PO Take by mouth.  Reported on 07/10/2015   cyclobenzaprine 5 MG tablet Commonly known as:  FLEXERIL Take 1 tablet (5 mg total) by mouth 3 (three) times daily as needed for muscle spasms.   diclofenac 50 MG tablet Commonly known as:  CATAFLAM Take 1 tablet (50 mg total) by mouth 3 (three) times daily. For back pain   ibuprofen 800 MG tablet Commonly known as:  ADVIL,MOTRIN Take 1 tablet (800 mg total) by mouth 3 (three) times daily.   metoprolol 50 MG tablet Commonly known as:  LOPRESSOR Take 50 mg by mouth daily as needed (for heart palpations). Reported on 07/10/2015   oxyCODONE-acetaminophen 5-325 MG tablet Commonly known as:  PERCOCET/ROXICET Take 1-2 tablets by mouth every 4 (four) hours as needed for severe pain (moderate to severe pain (when tolerating fluids)).   traMADol 50 MG tablet Commonly known as:  ULTRAM Take 1 tablet (50 mg total) by mouth every 6 (six) hours as needed for moderate pain or severe pain.

## 2016-09-29 NOTE — Patient Instructions (Signed)

## 2016-09-30 DIAGNOSIS — Z01419 Encounter for gynecological examination (general) (routine) without abnormal findings: Secondary | ICD-10-CM | POA: Insufficient documentation

## 2017-08-02 ENCOUNTER — Encounter (HOSPITAL_COMMUNITY): Payer: Self-pay | Admitting: Emergency Medicine

## 2017-08-02 ENCOUNTER — Emergency Department (HOSPITAL_COMMUNITY): Payer: 59

## 2017-08-02 ENCOUNTER — Emergency Department (HOSPITAL_COMMUNITY)
Admission: EM | Admit: 2017-08-02 | Discharge: 2017-08-02 | Disposition: A | Discharge status: Home / Self Care | Payer: 59 | Attending: Emergency Medicine | Admitting: Emergency Medicine

## 2017-08-02 ENCOUNTER — Other Ambulatory Visit: Payer: Self-pay

## 2017-08-02 DIAGNOSIS — R1032 Left lower quadrant pain: Secondary | ICD-10-CM | POA: Diagnosis present

## 2017-08-02 DIAGNOSIS — Z87891 Personal history of nicotine dependence: Secondary | ICD-10-CM | POA: Insufficient documentation

## 2017-08-02 DIAGNOSIS — R109 Unspecified abdominal pain: Secondary | ICD-10-CM

## 2017-08-02 LAB — COMPREHENSIVE METABOLIC PANEL
ALT: 26 U/L (ref 14–54)
AST: 28 U/L (ref 15–41)
Albumin: 3.6 g/dL (ref 3.5–5.0)
Alkaline Phosphatase: 73 U/L (ref 38–126)
Anion gap: 5 (ref 5–15)
BUN: 8 mg/dL (ref 6–20)
CO2: 25 mmol/L (ref 22–32)
Calcium: 9 mg/dL (ref 8.9–10.3)
Chloride: 107 mmol/L (ref 101–111)
Creatinine, Ser: 0.58 mg/dL (ref 0.44–1.00)
Glucose, Bld: 94 mg/dL (ref 65–99)
POTASSIUM: 4.2 mmol/L (ref 3.5–5.1)
Sodium: 137 mmol/L (ref 135–145)
Total Bilirubin: 0.6 mg/dL (ref 0.3–1.2)
Total Protein: 6.9 g/dL (ref 6.5–8.1)

## 2017-08-02 LAB — CBC WITH DIFFERENTIAL/PLATELET
BASOS ABS: 0.1 10*3/uL (ref 0.0–0.1)
Basophils Relative: 2 %
EOS PCT: 2 %
Eosinophils Absolute: 0.1 10*3/uL (ref 0.0–0.7)
HEMATOCRIT: 38.5 % (ref 36.0–46.0)
Hemoglobin: 12.9 g/dL (ref 12.0–15.0)
Lymphocytes Relative: 52 %
Lymphs Abs: 2.6 10*3/uL (ref 0.7–4.0)
MCH: 24.2 pg — ABNORMAL LOW (ref 26.0–34.0)
MCHC: 33.5 g/dL (ref 30.0–36.0)
MCV: 72.1 fL — ABNORMAL LOW (ref 78.0–100.0)
MONO ABS: 0.4 10*3/uL (ref 0.1–1.0)
MONOS PCT: 8 %
NEUTROS PCT: 36 %
Neutro Abs: 1.8 10*3/uL (ref 1.7–7.7)
Platelets: 288 10*3/uL (ref 150–400)
RBC: 5.34 MIL/uL — AB (ref 3.87–5.11)
RDW: 14.9 % (ref 11.5–15.5)
WBC: 5 10*3/uL (ref 4.0–10.5)

## 2017-08-02 LAB — URINALYSIS, ROUTINE W REFLEX MICROSCOPIC
Bacteria, UA: NONE SEEN
Bilirubin Urine: NEGATIVE
GLUCOSE, UA: NEGATIVE mg/dL
Ketones, ur: NEGATIVE mg/dL
Leukocytes, UA: NEGATIVE
Nitrite: NEGATIVE
Protein, ur: NEGATIVE mg/dL
Specific Gravity, Urine: 1.017 (ref 1.005–1.030)
pH: 5 (ref 5.0–8.0)

## 2017-08-02 LAB — LIPASE, BLOOD: LIPASE: 32 U/L (ref 11–51)

## 2017-08-02 MED ORDER — DICYCLOMINE HCL 20 MG PO TABS: 20.0000 mg | ORAL_TABLET | Freq: Two times a day (BID) | ORAL | 0 refills | Status: DC

## 2017-08-02 MED ORDER — KETOROLAC TROMETHAMINE 30 MG/ML IJ SOLN
30.0000 mg | Freq: Once | INTRAMUSCULAR | Status: AC
  Administered 2017-08-02: 30 mg via INTRAVENOUS
  Filled 2017-08-02: qty 1

## 2017-08-02 MED ORDER — PANTOPRAZOLE SODIUM 40 MG PO TBEC: 40.0000 mg | DELAYED_RELEASE_TABLET | Freq: Every day | ORAL | 0 refills | Status: DC

## 2017-08-02 MED ORDER — SUCRALFATE 1 GM/10ML PO SUSP: 1.0000 g | Freq: Three times a day (TID) | ORAL | 0 refills | Status: DC

## 2017-08-02 NOTE — ED Triage Notes (Signed)
Pt reports left flank pain for the last 2 days and sometimes radiates into left leg. Pt denies any n/v/d. No urinary symptoms.

## 2017-08-02 NOTE — ED Provider Notes (Signed)
Emergency Department Provider Note   I have reviewed the triage vital signs and the nursing notes.   HISTORY  Chief Complaint Flank Pain   HPI [name] is a 52 y.o. female with PMH of heart palpitations, SVT, and anemia presents to the emergency department for evaluation of left flank pain radiating around to the left groin and leg for the past week.  Symptoms worsening significantly this morning.  No dysuria, hesitancy, urgency.  Patient denies any nausea, vomiting, diarrhea.  No fevers or chills.  No blood in the stool.  No numbness or tingling in the left leg.  No similar pain in the past.  No known history of kidney stones.  No injury to the flank or abdomen.  She is been trying over-the-counter pain medications with no relief in symptoms.   Past Medical History:  Diagnosis Date  . Chest pain   . Chronic anemia   . Difficulty sleeping   . Dysmenorrhea 2011  . Dysrhythmia    irregular heart beats  . Heart palpitations   . History of uterine fibroid   . Menorrhagia   . Shortness of breath    "associated w/high heart rate" (01/04/2013)  . Supraventricular tachycardia [facility])     Patient Active Problem List   Diagnosis Date Noted  . Encounter for annual routine gynecological examination 09/30/2016  . Post-operative state 03/27/2014  . Fibroids 09/20/2013  . Dysmenorrhea 11/17/2012  . ALCOHOL USE 11/13/2008  . SUPRAVENTRICULAR TACHYCARDIA 11/13/2008  . CHEST PAIN 11/13/2008  . PALPITATIONS 09/23/2006    Past Surgical History:  Procedure Laterality Date  . CARDIAC ELECTROPHYSIOLOGY MAPPING AND ABLATION  ~ 2008; 01/04/2013   "weren't able to do the ablation" (01/04/2013)  . COLONOSCOPY  12/12/2013  . COMBINED AUGMENTATION MAMMAPLASTY AND ABDOMINOPLASTY Bilateral 2002  . ELECTROPHYSIOLOGY STUDY N/A 01/04/2013   Procedure: ELECTROPHYSIOLOGY STUDY;  Surgeon: Evans Lance, MD;  Location: [facility] CATH LAB;  Service: Cardiovascular;  Laterality: N/A;  .  SUPRAVENTRICULAR TACHYCARDIA ABLATION N/A 01/04/2013   Procedure: SUPRAVENTRICULAR TACHYCARDIA ABLATION;  Surgeon: Evans Lance, MD;  Location: [facility] CATH LAB;  Service: Cardiovascular;  Laterality: N/A;  . TUBAL LIGATION    . UTERINE FIBROID SURGERY  ?09/2012  . VAGINAL HYSTERECTOMY Bilateral 03/27/2014   Procedure: HYSTERECTOMY VAGINAL WITH BILATERAL SALPINGECTOMY;  Surgeon: Lavonia Drafts, MD;  Location: [place] [facility];  Service: Gynecology;  Laterality: Bilateral;    Current Outpatient Rx  . Order #: [id number] Class: Historical Med  . Order #: [id number] Class: Print  . Order #: [id number] Class: Print  . Order #: [id number] Class: Normal  . Order #: [id number] Class: Print  . Order #: [id number] Class: Print  . Order #: [id number] Class: Print  . Order #: [id number] Class: Print    Allergies Patient has no known allergies.  Family History  Problem Relation Age of Onset  . Cancer Brother   . Cancer Paternal Aunt        x 2 unkown type  . Heart disease Maternal Grandmother   . Heart Problems Mother        apid heartbeat  . Colon cancer Neg Hx     Social History Social History   Tobacco Use  . Smoking status: Former Smoker    Packs/day: 0.33    Years: 3.00    Pack years: 0.99    Types: Cigarettes    Last attempt to quit: 12/27/1984    Years since quitting: 32.6  . Smokeless tobacco: Never Used  Substance Use Topics  . Alcohol use: Yes  Comment: 01/04/2013 "2-3 beers q other weekend"  . Drug use: No    Review of Systems  Constitutional: No fever/chills Eyes: No visual changes. ENT: No sore throat. Cardiovascular: Denies chest pain. Respiratory: Denies shortness of breath. Gastrointestinal: No abdominal pain.  No nausea, no vomiting.  No diarrhea.  No constipation. Genitourinary: Negative for dysuria. Positive left flank pain.  Musculoskeletal: Negative for back pain. Skin: Negative for rash. Neurological: Negative for headaches, focal weakness or numbness.  10-point ROS  otherwise negative.  ____________________________________________   PHYSICAL EXAM:  VITAL SIGNS: ED Triage Vitals  Enc Vitals Group     BP 08/02/17 0803 (!) 140/97     Pulse Rate 08/02/17 0803 71     Resp 08/02/17 0803 16     Temp 08/02/17 0803 98.2 F (36.8 C)     Temp Source 08/02/17 0803 Oral     SpO2 08/02/17 0803 97 %     Weight 08/02/17 0803 142 lb (64.4 kg)     Height 08/02/17 0803 4\' 11"  (1.499 m)     Pain Score 08/02/17 0815 8   Constitutional: Alert and oriented. Well appearing and in no acute distress. Eyes: Conjunctivae are normal.  Head: Atraumatic. Nose: No congestion/rhinnorhea. Mouth/Throat: Mucous membranes are moist.  Oropharynx non-erythematous. Neck: No stridor.  Cardiovascular: Normal rate, regular rhythm. Good peripheral circulation. Grossly normal heart sounds.   Respiratory: Normal respiratory effort.  No retractions. Lungs CTAB. Gastrointestinal: Soft and nontender. No rebound or guarding. No distention. Mild left CVA tenderness to percussion.  Musculoskeletal: No lower extremity tenderness nor edema. No gross deformities of extremities. Neurologic:  Normal speech and language. No gross focal neurologic deficits are appreciated.  Skin:  Skin is warm, dry and intact. No rash noted.  ____________________________________________   LABS (all labs ordered are listed, but only abnormal results are displayed)  Labs Reviewed  URINALYSIS, ROUTINE W REFLEX MICROSCOPIC - Abnormal; Notable for the following components:      Result Value   Hgb urine dipstick SMALL (*)    Squamous Epithelial / LPF 0-5 (*)    All other components within normal limits  CBC WITH DIFFERENTIAL/PLATELET - Abnormal; Notable for the following components:   RBC 5.34 (*)    MCV 72.1 (*)    MCH 24.2 (*)    All other components within normal limits  COMPREHENSIVE METABOLIC PANEL  LIPASE, BLOOD   ____________________________________________  RADIOLOGY  Ct Renal Stone  Study  Result Date: 08/02/2017 CLINICAL DATA:  Hematuria. Left-sided flank pain for the past 2 weeks. EXAM: CT ABDOMEN AND PELVIS WITHOUT CONTRAST TECHNIQUE: Multidetector CT imaging of the abdomen and pelvis was performed following the standard protocol without IV contrast. COMPARISON:  CT abdomen pelvis dated February 10, 2010. FINDINGS: Lower chest: No acute abnormality. Hepatobiliary: No focal liver abnormality is seen. No gallstones, gallbladder wall thickening, or biliary dilatation. Pancreas: Unremarkable. No pancreatic ductal dilatation or surrounding inflammatory changes. Spleen: Normal in size without focal abnormality. Adrenals/Urinary Tract: Adrenal glands are unremarkable. Kidneys are normal, without renal calculi, focal lesion, or hydronephrosis. Bladder is unremarkable. Stomach/Bowel: Stomach is within normal limits. Appendix appears normal. No evidence of bowel wall thickening, distention, or inflammatory changes. Vascular/Lymphatic: No significant vascular findings are present. No enlarged abdominal or pelvic lymph nodes. Reproductive: Status post hysterectomy. No adnexal masses. Other: No free fluid or pneumoperitoneum. Musculoskeletal: No acute or significant osseous findings. IMPRESSION: 1.  No acute intra-abdominal process. Electronically Signed   By: Titus Dubin M.D.   On: 08/02/2017 10:27  ____________________________________________   PROCEDURES  Procedure(s) performed:   Procedures  None ____________________________________________   INITIAL IMPRESSION / ASSESSMENT AND PLAN / ED COURSE  Pertinent labs & imaging results that were available during my care of the patient were reviewed by me and considered in my medical decision making (see chart for details).  Patient presents to the emergency department for evaluation of left flank pain for the last 2 days.  Some radiation to the groin.  No history of kidney stones.  No focal tenderness on examination of the abdomen.   She does have some mild CVA tenderness on the left. Plan for CT renal to evaluate and r/o nephrolithiasis.   12:01 PM CT imaging of the abdomen negative.  Labs are largely unremarkable.  Patient admits to me now that she has had intermittent pain similar to this.  She has not been evaluated by a primary care physician or gastroenterologist.  Will provide contact information for both of these services but advised that often insurance wants a referral from the primary care physician.  I did start the patient on Protonix, Carafate, Bentyl as needed for pain and return to the emergency department with any new or worsening symptoms.  At this time, I do not feel there is any life-threatening condition present. I have reviewed and discussed all results (EKG, imaging, lab, urine as appropriate), exam findings with patient. I have reviewed nursing notes and appropriate previous records.  I feel the patient is safe to be discharged home without further emergent workup. Discussed usual and customary return precautions. Patient and family (if present) verbalize understanding and are comfortable with this plan.  Patient will follow-up with their primary care provider. If they do not have a primary care provider, information for follow-up has been provided to them. All questions have been answered.  ____________________________________________  FINAL CLINICAL IMPRESSION(S) / ED DIAGNOSES  Final diagnoses:  Flank pain     MEDICATIONS GIVEN DURING THIS VISIT:  Medications  ketorolac (TORADOL) 30 MG/ML injection 30 mg (30 mg Intravenous Given 08/02/17 1041)     NEW OUTPATIENT MEDICATIONS STARTED DURING THIS VISIT:  Carafate, Bentyl, and Protonix   Note:  This document was prepared using Dragon voice recognition software and may include unintentional dictation errors.  Nanda Quinton, MD Emergency Medicine    Long, [facility], MD 08/02/17 1213

## 2017-08-02 NOTE — ED Notes (Signed)
Patient transported to ct

## 2017-08-02 NOTE — Discharge Instructions (Signed)

## 2017-08-24 ENCOUNTER — Encounter: Payer: 59 | Admitting: Podiatry

## 2017-08-24 ENCOUNTER — Ambulatory Visit: Payer: 59

## 2017-08-24 NOTE — Progress Notes (Signed)
g right

## 2017-08-24 NOTE — Progress Notes (Signed)
This encounter was created in error - please disregard.

## 2017-09-07 ENCOUNTER — Encounter: Payer: 59 | Admitting: Podiatry

## 2017-09-07 ENCOUNTER — Ambulatory Visit: Payer: 59

## 2017-09-21 ENCOUNTER — Ambulatory Visit (INDEPENDENT_AMBULATORY_CARE_PROVIDER_SITE_OTHER): Payer: 59 | Admitting: Podiatry

## 2017-09-21 ENCOUNTER — Ambulatory Visit (INDEPENDENT_AMBULATORY_CARE_PROVIDER_SITE_OTHER): Payer: 59

## 2017-09-21 ENCOUNTER — Encounter: Payer: Self-pay | Admitting: Podiatry

## 2017-09-21 DIAGNOSIS — M722 Plantar fascial fibromatosis: Secondary | ICD-10-CM | POA: Diagnosis not present

## 2017-09-21 DIAGNOSIS — M7750 Other enthesopathy of unspecified foot: Secondary | ICD-10-CM | POA: Diagnosis not present

## 2017-09-21 MED ORDER — METHYLPREDNISOLONE 4 MG PO TBPK: ORAL_TABLET | ORAL | 0 refills | Status: DC

## 2017-09-21 MED ORDER — MELOXICAM 15 MG PO TABS: 15.0000 mg | ORAL_TABLET | Freq: Every day | ORAL | 3 refills | Status: DC

## 2017-09-21 NOTE — Progress Notes (Signed)
She presents today with a chief complaint of pain to the left heel times 1 year.  She states that if the left heel is a 10 out of 10 pain in the right heel is an 8 out of 10.  She is tried Aleve and Biofreeze to no avail.  States that it hurts worse in the morning and after she has been off of her feet for a period of time and then gets back up to walk.  She states that she was stepping a chemical at work but because her foot started hurting in the first place.  Objective: Vital signs are stable she is alert and oriented x3 I reviewed her past medical history medications allergies surgery social history and review of systems.  Pulses are strongly palpable.  Neurologic sensorium is intact.  Deep tendon reflexes are intact.  Muscle strength was 5/5 dorsiflexors plantar flexors inverters everters onto the musculature is intact.  Orthopedic evaluation demonstrates all joints distal to the ankle full range of motion without crepitation.  She has pain on palpation medial calcaneal tubercles bilateral heel left greater than right.  Cutaneous evaluation demonstrates supple well-hydrated cutis no erythema edema cellulitis drainage or odor.  Radiographs taken today demonstrates an osseously mature individual no acute findings.  Soft tissue increase in density of plantar fascial cranial insertion site of the left heel is noted.  Assessment: Plantar fasciitis left greater than right.  Plan: Discussed etiology pathology conservative versus surgical therapies.  At this point after sterile Betadine skin prep and verbal consent I injected the bilateral heels with 20 mg of Kenalog 5 mg Marcaine medial aspect of the bilateral heels respectively.  Tolerated procedure well without complications.  Start her on a Medrol Dosepak to be followed by meloxicam.  Dispense 2 plantar fascial braces and night splint.  Discussed appropriate shoe gear stretching exercises ice therapy as your modifications will follow up with her in 1  month.

## 2017-09-21 NOTE — Patient Instructions (Signed)

## 2017-10-04 NOTE — Progress Notes (Signed)
This encounter was created in error - please disregard.

## 2017-10-19 ENCOUNTER — Encounter: Payer: Self-pay | Admitting: Podiatry

## 2017-10-19 ENCOUNTER — Ambulatory Visit (INDEPENDENT_AMBULATORY_CARE_PROVIDER_SITE_OTHER): Payer: 59 | Admitting: Podiatry

## 2017-10-19 DIAGNOSIS — M722 Plantar fascial fibromatosis: Secondary | ICD-10-CM

## 2017-10-20 NOTE — Progress Notes (Signed)
She presents today for follow-up of her bilateral plantar fasciitis.  She states that she feels approximately 90% improved.  She states that she only takes her medication when she needs it.  She has not purchase new shoes as of yet.  Objective: Vital signs are stable she is alert and oriented x3 pulses are palpable neurologic sensorium is intact deep tendon reflexes are intact she has pain on palpation medial calcaneal tubercles but much improved from previous evaluation.  Assessment: Resolving plantar fasciitis.  Plan: Encouraged her to continue all conservative therapies i.e. night splint plantar fascial brace shoe gear changes and modifications and I encouraged her to take her meloxicam every day not intermittently.  She will also make an appointment to follow-up with Liliane Channel for a pair of orthotics.

## 2018-02-04 ENCOUNTER — Telehealth: Payer: Self-pay | Admitting: Student

## 2018-02-04 NOTE — Telephone Encounter (Signed)
Patient need a order for a mammogram

## 2018-02-07 ENCOUNTER — Telehealth: Payer: Self-pay

## 2018-02-07 DIAGNOSIS — Z0000 Encounter for general adult medical examination without abnormal findings: Secondary | ICD-10-CM

## 2018-02-07 NOTE — Telephone Encounter (Signed)
Patient would like a order placed for a mammogram.

## 2018-03-15 ENCOUNTER — Encounter: Payer: Self-pay | Admitting: Student

## 2018-03-15 ENCOUNTER — Ambulatory Visit (INDEPENDENT_AMBULATORY_CARE_PROVIDER_SITE_OTHER): Payer: 59 | Admitting: Student

## 2018-03-15 VITALS — BP 132/87 | HR 95 | Wt 145.3 lb

## 2018-03-15 DIAGNOSIS — Z113 Encounter for screening for infections with a predominantly sexual mode of transmission: Secondary | ICD-10-CM | POA: Diagnosis not present

## 2018-03-15 DIAGNOSIS — Z202 Contact with and (suspected) exposure to infections with a predominantly sexual mode of transmission: Secondary | ICD-10-CM

## 2018-03-15 DIAGNOSIS — Z01419 Encounter for gynecological examination (general) (routine) without abnormal findings: Secondary | ICD-10-CM

## 2018-03-15 NOTE — Progress Notes (Signed)
Patient ID: [name], female   DOB: [birth year], 52 y.o.   MRN: [id number]   History:  [name] is a 52 y.o. [id number] who presents to clinic today for breast exam and STI testing. She says that her ex-husband told her in June that he had given her herpes, but that he may "have just said that to hurt me". She denies any blister, pain, itching or burning in her vagina since then.  She did not observe any blisters on her partner when they were together.  She still wants to be checked for GC chlamydia. She denies any pain, burning with urination, vaginal discharge or other ob-gyn complaints.   The following portions of the patient's history were reviewed and updated as appropriate: allergies, current medications, family history, past medical history, social history, past surgical history and problem list.  Review of Systems:  Review of Systems  Constitutional: Negative.   HENT: Negative.   Eyes: Negative.   Respiratory: Negative.   Cardiovascular: Negative.   Gastrointestinal: Negative.   Genitourinary: Negative.   Musculoskeletal: Negative.   Skin: Negative.   Neurological: Negative.   Psychiatric/Behavioral: Negative.       Objective:  Physical Exam BP 132/87   Pulse 95   Wt 145 lb 4.8 oz (65.9 kg)   LMP 03/15/2014   BMI 29.35 kg/m  Physical Exam  Constitutional: She appears well-developed.  HENT:  Head: Normocephalic.  Neck: Normal range of motion.  Pulmonary/Chest: Effort normal.  Abdominal: Soft.  Musculoskeletal: Normal range of motion.  Neurological: She is alert.  Skin: Skin is warm.  Psychiatric: She has a normal mood and affect.    Breast exam benign-breast implants palpated making exam difficult but no lumps, masses or dimpling.  Labs and Imaging No results found for this or any previous visit (from the past 24 hour(s)).  No results found.   Assessment & Plan:  1. STD exposure -Explained to patient that,without an active lesion, we  cannot test her for herpes. If she develops any itching, pain, burning, blistering she should come to walk-in gyn clinic or visit Urgent Care so we can examine her and collect a sample.  -If she does develop HVS-2, we have medications she can take. Patient is reassured by this news and by the fact that she was possibly exposed two months ago and has not had an outbreak.  - Cervicovaginal ancillary only  2. Well woman exam - Ambulatory referral to [facility] -Patient has mammogram scheduled for Sept 5.    [name], [place] 03/15/2018 7:00 PM

## 2018-03-15 NOTE — Patient Instructions (Addendum)
You will receive a call from "[phone]" number if any of your results come back positive.    Genital Herpes Genital herpes is a common sexually transmitted infection (STI) that is caused by a virus. The virus spreads from person to person through sexual contact. Infection can cause itching, blisters, and sores around the genitals or rectum. Symptoms may last several days and then go away This is called an outbreak. However, the virus remains in your body, so you may have more outbreaks in the future. The time between outbreaks varies and can be months or years. Genital herpes affects men and women. It is particularly concerning for pregnant women because the virus can be passed to the baby during delivery and can cause serious problems. Genital herpes is also a concern for people who have a weak disease-fighting (immune) system. What are the causes? This condition is caused by the herpes simplex virus (HSV) type 1 or type 2. The virus may spread through:  Sexual contact with an infected person, including vaginal, anal, and oral sex.  Contact with fluid from a herpes sore.  The skin. This means that you can get herpes from an infected partner even if he or she does not have a visible sore or does not know that he or she is infected.  What increases the risk? You are more likely to develop this condition if:  You have sex with many partners.  You do not use latex condoms during sex.  What are the signs or symptoms? Most people do not have symptoms (asymptomatic) or have mild symptoms that may be mistaken for other skin problems. Symptoms may include:  Small red bumps near the genitals, rectum, or mouth. These bumps turn into blisters and then turn into sores.  Flu-like symptoms, including: ? Fever. ? Body aches. ? Swollen lymph nodes. ? Headache.  Painful urination.  Pain and itching in the genital area or rectal area.  Vaginal discharge.  Tingling or shooting pain in the legs and  buttocks.  Generally, symptoms are more severe and last longer during the first (primary) outbreak. Flu-like symptoms are also more common during the primary outbreak. How is this diagnosed? Genital herpes may be diagnosed based on:  A physical exam.  Your medical history.  Blood tests.  A test of a fluid sample (culture) from an open sore.  How is this treated? There is no cure for this condition, but treatment with antiviral medicines that are taken by mouth (orally) can do the following:  Speed up healing and relieve symptoms.  Help to reduce the spread of the virus to sexual partners.  Limit the chance of future outbreaks, or make future outbreaks shorter.  Lessen symptoms of future outbreaks.  Your health care provider may also recommend pain relief medicines, such as aspirin or ibuprofen. Follow these instructions at home: Sexual activity  Do not have sexual contact during active outbreaks.  Practice safe sex. Latex condoms and female condoms may help prevent the spread of the herpes virus. General instructions  Keep the affected areas dry and clean.  Take over-the-counter and prescription medicines only as told by your health care provider.  Avoid rubbing or touching blisters and sores. If you do touch blisters or sores: ? Wash your hands thoroughly with soap and water. ? Do not touch your eyes afterward.  To help relieve pain or itching, you may take the following actions as directed by your health care provider: ? Apply a cold, wet cloth (cold compress)  to affected areas 4-6 times a day. ? Apply a substance that protects your skin and reduces bleeding (astringent). ? Apply a gel that helps relieve pain around sores (lidocaine gel). ? Take a warm, shallow bath that cleans the genital area (sitz bath).  Keep all follow-up visits as told by your health care provider. This is important. How is this prevented?  Use condoms. Although anyone can get genital herpes  during sexual contact, even with the use of a condom, a condom can provide some protection.  Avoid having multiple sexual partners.  Talk with your sexual partner about any symptoms either of you may have. Also, talk with your partner about any history of STIs.  Get tested for STIs before you have sex. Ask your partner to do the same.  Do not have sexual contact if you have symptoms of genital herpes. Contact a health care provider if:  Your symptoms are not improving with medicine.  Your symptoms return.  You have new symptoms.  You have a fever.  You have abdominal pain.  You have redness, swelling, or pain in your eye.  You notice new sores on other parts of your body.  You are a woman and experience bleeding between menstrual periods.  You have had herpes and you become pregnant or plan to become pregnant. Summary  Genital herpes is a common sexually transmitted infection (STI) that is caused by the herpes simplex virus (HSV) type 1 or type 2.  These viruses are most often spread through sexual contact with an infected person.  You are more likely to develop this condition if you have sex with many partners or you have unprotected sex.  Most people do not have symptoms (asymptomatic) or have mild symptoms that may be mistaken for other skin problems. Symptoms occur as outbreaks that may happen months or years apart.  There is no cure for this condition, but treatment with oral antiviral medicines can reduce symptoms, reduce the chance of spreading the virus to a partner, prevent future outbreaks, or shorten future outbreaks. This information is not intended to replace advice given to you by your health care provider. Make sure you discuss any questions you have with your health care provider. Document Released: 07/10/2000 Document Revised: 06/12/2016 Document Reviewed: 06/12/2016 Elsevier Interactive Patient Education  [employer].

## 2018-03-16 LAB — CERVICOVAGINAL ANCILLARY ONLY
CHLAMYDIA, DNA PROBE: NEGATIVE
NEISSERIA GONORRHEA: NEGATIVE
TRICH (WINDOWPATH): NEGATIVE

## 2018-03-30 ENCOUNTER — Ambulatory Visit: Payer: 59

## 2018-03-31 ENCOUNTER — Ambulatory Visit
Admission: RE | Admit: 2018-03-31 | Discharge: 2018-03-31 | Disposition: A | Discharge status: Home / Self Care | Payer: 59 | Source: Ambulatory Visit | Attending: Obstetrics & Gynecology | Admitting: Obstetrics & Gynecology

## 2018-03-31 DIAGNOSIS — Z0000 Encounter for general adult medical examination without abnormal findings: Secondary | ICD-10-CM

## 2018-04-04 ENCOUNTER — Other Ambulatory Visit (HOSPITAL_COMMUNITY): Payer: Self-pay | Admitting: Obstetrics & Gynecology

## 2018-04-04 DIAGNOSIS — R928 Other abnormal and inconclusive findings on diagnostic imaging of breast: Secondary | ICD-10-CM

## 2018-04-13 ENCOUNTER — Ambulatory Visit
Admission: RE | Admit: 2018-04-13 | Discharge: 2018-04-13 | Disposition: A | Discharge status: Home / Self Care | Payer: 59 | Source: Ambulatory Visit | Attending: Obstetrics & Gynecology | Admitting: Obstetrics & Gynecology

## 2018-04-13 ENCOUNTER — Other Ambulatory Visit (HOSPITAL_COMMUNITY): Payer: Self-pay | Admitting: Obstetrics & Gynecology

## 2018-04-13 DIAGNOSIS — R928 Other abnormal and inconclusive findings on diagnostic imaging of breast: Secondary | ICD-10-CM

## 2018-04-13 DIAGNOSIS — N6002 Solitary cyst of left breast: Secondary | ICD-10-CM

## 2018-08-21 ENCOUNTER — Encounter (HOSPITAL_COMMUNITY): Payer: Self-pay | Admitting: *Deleted

## 2018-08-21 ENCOUNTER — Ambulatory Visit (HOSPITAL_COMMUNITY)
Admission: EM | Admit: 2018-08-21 | Discharge: 2018-08-21 | Disposition: A | Discharge status: Home / Self Care | Payer: 59 | Attending: Urgent Care | Admitting: Urgent Care

## 2018-08-21 DIAGNOSIS — R21 Rash and other nonspecific skin eruption: Secondary | ICD-10-CM | POA: Insufficient documentation

## 2018-08-21 DIAGNOSIS — B009 Herpesviral infection, unspecified: Secondary | ICD-10-CM | POA: Diagnosis present

## 2018-08-21 DIAGNOSIS — R030 Elevated blood-pressure reading, without diagnosis of hypertension: Secondary | ICD-10-CM

## 2018-08-21 DIAGNOSIS — I10 Essential (primary) hypertension: Secondary | ICD-10-CM

## 2018-08-21 MED ORDER — ACYCLOVIR 400 MG PO TABS: 400.0000 mg | ORAL_TABLET | Freq: Three times a day (TID) | ORAL | 5 refills | Status: DC

## 2018-08-21 NOTE — ED Triage Notes (Signed)
C/O painful, pruritic genital lesion x 2 days.

## 2018-08-21 NOTE — ED Provider Notes (Signed)
MRN: [id number] DOB: 1966/06/24  Subjective:   [name] is a 53 y.o. female presenting for 2 day history of severe sharp/aching constant genital rash, vaginal itching. She is sexually active, did not use condoms for protection. Her last sex partner did say he had herpes.   No current facility-administered medications for this encounter.   Current Outpatient Medications:  .  meloxicam (MOBIC) 15 MG tablet, Take 1 tablet (15 mg total) by mouth daily. (Patient not taking: Reported on 03/15/2018), Disp: 30 tablet, Rfl: 3 .  Naproxen Sodium (ALEVE PO), Take 440 mg by mouth as needed (pain). Reported on 07/10/2015, Disp: , Rfl:    No Known Allergies  Past Medical History:  Diagnosis Date  . Chest pain   . Chronic anemia   . Difficulty sleeping   . Dysmenorrhea 2011  . Dysrhythmia    irregular heart beats  . Heart palpitations   . History of uterine fibroid   . Menorrhagia   . Shortness of breath    "associated w/high heart rate" (01/04/2013)  . Supraventricular tachycardia [facility])      Past Surgical History:  Procedure Laterality Date  . ABDOMINAL HYSTERECTOMY    . AUGMENTATION MAMMAPLASTY Bilateral   . CARDIAC ELECTROPHYSIOLOGY MAPPING AND ABLATION  ~ 2008; 01/04/2013   "weren't able to do the ablation" (01/04/2013)  . COLONOSCOPY  12/12/2013  . COMBINED AUGMENTATION MAMMAPLASTY AND ABDOMINOPLASTY Bilateral 2002  . ELECTROPHYSIOLOGY STUDY N/A 01/04/2013   Procedure: ELECTROPHYSIOLOGY STUDY;  Surgeon: Evans Lance, MD;  Location: [facility] CATH LAB;  Service: Cardiovascular;  Laterality: N/A;  . SUPRAVENTRICULAR TACHYCARDIA ABLATION N/A 01/04/2013   Procedure: SUPRAVENTRICULAR TACHYCARDIA ABLATION;  Surgeon: Evans Lance, MD;  Location: [facility] CATH LAB;  Service: Cardiovascular;  Laterality: N/A;  . TUBAL LIGATION    . UTERINE FIBROID SURGERY  ?09/2012  . VAGINAL HYSTERECTOMY Bilateral 03/27/2014   Procedure: HYSTERECTOMY VAGINAL WITH BILATERAL SALPINGECTOMY;  Surgeon: Lavonia Drafts, MD;  Location: [facility];  Service: Gynecology;  Laterality: Bilateral;   Review of Systems  Constitutional: Negative for chills and fever.  HENT: Negative for ear pain and sore throat.   Eyes: Negative for double vision.  Respiratory: Negative for cough.   Cardiovascular: Negative for chest pain.  Gastrointestinal: Negative for abdominal pain, nausea and vomiting.  Genitourinary: Negative for dysuria, hematuria and urgency.  Musculoskeletal: Negative for back pain and myalgias.  Neurological: Positive for headaches. Negative for dizziness and weakness.  Psychiatric/Behavioral: Negative for depression. The patient is not nervous/anxious.    Objective:   Vitals: BP (!) 167/96   Pulse 66   Temp 98.9 F (37.2 C) (Oral)   LMP 03/15/2014   SpO2 99%   BP Readings from Last 3 Encounters:  08/21/18 (!) 167/96  03/15/18 132/87  08/02/17 111/80   Physical Exam Exam conducted with a chaperone present.  Constitutional:      General: She is not in acute distress.    Appearance: Normal appearance. She is well-developed. She is not ill-appearing, toxic-appearing or diaphoretic.  HENT:     Head: Normocephalic and atraumatic.     Nose: Nose normal.     Mouth/Throat:     Mouth: Mucous membranes are moist.  Eyes:     Extraocular Movements: Extraocular movements intact.     Pupils: Pupils are equal, round, and reactive to light.  Cardiovascular:     Rate and Rhythm: Normal rate and regular rhythm.     Pulses: Normal pulses.     Heart sounds: Normal  heart sounds. No murmur. No friction rub. No gallop.   Pulmonary:     Effort: Pulmonary effort is normal. No respiratory distress.     Breath sounds: Normal breath sounds. No stridor. No wheezing, rhonchi or rales.  Genitourinary:      Comments: Pelvic exam performed with nurse Melissa serving as a female chaperone.  Patient has a cluster of vesicular lesions on erythematous base extending approximately 1 cm over area depicted.   Lesions are very tender to palpation. Skin:    General: Skin is warm and dry.     Findings: No rash.  Neurological:     Mental Status: She is alert and oriented to person, place, and time.  Psychiatric:        Mood and Affect: Mood normal.        Behavior: Behavior normal.        Thought Content: Thought content normal.     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  Will start acyclovir for patient.  Counseled on the nature of genital herpes.  Labs pending.  Patient also has a history of high blood pressure, not currently on medical therapy for this.  Will monitor as I suspect that she may be stressed out about her current diagnosis of genital herpes.  She is to follow-up with her PCP to consider restarting blood pressure medication should it remain elevated.   Jaynee Eagles, [place] 08/21/18 1218

## 2018-08-22 LAB — URINE CYTOLOGY ANCILLARY ONLY
Chlamydia: NEGATIVE
Neisseria Gonorrhea: NEGATIVE
TRICH (WINDOWPATH): NEGATIVE

## 2018-08-23 LAB — HSV CULTURE AND TYPING

## 2018-08-23 LAB — URINE CYTOLOGY ANCILLARY ONLY
BACTERIAL VAGINITIS: NEGATIVE
CANDIDA VAGINITIS: NEGATIVE

## 2018-08-24 ENCOUNTER — Telehealth (HOSPITAL_COMMUNITY): Payer: Self-pay | Admitting: Emergency Medicine

## 2018-08-24 NOTE — Telephone Encounter (Signed)
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 Pt treated with acyclovir. Spoke with patient extensively about symptoms and management. All questions answered.

## 2018-09-08 ENCOUNTER — Encounter (HOSPITAL_COMMUNITY): Payer: Self-pay | Admitting: Emergency Medicine

## 2018-09-08 ENCOUNTER — Ambulatory Visit (HOSPITAL_COMMUNITY)
Admission: EM | Admit: 2018-09-08 | Discharge: 2018-09-08 | Disposition: A | Discharge status: Home / Self Care | Payer: 59 | Attending: Family Medicine | Admitting: Family Medicine

## 2018-09-08 DIAGNOSIS — R197 Diarrhea, unspecified: Secondary | ICD-10-CM

## 2018-09-08 DIAGNOSIS — R112 Nausea with vomiting, unspecified: Secondary | ICD-10-CM | POA: Diagnosis not present

## 2018-09-08 MED ORDER — ONDANSETRON HCL 4 MG PO TABS: 4.0000 mg | ORAL_TABLET | Freq: Four times a day (QID) | ORAL | 0 refills | Status: DC

## 2018-09-08 NOTE — Discharge Instructions (Addendum)
Take the Zofran for nausea and vomiting 20 to 30 minutes later you can try drinking more fluids When you are able to keep down fluids adequately, you may start bland diet The diarrhea usually goes away by itself in a day or 2 If you have persistent diarrhea you may take Imodium per package directions Avoid fatty foods, fried foods, highly spicy foods for a week or 2 Return if you are unable to keep down food or fluids within the next day or 2

## 2018-09-08 NOTE — ED Provider Notes (Signed)
[place]    CSN: [id number] Arrival date & time: 09/08/18  1128     History   Chief Complaint Chief Complaint  Patient presents with  . Abdominal Pain    HPI [name] is a 53 y.o. female.   HPI  Patient is here for nausea vomiting and diarrhea for 2 days.  She has a lot of abdominal noise and cramping.  The diarrhea is not as bad as the vomiting.  She states she is not able to hold out solid food for 2 days.  She is keeping down some fluids.  She has missed time from work and needs a note.  No fever or chills.  No recent travel.  No recent antibiotics.  No known exposure to gastroenteritis or infection.  She did not eat any foods that she thinks may not have agreed with her  Past Medical History:  Diagnosis Date  . Chest pain   . Chronic anemia   . Difficulty sleeping   . Dysmenorrhea 2011  . Dysrhythmia    irregular heart beats  . Heart palpitations   . History of uterine fibroid   . Menorrhagia   . Shortness of breath    "associated w/high heart rate" (01/04/2013)  . Supraventricular tachycardia [facility])     Patient Active Problem List   Diagnosis Date Noted  . Encounter for annual routine gynecological examination 09/30/2016  . Post-operative state 03/27/2014  . Fibroids 09/20/2013  . Dysmenorrhea 11/17/2012  . ALCOHOL USE 11/13/2008  . SUPRAVENTRICULAR TACHYCARDIA 11/13/2008  . CHEST PAIN 11/13/2008  . PALPITATIONS 09/23/2006    Past Surgical History:  Procedure Laterality Date  . ABDOMINAL HYSTERECTOMY    . AUGMENTATION MAMMAPLASTY Bilateral   . CARDIAC ELECTROPHYSIOLOGY MAPPING AND ABLATION  ~ 2008; 01/04/2013   "weren't able to do the ablation" (01/04/2013)  . COLONOSCOPY  12/12/2013  . COMBINED AUGMENTATION MAMMAPLASTY AND ABDOMINOPLASTY Bilateral 2002  . ELECTROPHYSIOLOGY STUDY N/A 01/04/2013   Procedure: ELECTROPHYSIOLOGY STUDY;  Surgeon: Evans Lance, MD;  Location: [facility] CATH LAB;  Service: Cardiovascular;  Laterality: N/A;  .  SUPRAVENTRICULAR TACHYCARDIA ABLATION N/A 01/04/2013   Procedure: SUPRAVENTRICULAR TACHYCARDIA ABLATION;  Surgeon: Evans Lance, MD;  Location: [facility] CATH LAB;  Service: Cardiovascular;  Laterality: N/A;  . TUBAL LIGATION    . UTERINE FIBROID SURGERY  ?09/2012  . VAGINAL HYSTERECTOMY Bilateral 03/27/2014   Procedure: HYSTERECTOMY VAGINAL WITH BILATERAL SALPINGECTOMY;  Surgeon: Lavonia Drafts, MD;  Location: [place] [facility];  Service: Gynecology;  Laterality: Bilateral;    OB History    Gravida  3   Para  3   Term  3   Preterm  0   AB  0   Living  4     SAB  0   TAB  0   Ectopic  0   Multiple  1   Live Births               Home Medications    Prior to Admission medications   Medication Sig Start Date End Date Taking? Authorizing Provider  acyclovir (ZOVIRAX) 400 MG tablet Take 1 tablet (400 mg total) by mouth 3 (three) times daily. 08/21/18   Jaynee Eagles, PA-C  Naproxen Sodium (ALEVE PO) Take 440 mg by mouth as needed (pain). Reported on 07/10/2015    [provider]  ondansetron (ZOFRAN) 4 MG tablet Take 1 tablet (4 mg total) by mouth every 6 (six) hours. 09/08/18   Raylene Everts, MD  Family History Family History  Problem Relation Age of Onset  . Cancer Brother   . Cancer Paternal Aunt        x 2 unkown type  . Heart disease Maternal Grandmother   . Heart Problems Mother        apid heartbeat  . Colon cancer Neg Hx     Social History Social History   Tobacco Use  . Smoking status: Former Smoker    Packs/day: 0.33    Years: 3.00    Pack years: 0.99    Types: Cigarettes    Last attempt to quit: 12/27/1984    Years since quitting: 33.7  . Smokeless tobacco: Never Used  Substance Use Topics  . Alcohol use: Not Currently  . Drug use: Not Currently     Allergies   Patient has no known allergies.   Review of Systems Review of Systems  Constitutional: Positive for fatigue. Negative for chills and fever.  HENT: Negative for ear pain  and sore throat.   Eyes: Negative for pain and visual disturbance.  Respiratory: Negative for cough and shortness of breath.   Cardiovascular: Negative for chest pain and palpitations.  Gastrointestinal: Positive for diarrhea, nausea and vomiting. Negative for abdominal pain.  Genitourinary: Negative for dysuria and hematuria.  Musculoskeletal: Negative for arthralgias and back pain.  Skin: Negative for color change and rash.  Neurological: Negative for seizures and syncope.  All other systems reviewed and are negative.    Physical Exam Triage Vital Signs ED Triage Vitals  Enc Vitals Group     BP 09/08/18 1213 (!) 145/88     Pulse Rate 09/08/18 1213 76     Resp 09/08/18 1213 20     Temp 09/08/18 1213 97.6 F (36.4 C)     Temp Source 09/08/18 1213 Oral     SpO2 09/08/18 1213 97 %     Weight --      Height --      Head Circumference --      Peak Flow --      Pain Score 09/08/18 1215 7     Pain Loc --      Pain Edu? --      Excl. in [place]? --    No data found.  Updated Vital Signs BP (!) 145/88 (BP Location: Right Arm)   Pulse 76   Temp 97.6 F (36.4 C) (Oral)   Resp 20   LMP 03/15/2014   SpO2 97%   Visual Acuity Right Eye Distance:   Left Eye Distance:   Bilateral Distance:    Right Eye Near:   Left Eye Near:    Bilateral Near:     Physical Exam Constitutional:      General: She is not in acute distress.    Appearance: She is well-developed and normal weight.  HENT:     Head: Normocephalic and atraumatic.     Mouth/Throat:     Mouth: Mucous membranes are moist.  Eyes:     Conjunctiva/sclera: Conjunctivae normal.     Pupils: Pupils are equal, round, and reactive to light.  Neck:     Musculoskeletal: Normal range of motion.  Cardiovascular:     Rate and Rhythm: Normal rate and regular rhythm.     Heart sounds: Normal heart sounds.  Pulmonary:     Effort: Pulmonary effort is normal. No respiratory distress.  Abdominal:     General: Abdomen is flat.  Bowel sounds are increased. There is no distension.  Palpations: Abdomen is soft.     Tenderness: There is generalized abdominal tenderness.     Comments: Very mild generalized tenderness.  No organomegaly  Musculoskeletal: Normal range of motion.  Skin:    General: Skin is warm and dry.  Neurological:     Mental Status: She is alert.      UC Treatments / Results  Labs (all labs ordered are listed, but only abnormal results are displayed) Labs Reviewed - No data to display  EKG None  Radiology No results found.  Procedures Procedures (including critical care time)  Medications Ordered in UC Medications - No data to display  Initial Impression / Assessment and Plan / UC Course  I have reviewed the triage vital signs and the nursing notes.  Pertinent labs & imaging results that were available during my care of the patient were reviewed by me and considered in my medical decision making (see chart for details).     We reviewed viral gastroenteritis.  Importance of hydration.  Gradually reintroducing diet.  Medications for symptoms. Final Clinical Impressions(s) / UC Diagnoses   Final diagnoses:  Nausea vomiting and diarrhea     Discharge Instructions     Take the Zofran for nausea and vomiting 20 to 30 minutes later you can try drinking more fluids When you are able to keep down fluids adequately, you may start bland diet The diarrhea usually goes away by itself in a day or 2 If you have persistent diarrhea you may take Imodium per package directions Avoid fatty foods, fried foods, highly spicy foods for a week or 2 Return if you are unable to keep down food or fluids within the next day or 2   ED Prescriptions    Medication Sig Dispense Auth. Provider   ondansetron (ZOFRAN) 4 MG tablet Take 1 tablet (4 mg total) by mouth every 6 (six) hours. 12 tablet Raylene Everts, MD     Controlled Substance Prescriptions [place] Controlled Substance Registry  consulted? Not Applicable   Raylene Everts, MD 09/08/18 2108

## 2018-09-08 NOTE — ED Triage Notes (Signed)
PT C/O: abd pain onse 2 days associated w/vomiting.... reports she's not able to hold down her food.   Sx also include diarrhea  DENIES: fevers  TAKING MEDS: none  A&O x4... NAD... Ambulatory

## 2018-10-13 ENCOUNTER — Other Ambulatory Visit: Payer: 59

## 2019-04-10 ENCOUNTER — Encounter (HOSPITAL_COMMUNITY): Payer: Self-pay | Admitting: Emergency Medicine

## 2019-04-10 ENCOUNTER — Ambulatory Visit (HOSPITAL_COMMUNITY)
Admission: EM | Admit: 2019-04-10 | Discharge: 2019-04-10 | Disposition: A | Discharge status: Home / Self Care | Payer: 59 | Attending: Family Medicine | Admitting: Family Medicine

## 2019-04-10 ENCOUNTER — Other Ambulatory Visit: Payer: Self-pay

## 2019-04-10 DIAGNOSIS — R002 Palpitations: Secondary | ICD-10-CM

## 2019-04-10 MED ORDER — METOPROLOL SUCCINATE ER 25 MG PO TB24: 25.0000 mg | ORAL_TABLET | Freq: Every day | ORAL | 3 refills | Status: DC

## 2019-04-10 NOTE — ED Provider Notes (Signed)
[place]    CSN: [id number] Arrival date & time: 04/10/19  1235      History   Chief Complaint Chief Complaint  Patient presents with  . Palpitations    HPI [name] is a 53 y.o. female.   This a 53 year old established woman with a history of cardiac arrhythmia who comes in complaining about palpitations.  She had been on flecainide but ran out of her medicine and has not seen a cardiologist in over a year.   These palpitations are mainly bothering patient when she is trying to go to sleep.  She has no shortness of breath or chest pain.  Patient is a Glass blower/designer.  In the past patient's gotten quite a bit of relief when she is done exercise.  She stopped exercising regularly because of the COVID-19 epidemic.     Past Medical History:  Diagnosis Date  . Chest pain   . Chronic anemia   . Difficulty sleeping   . Dysmenorrhea 2011  . Dysrhythmia    irregular heart beats  . Heart palpitations   . History of uterine fibroid   . Menorrhagia   . Shortness of breath    "associated w/high heart rate" (01/04/2013)  . Supraventricular tachycardia [facility])     Patient Active Problem List   Diagnosis Date Noted  . Encounter for annual routine gynecological examination 09/30/2016  . Post-operative state 03/27/2014  . Fibroids 09/20/2013  . Dysmenorrhea 11/17/2012  . ALCOHOL USE 11/13/2008  . SUPRAVENTRICULAR TACHYCARDIA 11/13/2008  . CHEST PAIN 11/13/2008  . PALPITATIONS 09/23/2006    Past Surgical History:  Procedure Laterality Date  . ABDOMINAL HYSTERECTOMY    . AUGMENTATION MAMMAPLASTY Bilateral   . CARDIAC ELECTROPHYSIOLOGY MAPPING AND ABLATION  ~ 2008; 01/04/2013   "weren't able to do the ablation" (01/04/2013)  . COLONOSCOPY  12/12/2013  . COMBINED AUGMENTATION MAMMAPLASTY AND ABDOMINOPLASTY Bilateral 2002  . ELECTROPHYSIOLOGY STUDY N/A 01/04/2013   Procedure: ELECTROPHYSIOLOGY STUDY;  Surgeon: Evans Lance, MD;  Location: [facility] CATH  LAB;  Service: Cardiovascular;  Laterality: N/A;  . SUPRAVENTRICULAR TACHYCARDIA ABLATION N/A 01/04/2013   Procedure: SUPRAVENTRICULAR TACHYCARDIA ABLATION;  Surgeon: Evans Lance, MD;  Location: [facility] CATH LAB;  Service: Cardiovascular;  Laterality: N/A;  . TUBAL LIGATION    . UTERINE FIBROID SURGERY  ?09/2012  . VAGINAL HYSTERECTOMY Bilateral 03/27/2014   Procedure: HYSTERECTOMY VAGINAL WITH BILATERAL SALPINGECTOMY;  Surgeon: Lavonia Drafts, MD;  Location: [place] [facility];  Service: Gynecology;  Laterality: Bilateral;    OB History    Gravida  3   Para  3   Term  3   Preterm  0   AB  0   Living  4     SAB  0   TAB  0   Ectopic  0   Multiple  1   Live Births               Home Medications    Prior to Admission medications   Medication Sig Start Date End Date Taking? Authorizing Provider  metoprolol succinate (TOPROL-XL) 25 MG 24 hr tablet Take 1 tablet (25 mg total) by mouth at bedtime. 04/10/19   Robyn Haber, MD    Family History Family History  Problem Relation Age of Onset  . Cancer Brother   . Cancer Paternal Aunt        x 2 unkown type  . Heart disease Maternal Grandmother   . Heart Problems Mother  apid heartbeat  . Colon cancer Neg Hx     Social History Social History   Tobacco Use  . Smoking status: Former Smoker    Packs/day: 0.33    Years: 3.00    Pack years: 0.99    Types: Cigarettes    Quit date: 12/27/1984    Years since quitting: 34.3  . Smokeless tobacco: Never Used  Substance Use Topics  . Alcohol use: Not Currently  . Drug use: Not Currently     Allergies   Patient has no known allergies.   Review of Systems Review of Systems  Cardiovascular: Positive for palpitations.  All other systems reviewed and are negative.    Physical Exam Triage Vital Signs ED Triage Vitals  Enc Vitals Group     BP 04/10/19 1257 (!) 139/94     Pulse Rate 04/10/19 1257 81     Resp 04/10/19 1257 18     Temp 04/10/19 1257 98.5 F  (36.9 C)     Temp Source 04/10/19 1257 Oral     SpO2 04/10/19 1257 98 %     Weight --      Height --      Head Circumference --      Peak Flow --      Pain Score 04/10/19 1258 0     Pain Loc --      Pain Edu? --      Excl. in [place]? --    No data found.  Updated Vital Signs BP (!) 139/94 (BP Location: Right Arm)   Pulse 81   Temp 98.5 F (36.9 C) (Oral)   Resp 18   LMP 03/15/2014   SpO2 98%    Physical Exam Vitals signs and nursing note reviewed.  Constitutional:      Appearance: Normal appearance.  Neck:     Musculoskeletal: Normal range of motion and neck supple.  Cardiovascular:     Rate and Rhythm: Normal rate and regular rhythm.  Pulmonary:     Effort: Pulmonary effort is normal.     Breath sounds: Normal breath sounds.  Musculoskeletal: Normal range of motion.  Skin:    General: Skin is warm.  Neurological:     General: No focal deficit present.     Mental Status: She is alert and oriented to person, place, and time.  Psychiatric:        Mood and Affect: Mood normal.        Behavior: Behavior normal.      UC Treatments / Results  Labs (all labs ordered are listed, but only abnormal results are displayed) Labs Reviewed - No data to display  EKG   Radiology No results found.  Procedures Procedures (including critical care time)  Medications Ordered in UC Medications - No data to display  Initial Impression / Assessment and Plan / UC Course  I have reviewed the triage vital signs and the nursing notes.  Pertinent labs & imaging results that were available during my care of the patient were reviewed by me and considered in my medical decision making (see chart for details).    Final Clinical Impressions(s) / UC Diagnoses   Final diagnoses:  Palpitations     Discharge Instructions     Do at least 10 minutes of vigorous exercise each day.  If your palpitations are really bothering you at night, try taking melatonin 2 mg before going to  sleep.    ED Prescriptions    Medication Sig Dispense Auth. Provider  metoprolol succinate (TOPROL-XL) 25 MG 24 hr tablet Take 1 tablet (25 mg total) by mouth at bedtime. 30 tablet Robyn Haber, MD     Controlled Substance Prescriptions [place] Controlled Substance Registry consulted? Not Applicable   Robyn Haber, MD 04/10/19 1332

## 2019-04-10 NOTE — ED Triage Notes (Signed)
Pt sts palpitations with hx of same, now improved; pt sts used to take flecanide but hasn't seen cardiology in over a year and is out of meds

## 2019-04-10 NOTE — Discharge Instructions (Addendum)
Do at least 10 minutes of vigorous exercise each day.  If your palpitations are really bothering you at night, try taking melatonin 2 mg before going to sleep.

## 2019-04-11 ENCOUNTER — Encounter: Payer: Self-pay | Admitting: Cardiology

## 2019-04-11 ENCOUNTER — Encounter: Payer: Self-pay | Admitting: *Deleted

## 2019-04-11 ENCOUNTER — Ambulatory Visit: Payer: 59 | Admitting: Cardiology

## 2019-04-11 VITALS — BP 110/72 | HR 73 | Ht 59.0 in | Wt 143.0 lb

## 2019-04-11 DIAGNOSIS — R9431 Abnormal electrocardiogram [ECG] [EKG]: Secondary | ICD-10-CM

## 2019-04-11 DIAGNOSIS — I471 Supraventricular tachycardia: Secondary | ICD-10-CM | POA: Diagnosis not present

## 2019-04-11 MED ORDER — METOPROLOL SUCCINATE ER 25 MG PO TB24: 25.0000 mg | ORAL_TABLET | Freq: Every day | ORAL | 3 refills | Status: DC

## 2019-04-11 NOTE — Patient Instructions (Signed)
Medication Instructions:  The current medical regimen is effective;  continue present plan and medications.  If you need a refill on your cardiac medications before your next appointment, please call your pharmacy.   Testing/Procedures: Your physician has requested that you have an echocardiogram. Echocardiography is a painless test that uses sound waves to create images of your heart. It provides your doctor with information about the size and shape of your heart and how well your heart's chambers and valves are working. This procedure takes approximately one hour. There are no restrictions for this procedure.  Your physician has recommended that you wear a holter monitor for 14 days. Holter monitors are medical devices that record the heart's electrical activity. Doctors most often use these monitors to diagnose arrhythmias. Arrhythmias are problems with the speed or rhythm of the heartbeat. The monitor is a small, portable device. You can wear one while you do your normal daily activities. This is usually used to diagnose what is causing palpitations/syncope (passing out).  Follow-Up: At [facility], you and your health needs are our priority.  As part of our continuing mission to provide you with exceptional heart care, we have created designated Provider Care Teams.  These Care Teams include your primary Cardiologist (physician) and Advanced Practice Providers (APPs -  Physician Assistants and Nurse Practitioners) who all work together to provide you with the care you need, when you need it. You will need a follow up appointment in 4 weeks.  Please call our office 2 months in advance to schedule this appointment.  You may see Candee Furbish, MD or one of the following Advanced Practice Providers on your designated Care Team:   Truitt Merle, NP Cecilie Kicks, NP . Kathyrn Drown, NP  Thank you for choosing [facility]!!

## 2019-04-11 NOTE — Progress Notes (Signed)
Cardiology Office Note:    Date:  04/11/2019   ID:  [name], DOB Apr 02, 1966, MRN [id number]  PCP:  Patient, No Pcp Per  Cardiologist:  Candee Furbish, MD  Electrophysiologist:  None   Referring MD: Robyn Haber, MD     History of Present Illness:    [name] is a 53 y.o. female here for the evaluation of palpitations at the request of Dr. Joseph Art.  Was seen in the emergency department on 04/10/2019 with prior history of palpitations and cardiac arrhythmia.  She has been on flecainide per the emergency department but ran out of the medicine and has not seen cardiology in over a year.  Had been taking Flecainide she states, but stopped working for her, then changed to propafenone. But not worked.   From looking at Dr. Tanna Furry last note in 2014, no ablation was attempted as SVT was in the left atrium and could not sustain SVT prior to transseptal puncture.  She was discharged at that time on propafenone 225 mg twice daily.  Not sure if she ever took this.  She said that she felt bad on metoprolol.  Lost to follow-up.  She is a Glass blower/designer.  Palpitations mostly bother her when she is trying to go to sleep.  No chest pain, no shortness of breath, no syncope, no bleeding.  Back in 2014 she had a SVT ablation by Dr. Lovena Le.  In the ER, metoprolol 25 mg was given. Not taking.   Beating fast.   Had a few glasses of wine brought it on. Minimal SOB. Felt racing all night.   Past Medical History:  Diagnosis Date  . ALCOHOL USE 11/13/2008   Qualifier: Diagnosis of  By: Darrick Meigs    . 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 Chest pain   . CHEST PAIN 11/13/2008   Qualifier: Diagnosis of  By: Darrick Meigs    . Chronic anemia   . Difficulty sleeping   . Dysmenorrhea 2011  . Dysrhythmia    irregular heart beats  . Fibroids 09/20/2013  . Heart palpitations   . History of uterine fibroid   . Menorrhagia   . Palpitations 09/23/2006   Qualifier:  Diagnosis of  By: Benna Dunks    . Shortness of breath    "associated w/high heart rate" (01/04/2013)  . SUPRAVENTRICULAR TACHYCARDIA 11/13/2008   Qualifier: Diagnosis of  By: Darrick Meigs    . Supraventricular tachycardia [facility])     Past Surgical History:  Procedure Laterality Date  . ABDOMINAL HYSTERECTOMY    . AUGMENTATION MAMMAPLASTY Bilateral   . CARDIAC ELECTROPHYSIOLOGY MAPPING AND ABLATION  ~ 2008; 01/04/2013   "weren't able to do the ablation" (01/04/2013)  . COLONOSCOPY  12/12/2013  . COMBINED AUGMENTATION MAMMAPLASTY AND ABDOMINOPLASTY Bilateral 2002  . ELECTROPHYSIOLOGY STUDY N/A 01/04/2013   Procedure: ELECTROPHYSIOLOGY STUDY;  Surgeon: Evans Lance, MD;  Location: [facility] CATH LAB;  Service: Cardiovascular;  Laterality: N/A;  . SUPRAVENTRICULAR TACHYCARDIA ABLATION N/A 01/04/2013   Procedure: SUPRAVENTRICULAR TACHYCARDIA ABLATION;  Surgeon: Evans Lance, MD;  Location: [facility] CATH LAB;  Service: Cardiovascular;  Laterality: N/A;  . TUBAL LIGATION    . UTERINE FIBROID SURGERY  ?09/2012  . VAGINAL HYSTERECTOMY Bilateral 03/27/2014   Procedure: HYSTERECTOMY VAGINAL WITH BILATERAL SALPINGECTOMY;  Surgeon: Lavonia Drafts, MD;  Location: [place] [facility];  Service: Gynecology;  Laterality: Bilateral;    Current Medications: Current Meds  Medication Sig  . metoprolol succinate (TOPROL-XL) 25 MG 24 hr tablet  Take 1 tablet (25 mg total) by mouth daily.  . [DISCONTINUED] metoprolol succinate (TOPROL-XL) 25 MG 24 hr tablet Take 25 mg by mouth daily.     Allergies:   Patient has no known allergies.   Social History   Socioeconomic History  . Marital status: Legally Separated    Spouse name: Not on file  . Number of children: 4  . Years of education: Not on file  . Highest education level: Not on file  Occupational History  . Occupation: order IT consultant: [employer]  . Financial resource strain: Not on file  . Food insecurity    Worry: Not on file     Inability: Not on file  . Transportation needs    Medical: Not on file    Non-medical: Not on file  Tobacco Use  . Smoking status: Former Smoker    Packs/day: 0.33    Years: 3.00    Pack years: 0.99    Types: Cigarettes    Quit date: 12/27/1984    Years since quitting: 34.3  . Smokeless tobacco: Never Used  Substance and Sexual Activity  . Alcohol use: Not Currently  . Drug use: Not Currently  . Sexual activity: Yes  Lifestyle  . Physical activity    Days per week: Not on file    Minutes per session: Not on file  . Stress: Not on file  Relationships  . Social Herbalist on phone: Not on file    Gets together: Not on file    Attends religious service: Not on file    Active member of club or organization: Not on file    Attends meetings of clubs or organizations: Not on file    Relationship status: Not on file  Other Topics Concern  . Not on file  Social History Narrative  . Not on file     Family History: The patient's family history includes Cancer in her brother and paternal aunt; Heart Problems in her mother; Heart disease in her maternal grandmother. There is no history of Colon cancer.  ROS:   Please see the history of present illness.     All other systems reviewed and are negative.  EKGs/Labs/Other Studies Reviewed:    The following studies were reviewed today:  Echocardiogram 2013:  - Left ventricle: The cavity size was normal. Wall thickness  was normal. Systolic function was normal. The estimated  ejection fraction was in the range of 60% to 65%. Wall  motion was normal; there were no regional wall motion  abnormalities. Left ventricular diastolic function  parameters were normal.  - Atrial septum: No defect or patent foramen ovale was  identified.   Event monitor 2013: -Nonsustained SVT  EKG:  EKG is not ordered today.  The ekg ordered today demonstrates 04/10/2019- sinus rhythm 80 with no other abnormalities.  Recent Labs:  No results found for requested labs within last 8760 hours.  Recent Lipid Panel No results found for: CHOL, TRIG, HDL, CHOLHDL, VLDL, LDLCALC, LDLDIRECT  Physical Exam:    VS:  BP 110/72   Pulse 73   Ht 4\' 11"  (1.499 m)   Wt 143 lb (64.9 kg)   LMP 03/15/2014   SpO2 97%   BMI 28.88 kg/m     Wt Readings from Last 3 Encounters:  04/11/19 143 lb (64.9 kg)  03/15/18 145 lb 4.8 oz (65.9 kg)  08/02/17 142 lb (64.4 kg)     GEN:  Well nourished, well developed in no acute distress HEENT: Normal NECK: No JVD; No carotid bruits LYMPHATICS: No lymphadenopathy CARDIAC: RRR, no murmurs, rubs, gallops RESPIRATORY:  Clear to auscultation without rales, wheezing or rhonchi  ABDOMEN: Soft, non-tender, non-distended MUSCULOSKELETAL:  No edema; No deformity  SKIN: Warm and dry NEUROLOGIC:  Alert and oriented x 3 PSYCHIATRIC:  Normal affect   ASSESSMENT:    1. PSVT (paroxysmal supraventricular tachycardia) ([place])   2. Nonspecific abnormal electrocardiogram (ECG) (EKG)    PLAN:    In order of problems listed above:  PSVT palpitations with history of attempted SVT ablation Dr. Lovena Le 2014 but on left atrial side, was not able to invoke SVT, previously on propafenone but looks like she never really took this medication from 2014 - We will go ahead and check an echocardiogram to ensure proper structure and function and update since it is been 7 years. - I will also check a ZIO patch monitor 14 days. - She has metoprolol XL 25 mg once a day.  I encouraged her to try to use this.  She is hesitant to take too many medications. -If we need to, we can always try propafenone in the future as well.  Also, if necessary, we can get her back into the hands of Dr. Lovena Le for further advice.  Medication Adjustments/Labs and Tests Ordered: Current medicines are reviewed at length with the patient today.  Concerns regarding medicines are outlined above.  Orders Placed This Encounter  Procedures  . LONG  TERM MONITOR (3-14 DAYS)  . ECHOCARDIOGRAM COMPLETE   Meds ordered this encounter  Medications  . metoprolol succinate (TOPROL-XL) 25 MG 24 hr tablet    Sig: Take 1 tablet (25 mg total) by mouth daily.    Dispense:  90 tablet    Refill:  3    Patient Instructions  Medication Instructions:  The current medical regimen is effective;  continue present plan and medications.  If you need a refill on your cardiac medications before your next appointment, please call your pharmacy.   Testing/Procedures: Your physician has requested that you have an echocardiogram. Echocardiography is a painless test that uses sound waves to create images of your heart. It provides your doctor with information about the size and shape of your heart and how well your heart's chambers and valves are working. This procedure takes approximately one hour. There are no restrictions for this procedure.  Your physician has recommended that you wear a holter monitor for 14 days. Holter monitors are medical devices that record the heart's electrical activity. Doctors most often use these monitors to diagnose arrhythmias. Arrhythmias are problems with the speed or rhythm of the heartbeat. The monitor is a small, portable device. You can wear one while you do your normal daily activities. This is usually used to diagnose what is causing palpitations/syncope (passing out).  Follow-Up: At [facility], you and your health needs are our priority.  As part of our continuing mission to provide you with exceptional heart care, we have created designated Provider Care Teams.  These Care Teams include your primary Cardiologist (physician) and Advanced Practice Providers (APPs -  Physician Assistants and Nurse Practitioners) who all work together to provide you with the care you need, when you need it. You will need a follow up appointment in 4 weeks.  Please call our office 2 months in advance to schedule this appointment.  You may  see Candee Furbish, MD or one of the following Advanced Practice Providers  on your designated Care Team:   Truitt Merle, NP Cecilie Kicks, NP . Kathyrn Drown, NP  Thank you for choosing [facility]!!         Signed, Candee Furbish, MD  04/11/2019 9:12 AM    [place]

## 2019-04-18 ENCOUNTER — Other Ambulatory Visit (HOSPITAL_COMMUNITY): Payer: 59

## 2019-04-19 ENCOUNTER — Telehealth: Payer: Self-pay | Admitting: *Deleted

## 2019-04-19 NOTE — Telephone Encounter (Signed)
14 day ZIO XT long term holter monitor to be mailed to the patients home.  Instructions reviewed briefly as they are included in the monitor kit. 

## 2019-04-21 ENCOUNTER — Ambulatory Visit (HOSPITAL_COMMUNITY): Payer: 59 | Attending: Cardiology

## 2019-04-21 ENCOUNTER — Other Ambulatory Visit: Payer: Self-pay

## 2019-04-21 DIAGNOSIS — R9431 Abnormal electrocardiogram [ECG] [EKG]: Secondary | ICD-10-CM | POA: Diagnosis present

## 2019-04-21 DIAGNOSIS — I471 Supraventricular tachycardia: Secondary | ICD-10-CM | POA: Insufficient documentation

## 2019-04-30 ENCOUNTER — Ambulatory Visit (INDEPENDENT_AMBULATORY_CARE_PROVIDER_SITE_OTHER): Payer: 59

## 2019-04-30 DIAGNOSIS — I471 Supraventricular tachycardia: Secondary | ICD-10-CM

## 2019-04-30 DIAGNOSIS — R9431 Abnormal electrocardiogram [ECG] [EKG]: Secondary | ICD-10-CM

## 2019-06-02 ENCOUNTER — Telehealth: Payer: Self-pay | Admitting: Cardiology

## 2019-06-02 NOTE — Telephone Encounter (Signed)
New message  Patient is calling in to get the results for her monitor that she wore. States that she called the company that supplied the monitor and they told her that the results were sent electronically on May 31, 2019. Please give a call back about the results.

## 2019-06-05 NOTE — Telephone Encounter (Signed)
Results being imported this morning.  Results will be available for patient after finalized by Dr. Marlou Porch.

## 2019-06-08 ENCOUNTER — Telehealth: Payer: Self-pay | Admitting: *Deleted

## 2019-06-08 DIAGNOSIS — I471 Supraventricular tachycardia: Secondary | ICD-10-CM

## 2019-06-08 NOTE — Telephone Encounter (Signed)
I spoke with pt and reviewed results with her. Referral placed for pt to see Dr Lovena Le Pt is requesting monitor result summary be mailed to her.  She does not use a computer very much and is not able to sign up for my chart.

## 2019-06-08 NOTE — Telephone Encounter (Signed)
-----   Message from Jerline Pain, MD sent at 06/08/2019  1:21 PM EST -----  Occasional episodes of paroxysmal atrial tachycardia (PAT) longest approximately 20 seconds, Avg. HR 154-170 bpm. Self terminating.  Rare PAC's, PVC's (occasionally symptomatic)  Normal sinus rhythm otherwise.   Please have her once again see Dr. Lovena Le with EP for further suggestions for suppression of PAT. Thanks Candee Furbish, MD

## 2019-06-09 ENCOUNTER — Encounter: Payer: Self-pay | Admitting: *Deleted

## 2019-06-09 NOTE — Telephone Encounter (Signed)
Letter of monitor results mailed to pt's home address as requested.

## 2019-06-15 ENCOUNTER — Encounter: Payer: Self-pay | Admitting: Internal Medicine

## 2019-07-07 ENCOUNTER — Other Ambulatory Visit: Payer: Self-pay

## 2019-07-07 ENCOUNTER — Ambulatory Visit (INDEPENDENT_AMBULATORY_CARE_PROVIDER_SITE_OTHER): Payer: 59 | Admitting: Internal Medicine

## 2019-07-07 VITALS — BP 130/86 | HR 86 | Ht 59.0 in | Wt 150.6 lb

## 2019-07-07 DIAGNOSIS — R002 Palpitations: Secondary | ICD-10-CM

## 2019-07-07 NOTE — Patient Instructions (Addendum)
Medication Instructions:  Continue your current medications *If you need a refill on your cardiac medications before your next appointment, please call your pharmacy*  Lab Work: None ordered.  If you have labs (blood work) drawn today and your tests are completely normal, you will receive your results only by: Marland Kitchen MyChart Message (if you have MyChart) OR . A paper copy in the mail If you have any lab test that is abnormal or we need to change your treatment, we will call you to review the results.  Testing/Procedures: None ordered.   Follow-Up: At [facility], you and your health needs are our priority.  As part of our continuing mission to provide you with exceptional heart care, we have created designated Provider Care Teams.  These Care Teams include your primary Cardiologist (physician) and Advanced Practice Providers (APPs -  Physician Assistants and Nurse Practitioners) who all work together to provide you with the care you need, when you need it.  Your next appointment:  09/10/2018 @ 8am

## 2019-07-07 NOTE — Progress Notes (Signed)
HPI [name] returns today for followup after a 6 year absence from our EP clinic. She has a h/o SVT and was found to have NS atrial tachy from the [place]. She has had occaisional symptoms and has been on a beta blocker. In the interim, she has tried to lose weight. She is pending liposuction. No Known Allergies   Current Outpatient Medications  Medication Sig Dispense Refill  . metoprolol succinate (TOPROL-XL) 25 MG 24 hr tablet Take 1 tablet (25 mg total) by mouth daily. 90 tablet 3   No current facility-administered medications for this visit.     Past Medical History:  Diagnosis Date  . ALCOHOL USE 11/13/2008   Qualifier: Diagnosis of  By: Darrick Meigs    . 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 Chest pain   . CHEST PAIN 11/13/2008   Qualifier: Diagnosis of  By: Darrick Meigs    . Chronic anemia   . Difficulty sleeping   . Dysmenorrhea 2011  . Dysrhythmia    irregular heart beats  . Fibroids 09/20/2013  . Heart palpitations   . History of uterine fibroid   . Menorrhagia   . Palpitations 09/23/2006   Qualifier: Diagnosis of  By: Benna Dunks    . Shortness of breath    "associated w/high heart rate" (01/04/2013)  . SUPRAVENTRICULAR TACHYCARDIA 11/13/2008   Qualifier: Diagnosis of  By: Darrick Meigs    . Supraventricular tachycardia ([place])     ROS:   All systems reviewed and negative except as noted in the HPI.   Past Surgical History:  Procedure Laterality Date  . ABDOMINAL HYSTERECTOMY    . AUGMENTATION MAMMAPLASTY Bilateral   . CARDIAC ELECTROPHYSIOLOGY MAPPING AND ABLATION  ~ 2008; 01/04/2013   "weren't able to do the ablation" (01/04/2013)  . COLONOSCOPY  12/12/2013  . COMBINED AUGMENTATION MAMMAPLASTY AND ABDOMINOPLASTY Bilateral 2002  . ELECTROPHYSIOLOGY STUDY N/A 01/04/2013   Procedure: ELECTROPHYSIOLOGY STUDY;  Surgeon: Evans Lance, MD;  Location[facility] CATH LAB;  Service: Cardiovascular;  Laterality: N/A;  . SUPRAVENTRICULAR TACHYCARDIA  ABLATION N/A 01/04/2013   Procedure: SUPRAVENTRICULAR TACHYCARDIA ABLATION;  Surgeon: Evans Lance, MD;  Location[facility] CATH LAB;  Service: Cardiovascular;  Laterality: N/A;  . TUBAL LIGATION    . UTERINE FIBROID SURGERY  ?09/2012  . VAGINAL HYSTERECTOMY Bilateral 03/27/2014   Procedure: HYSTERECTOMY VAGINAL WITH BILATERAL SALPINGECTOMY;  Surgeon: Lavonia Drafts, MD;  Location: [place] [facility];  Service: Gynecology;  Laterality: Bilateral;     Family History  Problem Relation Age of Onset  . Cancer Brother   . Cancer Paternal Aunt        x 2 unkown type  . Heart disease Maternal Grandmother   . Heart Problems Mother        apid heartbeat  . Colon cancer Neg Hx      Social History   Socioeconomic History  . Marital status: Legally Separated    Spouse name: Not on file  . Number of children: 4  . Years of education: Not on file  . Highest education level: Not on file  Occupational History  . Occupation: order IT consultant: [employer]  Tobacco Use  . Smoking status: Former Smoker    Packs/day: 0.33    Years: 3.00    Pack years: 0.99    Types: Cigarettes    Quit date: 12/27/1984    Years since quitting: 34.5  . Smokeless tobacco: Never Used  Substance and  Sexual Activity  . Alcohol use: Not Currently  . Drug use: Not Currently  . Sexual activity: Yes  Other Topics Concern  . Not on file  Social History Narrative  . Not on file   Social Determinants of Health   Financial Resource Strain:   . Difficulty of Paying Living Expenses: Not on file  Food Insecurity:   . Worried About Charity fundraiser in the Last Year: Not on file  . Ran Out of Food in the Last Year: Not on file  Transportation Needs:   . Lack of Transportation (Medical): Not on file  . Lack of Transportation (Non-Medical): Not on file  Physical Activity:   . Days of Exercise per Week: Not on file  . Minutes of Exercise per Session: Not on file  Stress:   . Feeling of Stress : Not on file    Social Connections:   . Frequency of Communication with Friends and Family: Not on file  . Frequency of Social Gatherings with Friends and Family: Not on file  . Attends Religious Services: Not on file  . Active Member of Clubs or Organizations: Not on file  . Attends Archivist Meetings: Not on file  . Marital Status: Not on file  Intimate Partner Violence:   . Fear of Current or Ex-Partner: Not on file  . Emotionally Abused: Not on file  . Physically Abused: Not on file  . Sexually Abused: Not on file     BP 130/86   Pulse 86   Ht 4\' 11"  (1.499 m)   Wt 150 lb 9.6 oz (68.3 kg)   LMP 03/15/2014   BMI 30.42 kg/m   Physical Exam:  Well appearing 53 yo woman who looks younger than her stated age, NAD HEENT: Unremarkable Neck:  6 cm JVD, no thyromegally Lymphatics:  No adenopathy Back:  No CVA tenderness Lungs:  Clear with no wheezes HEART:  Regular rate rhythm, no murmurs, no rubs, no clicks Abd:  soft, positive bowel sounds, no organomegally, no rebound, no guarding Ext:  2 plus pulses, no edema, no cyanosis, no clubbing Skin:  No rashes no nodules Neuro:  CN II through XII intact, motor grossly intact   DEVICE  Normal device function.  See PaceArt for details.   Assess/Plan: 1. Palpitations - her symptoms appear to be fairly well controlled. We discussed the beta blocker and I have asked her to take an additional dose as needed if her palpitations increase. If her symptoms do not improve we would consider propafenone. As she never had sustained palpitations before, I have not inclined to recommend repeat ablation. 2. HTN - her bp is only minimally elevated. She will continue her current meds.  Mikle Bosworth.D.

## 2019-09-11 ENCOUNTER — Ambulatory Visit: Payer: 59 | Admitting: Internal Medicine

## 2019-11-17 ENCOUNTER — Ambulatory Visit (INDEPENDENT_AMBULATORY_CARE_PROVIDER_SITE_OTHER): Payer: Self-pay | Admitting: Internal Medicine

## 2019-11-17 ENCOUNTER — Other Ambulatory Visit: Payer: Self-pay

## 2019-11-17 ENCOUNTER — Encounter: Payer: Self-pay | Admitting: Internal Medicine

## 2019-11-17 VITALS — BP 122/84 | HR 74 | Ht 59.0 in | Wt 150.0 lb

## 2019-11-17 DIAGNOSIS — I10 Essential (primary) hypertension: Secondary | ICD-10-CM

## 2019-11-17 DIAGNOSIS — R002 Palpitations: Secondary | ICD-10-CM

## 2019-11-17 MED ORDER — METOPROLOL TARTRATE 25 MG PO TABS
ORAL_TABLET | ORAL | 11 refills | Status: DC
Start: 2019-11-17 — End: 2020-08-29

## 2019-11-17 NOTE — Progress Notes (Signed)
HPI [name] returns today for followup. She has a ha/o SVT who underwent EP study and catheter ablation remotely, but had occaisional recurrent symptoms. She has had symptomatic NS atrial tachycardia. She has been on a beta blocker on long acting toprol but the dose was limited by side effects. She has occaisional break through episodes. No Known Allergies   Current Outpatient Medications  Medication Sig Dispense Refill  . metoprolol succinate (TOPROL-XL) 25 MG 24 hr tablet Take 1 tablet (25 mg total) by mouth daily. 90 tablet 3   No current facility-administered medications for this visit.     Past Medical History:  Diagnosis Date  . ALCOHOL USE 11/13/2008   Qualifier: Diagnosis of  By: [name]    . 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 Chest pain   . CHEST PAIN 11/13/2008   Qualifier: Diagnosis of  By: [name]    . Chronic anemia   . Difficulty sleeping   . Dysmenorrhea 2011  . Dysrhythmia    irregular heart beats  . Fibroids 09/20/2013  . Heart palpitations   . History of uterine fibroid   . Menorrhagia   . Palpitations 09/23/2006   Qualifier: Diagnosis of  By: [name]    . Shortness of breath    "associated w/high heart rate" (01/04/2013)  . SUPRAVENTRICULAR TACHYCARDIA 11/13/2008   Qualifier: Diagnosis of  By: [name]    . Supraventricular tachycardia ([place])     ROS:   All systems reviewed and negative except as noted in the HPI.   Past Surgical History:  Procedure Laterality Date  . ABDOMINAL HYSTERECTOMY    . AUGMENTATION MAMMAPLASTY Bilateral   . CARDIAC ELECTROPHYSIOLOGY MAPPING AND ABLATION  ~ 2008; 01/04/2013   "weren't able to do the ablation" (01/04/2013)  . COLONOSCOPY  12/12/2013  . COMBINED AUGMENTATION MAMMAPLASTY AND ABDOMINOPLASTY Bilateral 2002  . ELECTROPHYSIOLOGY STUDY N/A 01/04/2013   Procedure: ELECTROPHYSIOLOGY STUDY;  Surgeon: Robin Lance, MD;  Location: [facility] CATH LAB;  Service: Cardiovascular;   Laterality: N/A;  . SUPRAVENTRICULAR TACHYCARDIA ABLATION N/A 01/04/2013   Procedure: SUPRAVENTRICULAR TACHYCARDIA ABLATION;  Surgeon: Robin Lance, MD;  [facility] CATH LAB;  Service: Cardiovascular;  Laterality: N/A;  . TUBAL LIGATION    . UTERINE FIBROID SURGERY  ?09/2012  . VAGINAL HYSTERECTOMY Bilateral 03/27/2014   Procedure: HYSTERECTOMY VAGINAL WITH BILATERAL SALPINGECTOMY;  Surgeon: Robin Drafts, MD;  Location: [place] [facility];  Service: Gynecology;  Laterality: Bilateral;     Family History  Problem Relation Age of Onset  . Cancer Brother   . Cancer Paternal Aunt        x 2 unkown type  . Heart disease Maternal Grandmother   . Heart Problems Mother        apid heartbeat  . Colon cancer Neg Hx      Social History   Socioeconomic History  . Marital status: Legally Separated    Spouse name: Not on file  . Number of children: 4  . Years of education: Not on file  . Highest education level: Not on file  Occupational History  . Occupation: order IT consultant: [name]  Tobacco Use  . Smoking status: Former Smoker    Packs/day: 0.33    Years: 3.00    Pack years: 0.99    Types: Cigarettes    Quit date: 12/27/1984    Years since quitting: 34.9  . Smokeless tobacco: Never Used  Substance and  Sexual Activity  . Alcohol use: Not Currently  . Drug use: Not Currently  . Sexual activity: Yes  Other Topics Concern  . Not on file  Social History Narrative  . Not on file   Social Determinants of Health   Financial Resource Strain:   . Difficulty of Paying Living Expenses:   Food Insecurity:   . Worried About Charity fundraiser in the Last Year:   . Arboriculturist in the Last Year:   Transportation Needs:   . Film/video editor (Medical):   Marland Kitchen Lack of Transportation (Non-Medical):   Physical Activity:   . Days of Exercise per Week:   . Minutes of Exercise per Session:   Stress:   . Feeling of Stress :   Social Connections:   . Frequency of  Communication with Friends and Family:   . Frequency of Social Gatherings with Friends and Family:   . Attends Religious Services:   . Active Member of Clubs or Organizations:   . Attends Archivist Meetings:   Marland Kitchen Marital Status:   Intimate Partner Violence:   . Fear of Current or Ex-Partner:   . Emotionally Abused:   Marland Kitchen Physically Abused:   . Sexually Abused:      BP 122/84   Pulse 74   Ht 4\' 11"  (1.499 m)   Wt 150 lb (68 kg)   LMP 03/15/2014   SpO2 96%   BMI 30.30 kg/m   Physical Exam:  Well appearing NAD HEENT: Unremarkable Neck:  No JVD, no thyromegally Lymphatics:  No adenopathy Back:  No CVA tenderness Lungs:  Clear with no wheezes HEART:  Regular rate rhythm, no murmurs, no rubs, no clicks Abd:  soft, positive bowel sounds, no organomegally, no rebound, no guarding Ext:  2 plus pulses, no edema, no cyanosis, no clubbing Skin:  No rashes no nodules Neuro:  CN II through XII intact, motor grossly intact  EKG - NSR   Assess/Plan: 1. NS atrial tachy - we discussed the treatment options. We discussed the treatment options. She had been on flecainide and propafenone previously. I recommended we try some short acting metoprolol to take for break through symptoms as she noted she had taken this previously.  2. HTN - her bp is well controlled. No change in her meds.  [name].D.

## 2019-11-17 NOTE — Patient Instructions (Addendum)
Medication Instructions:  Your physician has recommended you make the following change in your medication:   1.  Start taking metoprolol tartrate 25 mg-take one tablet by mouth as needed for palpitations.  May take up to 2 tablets in 24 hours.  Labwork: None ordered.  Testing/Procedures: None ordered.  Follow-Up: Your physician wants you to follow-up in: one year with Dr. Lovena Le.   You will receive a reminder letter in the mail two months in advance. If you don't receive a letter, please call our office to schedule the follow-up appointment.  Any Other Special Instructions Will Be Listed Below (If Applicable).  If you need a refill on your cardiac medications before your next appointment, please call your pharmacy.

## 2020-07-24 ENCOUNTER — Other Ambulatory Visit: Payer: Self-pay | Admitting: Obstetrics & Gynecology

## 2020-08-29 ENCOUNTER — Other Ambulatory Visit: Payer: Self-pay

## 2020-08-29 ENCOUNTER — Ambulatory Visit (INDEPENDENT_AMBULATORY_CARE_PROVIDER_SITE_OTHER): Payer: Self-pay | Admitting: Student

## 2020-08-29 ENCOUNTER — Encounter: Payer: Self-pay | Admitting: Student

## 2020-08-29 VITALS — BP 149/88 | HR 82 | Ht 59.0 in | Wt 156.6 lb

## 2020-08-29 DIAGNOSIS — F419 Anxiety disorder, unspecified: Secondary | ICD-10-CM

## 2020-08-29 DIAGNOSIS — I10 Essential (primary) hypertension: Secondary | ICD-10-CM

## 2020-08-29 DIAGNOSIS — Z01419 Encounter for gynecological examination (general) (routine) without abnormal findings: Secondary | ICD-10-CM

## 2020-08-29 MED ORDER — VALACYCLOVIR HCL 500 MG PO TABS: 500.0000 mg | ORAL_TABLET | Freq: Every day | ORAL | 1 refills | Status: DC

## 2020-08-29 NOTE — Progress Notes (Signed)
Pt has + PHQ-9 today but pt denies [facility] seen today, but ok with referral. Referral placed.  Pt also needs PCP, referral placed. Pt aware of both referrals.

## 2020-08-29 NOTE — Progress Notes (Signed)
  History:  [name] is a 55 y.o. [id number] who presents to clinic today for annual visit. She does not want any STi testing. 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 She denies any other complaints. She had a total hysterectomy in 2015; does not need pap smear. She also needs referral to primary care for her hypertension.   Patient is very tearful about her HSV-2 status. States that her new partner doesn't know and she is afraid to tell him.   The following portions of the patient's history were reviewed and updated as appropriate: allergies, current medications, family history, past medical history, social history, past surgical history and problem list.  Review of Systems:  Review of Systems  Constitutional: Negative.   HENT: Negative.   Respiratory: Negative.   Cardiovascular: Negative.   Gastrointestinal: Negative.   Genitourinary: Negative.   Musculoskeletal: Negative.   Skin: Negative.       Objective:  Physical Exam BP (!) 149/88   Pulse 82   Ht 4\' 11"  (1.499 m)   Wt 156 lb 9.6 oz (71 kg)   LMP 03/15/2014 (Exact Date)   BMI 31.63 kg/m  Physical Exam Constitutional:      Appearance: Normal appearance.  Cardiovascular:     Rate and Rhythm: Normal rate.  Pulmonary:     Effort: Pulmonary effort is normal.  Abdominal:     General: Abdomen is flat.  Genitourinary:    General: Normal vulva.     Vagina: No vaginal discharge.  Musculoskeletal:     Cervical back: Normal range of motion.  Neurological:     Mental Status: She is alert.     Breast exam benign; implants noted. No pain, no puckering, no masses or lumps.   Labs and Imaging No results found for this or any previous visit (from the past 24 hour(s)).  No results found.   Assessment & Plan:   1. Encounter for annual routine gynecological examination   2. Will send Valtrex to pharmacy; reassurance and support given to  patient about her HSV status, information discussed on HSV, methods of transmission, prophylaxis treatment and talking with partner.  3. Approximately 30 minutes of total time was spent with this patient on counseling and coordination.  -no pap due to patient has no cervix.  -mammo ordered; bilateral implants noted in order.  -referral to PCP for elevated blood pressures -referral made for [facility] due to elevated depression scores  Starr Lake, CNM 08/29/2020 9:24 AM

## 2020-09-04 DIAGNOSIS — Z789 Other specified health status: Secondary | ICD-10-CM | POA: Diagnosis not present

## 2020-09-04 DIAGNOSIS — N951 Menopausal and female climacteric states: Secondary | ICD-10-CM | POA: Diagnosis not present

## 2020-09-04 DIAGNOSIS — R635 Abnormal weight gain: Secondary | ICD-10-CM | POA: Diagnosis not present

## 2020-09-04 DIAGNOSIS — R7309 Other abnormal glucose: Secondary | ICD-10-CM | POA: Diagnosis not present

## 2020-09-04 DIAGNOSIS — E559 Vitamin D deficiency, unspecified: Secondary | ICD-10-CM | POA: Diagnosis not present

## 2020-09-09 NOTE — BH Specialist Note (Signed)
Integrated Behavioral Health via Telemedicine Visit  09/09/2020 MATEA STANARD [id number]  Number of [place] visits: 1 Session Start time: 9:36  Session End time: 10:23 Total time: 21  Referring Provider: Maye Hides, CNM Patient/Family location: Home [facility] Provider location: [facility] [place] [facility]  All persons participating in visit: Patient [name] and [place]   Types of Service: Telephone visit  I connected with Jerline Pain and/or [place] by Telephone  (Video is Caregility application) and verified that I am speaking with the correct person using two identifiers.Discussed confidentiality: Yes   I discussed the limitations of telemedicine and the availability of in person appointments.  Discussed there is a possibility of technology failure and discussed alternative modes of communication if that failure occurs.  I discussed that engaging in this telemedicine visit, they consent to the provision of behavioral healthcare and the services will be billed under their insurance.  Patient and/or legal guardian expressed understanding and consented to Telemedicine visit: Yes   Presenting Concerns: Patient and/or family reports the following symptoms/concerns: Pt states her primary concern today is feeling fatigue, "a little down" and anxious over recent HSV2 diagnosis, along with grieving loss of her mother two years ago and father four months ago. Pt has noticed outbreaks only with increased stress; open to learning additional self-coping strategy to help manage stress.  Duration of problem: Increase since diagnosis; Severity of problem: moderate  Patient and/or Family's Strengths/Protective Factors: Sense of purpose  Goals Addressed: Patient will: 1.  Reduce symptoms of: anxiety, depression and stress  2.  Increase knowledge and/or ability of: self-management skills   3.  Demonstrate ability to: Increase healthy adjustment to current life circumstances and Continue healthy grieving over losses  Progress towards Goals: Ongoing  Interventions: Interventions utilized:  Mindfulness or Psychologist, educational and Psychoeducation and/or Health Education Standardized Assessments completed: Not Needed  Patient and/or Family Response: Pt agrees to treatment plan; prefers no MyChart  Assessment: Patient currently experiencing Adjustment disorder with mixed anxious and depressed mood.   Patient may benefit from psychoeducation and brief therapeutic interventions regarding coping with symptoms of depression, anxiety, grief .  Plan: 1. Follow up with behavioral health clinician on : Call Jamie at [phone] as needed 2. Behavioral recommendations:  -CALM relaxation breathing exercise as often as needed throughout the day -Consider using sleep sounds on YouTube at night, for improved sleep (ask children to help with this) -Consider [facility] for additional grief support (example: group grief support) -Consider open communication about diagnosis with boyfriend 3. Referral(s): [place] (In Clinic)  I discussed the assessment and treatment plan with the patient and/or parent/guardian. They were provided an opportunity to ask questions and all were answered. They agreed with the plan and demonstrated an understanding of the instructions.   They were advised to call back or seek an in-person evaluation if the symptoms worsen or if the condition fails to improve as anticipated.  Garlan Fair, LCSW   Depression screen [facility] 2/9 08/29/2020 03/15/2018 09/29/2016  Decreased Interest 1 0 0  Down, Depressed, Hopeless 2 0 0  PHQ - 2 Score 3 0 0  Altered sleeping 3 2 0  Tired, decreased energy 3 1 2   Change in appetite 3 1 0  Feeling bad or failure about yourself  1 0 0  Trouble concentrating 3 0 2  Moving slowly or fidgety/restless 1  0 0  Suicidal thoughts 0 0  0  PHQ-9 Score 17 4 4    GAD 7 : Generalized Anxiety Score 03/15/2018 09/29/2016  Nervous, Anxious, on Edge 0 0  Control/stop worrying 0 0  Worry too much - different things 2 0  Trouble relaxing 1 0  Restless 0 0  Easily annoyed or irritable 0 0  Afraid - awful might happen 0 0  Total GAD 7 Score 3 0

## 2020-09-11 DIAGNOSIS — Z6831 Body mass index (BMI) 31.0-31.9, adult: Secondary | ICD-10-CM | POA: Diagnosis not present

## 2020-09-11 DIAGNOSIS — Z1339 Encounter for screening examination for other mental health and behavioral disorders: Secondary | ICD-10-CM | POA: Diagnosis not present

## 2020-09-11 DIAGNOSIS — N951 Menopausal and female climacteric states: Secondary | ICD-10-CM | POA: Diagnosis not present

## 2020-09-11 DIAGNOSIS — F32A Depression, unspecified: Secondary | ICD-10-CM | POA: Diagnosis not present

## 2020-09-11 DIAGNOSIS — R635 Abnormal weight gain: Secondary | ICD-10-CM | POA: Diagnosis not present

## 2020-09-11 DIAGNOSIS — Z1331 Encounter for screening for depression: Secondary | ICD-10-CM | POA: Diagnosis not present

## 2020-09-18 DIAGNOSIS — Z6831 Body mass index (BMI) 31.0-31.9, adult: Secondary | ICD-10-CM | POA: Diagnosis not present

## 2020-09-18 DIAGNOSIS — R7309 Other abnormal glucose: Secondary | ICD-10-CM | POA: Diagnosis not present

## 2020-09-20 ENCOUNTER — Ambulatory Visit (INDEPENDENT_AMBULATORY_CARE_PROVIDER_SITE_OTHER): Payer: Self-pay | Admitting: Clinical

## 2020-09-20 DIAGNOSIS — F4323 Adjustment disorder with mixed anxiety and depressed mood: Secondary | ICD-10-CM

## 2020-09-20 DIAGNOSIS — F4321 Adjustment disorder with depressed mood: Secondary | ICD-10-CM

## 2020-10-17 DIAGNOSIS — R7309 Other abnormal glucose: Secondary | ICD-10-CM | POA: Diagnosis not present

## 2020-10-17 DIAGNOSIS — Z6831 Body mass index (BMI) 31.0-31.9, adult: Secondary | ICD-10-CM | POA: Diagnosis not present

## 2020-10-17 DIAGNOSIS — Z789 Other specified health status: Secondary | ICD-10-CM | POA: Diagnosis not present

## 2020-10-22 ENCOUNTER — Other Ambulatory Visit: Payer: Self-pay | Admitting: Student

## 2020-10-22 DIAGNOSIS — Z01419 Encounter for gynecological examination (general) (routine) without abnormal findings: Secondary | ICD-10-CM

## 2020-10-23 DIAGNOSIS — E559 Vitamin D deficiency, unspecified: Secondary | ICD-10-CM | POA: Diagnosis not present

## 2020-10-23 DIAGNOSIS — R7309 Other abnormal glucose: Secondary | ICD-10-CM | POA: Diagnosis not present

## 2020-10-23 DIAGNOSIS — N951 Menopausal and female climacteric states: Secondary | ICD-10-CM | POA: Diagnosis not present

## 2020-10-23 DIAGNOSIS — Z6831 Body mass index (BMI) 31.0-31.9, adult: Secondary | ICD-10-CM | POA: Diagnosis not present

## 2020-10-30 DIAGNOSIS — N951 Menopausal and female climacteric states: Secondary | ICD-10-CM | POA: Diagnosis not present

## 2020-10-30 DIAGNOSIS — Z6831 Body mass index (BMI) 31.0-31.9, adult: Secondary | ICD-10-CM | POA: Diagnosis not present

## 2020-10-30 DIAGNOSIS — R7309 Other abnormal glucose: Secondary | ICD-10-CM | POA: Diagnosis not present

## 2020-11-01 ENCOUNTER — Telehealth: Payer: Self-pay | Admitting: Nurse Practitioner

## 2020-11-01 ENCOUNTER — Ambulatory Visit (INDEPENDENT_AMBULATORY_CARE_PROVIDER_SITE_OTHER): Payer: Self-pay | Admitting: Nurse Practitioner

## 2020-11-01 VITALS — BP 135/66 | HR 68 | Temp 97.9°F | Resp 18

## 2020-11-01 DIAGNOSIS — I10 Essential (primary) hypertension: Secondary | ICD-10-CM

## 2020-11-01 MED ORDER — METOPROLOL SUCCINATE ER 25 MG PO TB24: 25.0000 mg | ORAL_TABLET | Freq: Every day | ORAL | 0 refills | Status: DC

## 2020-11-01 MED ORDER — METOPROLOL TARTRATE 25 MG PO TABS: 25.0000 mg | ORAL_TABLET | Freq: Two times a day (BID) | ORAL | 3 refills | Status: DC | PRN

## 2020-11-01 NOTE — Assessment & Plan Note (Signed)
Hypertension Atrial Tachycardia:  Continue metoprolol tartrate 25 mg-take one tablet by mouth as needed for palpitations.  May take up to 2 tablets in 24 hours per cardiology previous prescription  Headache:  May take magnesium 600 mg daily  May start zyrtec for allergies  Stay well hydrated     - will schedule appt for patient to establish with new PCP

## 2020-11-01 NOTE — Patient Instructions (Addendum)
Hypertension Atrial Tachycardia:  Continue metoprolol tartrate 25 mg-take one tablet by mouth as needed for palpitations.  May take up to 2 tablets in 24 hours per cardiology previous prescription  Headache:  May take magnesium 600 mg daily  May start zyrtec for allergies  Stay well hydrated    General Headache Without Cause A headache is pain or discomfort that is felt around the head or neck area. There are many causes and types of headaches. In some cases, the cause may not be found. Follow these instructions at home: Watch your condition for any changes. Let your doctor know about them. Take these steps to help with your condition: Managing pain  Take over-the-counter and prescription medicines only as told by your doctor.  Lie down in a dark, quiet room when you have a headache.  If told, put ice on your head and neck area: ? Put ice in a plastic bag. ? Place a towel between your skin and the bag. ? Leave the ice on for 20 minutes, 2-3 times per day.  If told, put heat on the affected area. Use the heat source that your doctor recommends, such as a moist heat pack or a heating pad. ? Place a towel between your skin and the heat source. ? Leave the heat on for 20-30 minutes. ? Remove the heat if your skin turns bright red. This is very important if you are unable to feel pain, heat, or cold. You may have a greater risk of getting burned.  Keep lights dim if bright lights bother you or make your headaches worse.      Eating and drinking  Eat meals on a regular schedule.  If you drink alcohol: ? Limit how much you use to:  0-1 drink a day for women.  0-2 drinks a day for men. ? Be aware of how much alcohol is in your drink. In the [place] one drink equals one 12 oz bottle of beer (355 mL), one 5 oz glass of wine (148 mL), or one 1 oz glass of hard liquor (44 mL).  Stop drinking caffeine, or reduce how much caffeine you drink. General instructions  Keep a journal  to find out if certain things bring on headaches. For example, write down: ? What you eat and drink. ? How much sleep you get. ? Any change to your diet or medicines.  Get a massage or try other ways to relax.  Limit stress.  Sit up straight. Do not tighten (tense) your muscles.  Do not use any products that contain nicotine or tobacco. This includes cigarettes, e-cigarettes, and chewing tobacco. If you need help quitting, ask your doctor.  Exercise regularly as told by your doctor.  Get enough sleep. This often means 7-9 hours of sleep each night.  Keep all follow-up visits as told by your doctor. This is important.   Contact a doctor if:  Your symptoms are not helped by medicine.  You have a headache that feels different than the other headaches.  You feel sick to your stomach (nauseous) or you throw up (vomit).  You have a fever. Get help right away if:  Your headache gets very bad quickly.  Your headache gets worse after a lot of physical activity.  You keep throwing up.  You have a stiff neck.  You have trouble seeing.  You have trouble speaking.  You have pain in the eye or ear.  Your muscles are weak or you lose muscle control.  You lose your balance or have trouble walking.  You feel like you will pass out (faint) or you pass out.  You are mixed up (confused).  You have a seizure. Summary  A headache is pain or discomfort that is felt around the head or neck area.  There are many causes and types of headaches. In some cases, the cause may not be found.  Keep a journal to help find out what causes your headaches. Watch your condition for any changes. Let your doctor know about them.  Contact a doctor if you have a headache that is different from usual, or if your headache is not helped by medicine.  Get help right away if your headache gets very bad, you throw up, you have trouble seeing, you lose your balance, or you have a seizure. This  information is not intended to replace advice given to you by your health care provider. Make sure you discuss any questions you have with your health care provider. Document Revised: 01/31/2018 Document Reviewed: 01/31/2018 Elsevier Patient Education  2021 [employer].    Hypertension, Adult Hypertension is another name for high blood pressure. High blood pressure forces your heart to work harder to pump blood. This can cause problems over time. There are two numbers in a blood pressure reading. There is a top number (systolic) over a bottom number (diastolic). It is best to have a blood pressure that is below 120/80. Healthy choices can help lower your blood pressure, or you may need medicine to help lower it. What are the causes? The cause of this condition is not known. Some conditions may be related to high blood pressure. What increases the risk?  Smoking.  Having type 2 diabetes mellitus, high cholesterol, or both.  Not getting enough exercise or physical activity.  Being overweight.  Having too much fat, sugar, calories, or salt (sodium) in your diet.  Drinking too much alcohol.  Having long-term (chronic) kidney disease.  Having a family history of high blood pressure.  Age. Risk increases with age.  Race. You may be at higher risk if you are African American.  Gender. Men are at higher risk than women before age 31. After age 72, women are at higher risk than men.  Having obstructive sleep apnea.  Stress. What are the signs or symptoms?  High blood pressure may not cause symptoms. Very high blood pressure (hypertensive crisis) may cause: ? Headache. ? Feelings of worry or nervousness (anxiety). ? Shortness of breath. ? Nosebleed. ? A feeling of being sick to your stomach (nausea). ? Throwing up (vomiting). ? Changes in how you see. ? Very bad chest pain. ? Seizures. How is this treated?  This condition is treated by making healthy lifestyle changes,  such as: ? Eating healthy foods. ? Exercising more. ? Drinking less alcohol.  Your health care provider may prescribe medicine if lifestyle changes are not enough to get your blood pressure under control, and if: ? Your top number is above 130. ? Your bottom number is above 80.  Your personal target blood pressure may vary. Follow these instructions at home: Eating and drinking  If told, follow the DASH eating plan. To follow this plan: ? Fill one half of your plate at each meal with fruits and vegetables. ? Fill one fourth of your plate at each meal with whole grains. Whole grains include whole-wheat pasta, brown rice, and whole-grain bread. ? Eat or drink low-fat dairy products, such as skim milk or low-fat  yogurt. ? Fill one fourth of your plate at each meal with low-fat (lean) proteins. Low-fat proteins include fish, chicken without skin, eggs, beans, and tofu. ? Avoid fatty meat, cured and processed meat, or chicken with skin. ? Avoid pre-made or processed food.  Eat less than 1,500 mg of salt each day.  Do not drink alcohol if: ? Your doctor tells you not to drink. ? You are pregnant, may be pregnant, or are planning to become pregnant.  If you drink alcohol: ? Limit how much you use to:  0-1 drink a day for women.  0-2 drinks a day for men. ? Be aware of how much alcohol is in your drink. In the [place] one drink equals one 12 oz bottle of beer (355 mL), one 5 oz glass of wine (148 mL), or one 1 oz glass of hard liquor (44 mL).   Lifestyle  Work with your doctor to stay at a healthy weight or to lose weight. Ask your doctor what the best weight is for you.  Get at least 30 minutes of exercise most days of the week. This may include walking, swimming, or biking.  Get at least 30 minutes of exercise that strengthens your muscles (resistance exercise) at least 3 days a week. This may include lifting weights or doing Pilates.  Do not use any products that contain nicotine  or tobacco, such as cigarettes, e-cigarettes, and chewing tobacco. If you need help quitting, ask your doctor.  Check your blood pressure at home as told by your doctor.  Keep all follow-up visits as told by your doctor. This is important.   Medicines  Take over-the-counter and prescription medicines only as told by your doctor. Follow directions carefully.  Do not skip doses of blood pressure medicine. The medicine does not work as well if you skip doses. Skipping doses also puts you at risk for problems.  Ask your doctor about side effects or reactions to medicines that you should watch for. Contact a doctor if you:  Think you are having a reaction to the medicine you are taking.  Have headaches that keep coming back (recurring).  Feel dizzy.  Have swelling in your ankles.  Have trouble with your vision. Get help right away if you:  Get a very bad headache.  Start to feel mixed up (confused).  Feel weak or numb.  Feel faint.  Have very bad pain in your: ? Chest. ? Belly (abdomen).  Throw up more than once.  Have trouble breathing. Summary  Hypertension is another name for high blood pressure.  High blood pressure forces your heart to work harder to pump blood.  For most people, a normal blood pressure is less than 120/80.  Making healthy choices can help lower blood pressure. If your blood pressure does not get lower with healthy choices, you may need to take medicine. This information is not intended to replace advice given to you by your health care provider. Make sure you discuss any questions you have with your health care provider. Document Revised: 03/23/2018 Document Reviewed: 03/23/2018 Elsevier Patient Education  2021 [employer].

## 2020-11-01 NOTE — Progress Notes (Signed)
@Patient  ID: [name], female    DOB: 1966/02/21, 55 y.o.   MRN: [id number]  Chief Complaint  Patient presents with  . Hypertension    Headache, history of hypertension     Referring provider: No ref. provider found   Current Outpatient Medications  Medication Sig Dispense Refill  . metoprolol succinate (TOPROL-XL) 25 MG 24 hr tablet Take 1 tablet (25 mg total) by mouth daily. 30 tablet 0  . valACYclovir (VALTREX) 500 MG tablet Take 1 tablet (500 mg total) by mouth daily. 90 tablet 1      HPI     Hypertension ROS: taking medications as instructed, no medication side effects noted, patient does not perform home BP monitoring, no TIA's, no chest [name] on exertion, no dyspnea on exertion and no swelling of ankles. Patient does have a history of NS atrial tachy - we discussed her prescription through cardiology. New concerns: Headache - was referred by OB/GYN because BP was  148/88 at last office visit     No Known Allergies   There is no immunization history on file for this patient.  Past Medical History:  Diagnosis Date  . ALCOHOL USE 11/13/2008   Qualifier: Diagnosis of  By: Darrick Meigs    . 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 Chest [name]   . CHEST [name] 11/13/2008   Qualifier: Diagnosis of  By: Darrick Meigs    . Chronic anemia   . Difficulty sleeping   . Dysmenorrhea 2011  . Dysrhythmia    irregular heart beats  . Fibroids 09/20/2013  . Heart palpitations   . History of abdominal hysterectomy 03/2014  . History of uterine fibroid   . Menorrhagia   . Palpitations 09/23/2006   Qualifier: Diagnosis of  By: Benna Dunks    . Shortness of breath    "associated w/high heart rate" (01/04/2013)  . SUPRAVENTRICULAR TACHYCARDIA 11/13/2008   Qualifier: Diagnosis of  By: Darrick Meigs    . Supraventricular tachycardia ([place])     Tobacco History: Social History   Tobacco Use  Smoking Status Former Smoker  . Packs/day: 0.33  . Years: 3.00  . Pack  years: 0.99  . Types: Cigarettes  . Quit date: 12/27/1984  . Years since quitting: 35.8  Smokeless Tobacco Never Used   Counseling given: Not Answered   Outpatient Encounter Medications as of 11/01/2020  Medication Sig  . metoprolol tartrate (LOPRESSOR) 25 MG tablet Take 1 tablet (25 mg total) by mouth 2 (two) times daily as needed. Take one tablet by mouth as needed for palpitations.  May take up to 2 tablets in 24 hours.  . valACYclovir (VALTREX) 500 MG tablet Take 1 tablet (500 mg total) by mouth daily.  . [DISCONTINUED] metoprolol succinate (TOPROL-XL) 25 MG 24 hr tablet Take 1 tablet (25 mg total) by mouth daily.  . [DISCONTINUED] metoprolol succinate (TOPROL-XL) 25 MG 24 hr tablet Take 1 tablet (25 mg total) by mouth daily.   No facility-administered encounter medications on file as of 11/01/2020.     Review of Systems  Review of Systems  Constitutional: Negative.  Negative for fatigue and fever.  HENT: Negative.   Respiratory: Negative for cough and shortness of breath.   Cardiovascular: Negative.  Negative for chest [name], palpitations and leg swelling.  Gastrointestinal: Negative.   Allergic/Immunologic: Negative.   Neurological: Positive for headaches. Negative for weakness.  Psychiatric/Behavioral: Negative.        Physical Exam  LMP 03/15/2014 (Exact Date)  Wt Readings from Last 5 Encounters:  08/29/20 156 lb 9.6 oz (71 kg)  11/17/19 150 lb (68 kg)  07/07/19 150 lb 9.6 oz (68.3 kg)  04/11/19 143 lb (64.9 kg)  03/15/18 145 lb 4.8 oz (65.9 kg)     Physical Exam Vitals and nursing note reviewed.  Constitutional:      General: She is not in acute distress.    Appearance: She is well-developed.  Cardiovascular:     Rate and Rhythm: Normal rate and regular rhythm.  Pulmonary:     Effort: Pulmonary effort is normal.     Breath sounds: Normal breath sounds.  Musculoskeletal:     Right lower leg: No edema.     Left lower leg: No edema.  Neurological:      Mental Status: She is alert and oriented to person, place, and time.  Psychiatric:        Mood and Affect: Mood normal.        Behavior: Behavior normal.        Assessment & Plan:   Hypertension Hypertension Atrial Tachycardia:  Continue metoprolol tartrate 25 mg-take one tablet by mouth as needed for palpitations.  May take up to 2 tablets in 24 hours per cardiology previous prescription  Headache:  May take magnesium 600 mg daily  May start zyrtec for allergies  Stay well hydrated     - will schedule appt for patient to establish with new PCP     Fenton Foy, NP 11/01/2020

## 2020-11-01 NOTE — Telephone Encounter (Signed)
Called and advised patient that her current prescription on file is long acting metoprolol. This should only be taken once daily. At her last visit with cardiology it was noted that she was told to take 2 if needed PRN for palpitations.   I have sent in a prescription for short acting metoprolol if she feels that she needs the medication more than once per day, but patient states that she has not needed to take medication more than once per day. She will keep her prescription for long acting metoprolol and only take once per day until next appointment with cardiology.

## 2020-11-23 ENCOUNTER — Other Ambulatory Visit: Payer: Self-pay | Admitting: Nurse Practitioner

## 2020-11-28 ENCOUNTER — Ambulatory Visit: Payer: Self-pay | Admitting: Nurse Practitioner

## 2020-11-29 ENCOUNTER — Ambulatory Visit (INDEPENDENT_AMBULATORY_CARE_PROVIDER_SITE_OTHER): Payer: BC Managed Care – PPO | Admitting: Internal Medicine

## 2020-11-29 ENCOUNTER — Other Ambulatory Visit: Payer: Self-pay | Admitting: Student

## 2020-11-29 ENCOUNTER — Encounter (INDEPENDENT_AMBULATORY_CARE_PROVIDER_SITE_OTHER): Payer: Self-pay

## 2020-11-29 ENCOUNTER — Encounter: Payer: Self-pay | Admitting: Internal Medicine

## 2020-11-29 ENCOUNTER — Other Ambulatory Visit: Payer: Self-pay

## 2020-11-29 VITALS — BP 140/82 | HR 63 | Ht 59.0 in | Wt 153.0 lb

## 2020-11-29 DIAGNOSIS — R002 Palpitations: Secondary | ICD-10-CM

## 2020-11-29 DIAGNOSIS — I471 Supraventricular tachycardia: Secondary | ICD-10-CM

## 2020-11-29 DIAGNOSIS — N63 Unspecified lump in unspecified breast: Secondary | ICD-10-CM

## 2020-11-29 MED ORDER — METOPROLOL TARTRATE 25 MG PO TABS: 25.0000 mg | ORAL_TABLET | Freq: Two times a day (BID) | ORAL | 3 refills | Status: DC | PRN

## 2020-11-29 MED ORDER — METOPROLOL SUCCINATE ER 25 MG PO TB24: 25.0000 mg | ORAL_TABLET | Freq: Every day | ORAL | 3 refills | Status: DC

## 2020-11-29 NOTE — Progress Notes (Signed)
HPI [name] returns today for followup of palpitations. She has a h/o SVT and underwent remote ablation. 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 She has been taking long acting toprol. She notes occasional peripheral edema.  No Known Allergies   Current Outpatient Medications  Medication Sig Dispense Refill  . metoprolol succinate (TOPROL-XL) 25 MG 24 hr tablet Take 1 tablet by mouth as needed for heart rate control.     No current facility-administered medications for this visit.     Past Medical History:  Diagnosis Date  . ALCOHOL USE 11/13/2008   Qualifier: Diagnosis of  By: [name]    . 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 Chest pain   . CHEST PAIN 11/13/2008   Qualifier: Diagnosis of  By: [name]    . Chronic anemia   . Difficulty sleeping   . Dysmenorrhea 2011  . Dysrhythmia    irregular heart beats  . Fibroids 09/20/2013  . Heart palpitations   . History of abdominal hysterectomy 03/2014  . History of uterine fibroid   . Menorrhagia   . Palpitations 09/23/2006   Qualifier: Diagnosis of  By: [name]    . Shortness of breath    "associated w/high heart rate" (01/04/2013)  . SUPRAVENTRICULAR TACHYCARDIA 11/13/2008   Qualifier: Diagnosis of  By: [name]    . Supraventricular tachycardia ([place])     ROS:   All systems reviewed and negative except as noted in the HPI.   Past Surgical History:  Procedure Laterality Date  . ABDOMINAL HYSTERECTOMY    . AUGMENTATION MAMMAPLASTY Bilateral   . CARDIAC ELECTROPHYSIOLOGY MAPPING AND ABLATION  ~ 2008; 01/04/2013   "weren't able to do the ablation" (01/04/2013)  . COLONOSCOPY  12/12/2013  . COMBINED AUGMENTATION MAMMAPLASTY AND ABDOMINOPLASTY Bilateral 2002  . ELECTROPHYSIOLOGY STUDY N/A 01/04/2013   Procedure: ELECTROPHYSIOLOGY STUDY;  Surgeon: Robin Lance, MD;  Location: [facility] CATH LAB;  Service: Cardiovascular;  Laterality: N/A;  . SUPRAVENTRICULAR  TACHYCARDIA ABLATION N/A 01/04/2013   Procedure: SUPRAVENTRICULAR TACHYCARDIA ABLATION;  Surgeon: Robin Lance, MD;  Location: [facility] CATH LAB;  Service: Cardiovascular;  Laterality: N/A;  . TUBAL LIGATION    . UTERINE FIBROID SURGERY  ?09/2012  . VAGINAL HYSTERECTOMY Bilateral 03/27/2014   Procedure: HYSTERECTOMY VAGINAL WITH BILATERAL SALPINGECTOMY;  Surgeon: Robin Drafts, MD;  Location: [facility];  Service: Gynecology;  Laterality: Bilateral;     Family History  Problem Relation Age of Onset  . Cancer Brother   . Cancer Paternal Aunt        x 2 unkown type  . Heart disease Maternal Grandmother   . Heart Problems Mother        apid heartbeat  . Colon cancer Neg Hx      Social History   Socioeconomic History  . Marital status: Legally Separated    Spouse name: Not on file  . Number of children: 4  . Years of education: Not on file  . Highest education level: Not on file  Occupational History  . Occupation: order IT consultant: [employer]  Tobacco Use  . Smoking status: Former Smoker    Packs/day: 0.33    Years: 3.00    Pack years: 0.99    Types: Cigarettes    Quit date: 12/27/1984    Years since quitting: 35.9  . Smokeless tobacco: Never Used  Vaping Use  . Vaping Use: Never used  Substance and Sexual Activity  . Alcohol use: Not Currently  . Drug use: Not Currently    Types: Marijuana    Comment: last day used 2 weeks   . Sexual activity: Yes    Birth control/protection: None  Other Topics Concern  . Not on file  Social History Narrative  . Not on file   Social Determinants of Health   Financial Resource Strain: Not on file  Food Insecurity: Not on file  Transportation Needs: Not on file  Physical Activity: Not on file  Stress: Not on file  Social Connections: Not on file  Intimate Partner Violence: Not on file     BP 140/82   Pulse 63   Ht 4\' 11"  (1.499 m)   Wt 153 lb (69.4 kg)   LMP 03/15/2014 (Exact Date)   SpO2 93%   BMI 30.90  kg/m   Physical Exam:  Well appearing middle aged woman,  NAD HEENT: Unremarkable Neck:  No JVD, no thyromegally Lymphatics:  No adenopathy Back:  No CVA tenderness Lungs:  Clear HEART:  Regular rate rhythm, no murmurs, no rubs, no clicks Abd:  soft, positive bowel sounds, no organomegally, no rebound, no guarding Ext:  2 plus pulses, no edema, no cyanosis, no clubbing Skin:  No rashes no nodules Neuro:  CN II through XII intact, motor grossly intact  EKG  - NSR  Assess/Plan: 1. Palpitations - I have recommended that she take the toprol daily and I have given her a new prescription for metoprolol to take for break through symptoms.  2. HTN - her bp is minimally elevated. We will follow.  [name] Robin Nichter,MD

## 2020-11-29 NOTE — Patient Instructions (Addendum)
Medication Instructions:  Your physician has recommended you make the following change in your medication:   1.  TAKE metoprolol succinate (Toprol XL) 25 mg- Take one tablet by mouth daily  2.  Metoprolol tartrate 25 mg-  Take 1-2 tablets by mouth as needed for palpitations  Labwork: None ordered.  Testing/Procedures: None ordered.  Follow-Up: Your physician wants you to follow-up in: one year with Cristopher Peru, MD or one of the following Advanced Practice Providers on your designated Care Team:    Chanetta Marshall, NP  Tommye Standard, PA-C  Legrand Como "Jonni Sanger" Chalmers Cater, [place]   Any Other Special Instructions Will Be Listed Below (If Applicable).  If you need a refill on your cardiac medications before your next appointment, please call your pharmacy.

## 2020-12-04 ENCOUNTER — Ambulatory Visit
Admission: RE | Admit: 2020-12-04 | Discharge: 2020-12-04 | Disposition: A | Discharge status: Home / Self Care | Payer: BC Managed Care – PPO | Source: Ambulatory Visit | Attending: Student | Admitting: Student

## 2020-12-04 ENCOUNTER — Telehealth: Payer: Self-pay

## 2020-12-04 ENCOUNTER — Other Ambulatory Visit: Payer: Self-pay | Admitting: Student

## 2020-12-04 ENCOUNTER — Telehealth: Payer: Self-pay | Admitting: Internal Medicine

## 2020-12-04 ENCOUNTER — Other Ambulatory Visit: Payer: Self-pay

## 2020-12-04 DIAGNOSIS — N63 Unspecified lump in unspecified breast: Secondary | ICD-10-CM

## 2020-12-04 NOTE — Telephone Encounter (Signed)
Pt called from [facility] asking for "Clearance" to have cool sculpting. She needed it now but I advised her that I would need a clearance request faxed to our office. She put the [facility] "nurse" on the phone, [place] [phone]... and she says she does not have any way to fax a request.   The pt will have 5 sessions, 30 min each for the cool sculpting... nothing invasive and it requires contraction of the abdominal muscles. 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   She also reports that there are no expected side effects with the sessions and no MD on staff.   I will need to forward to the preop clearance nurse for review and recommendation. She is asking to have ASAP if not today then by her session planned for this Friday 5/13 but I advised her that I cannot guarantee that she could have it back by then but will go ahead and start to work on it for her. I also cannot guarantee that the provider will approve the request without this being a true medical procedure.

## 2020-12-04 NOTE — Telephone Encounter (Signed)
I will send this to pre op pool for further advice. This is for cool sculpting. No anesthesia is used in this process. Pre op provider please see phone note that came in today.

## 2020-12-04 NOTE — Telephone Encounter (Signed)
Patient is calling and said that she needs a letterhead. Fax is [phone] Attn: [email or web address]. says that they need it asap

## 2020-12-05 NOTE — Telephone Encounter (Signed)
    [name] DOB:  1966/02/22  MRN:  [id number]   Primary Cardiologist: Candee Furbish, MD  Primary Electrophysiologist: Crissie Sickles, MD  Chart reviewed as part of pre-operative protocol coverage. Miss Hochman has a history of SVT and underwent remote ablation. She was last seen by Dr. Lovena Le (electrophysiology) 11/29/20. Given past medical history and time since last visit, based on ACC/AHA guidelines, Carolynn Tuley Bessey would be at acceptable risk for the planned procedure without further cardiovascular testing.   I will route this recommendation to the requesting party via Epic fax function and remove from pre-op pool.  Please call with questions.  Loel Dubonnet, NP 12/05/2020, 9:24 AM

## 2020-12-05 NOTE — Telephone Encounter (Signed)
Patient states they have not received the clearance yet. She states her procedure is tomorrow.

## 2020-12-06 ENCOUNTER — Telehealth: Payer: Self-pay | Admitting: Internal Medicine

## 2020-12-06 ENCOUNTER — Encounter: Payer: Self-pay | Admitting: *Deleted

## 2020-12-06 NOTE — Telephone Encounter (Signed)
[name] is calling back stating she specifically wants Dr. Lovena Le to call her in regards to this, no one else. Please advise.

## 2020-12-06 NOTE — Telephone Encounter (Signed)
Returned call to Pt.  Pt has been cleared for "cool toning" by preop department but she wants specific approval from Dr. Lovena Le.  Advised Dr. Lovena Le not available until next week.    Advised would ask Dr. Lovena Le and get back to Pt.

## 2020-12-06 NOTE — Telephone Encounter (Signed)
Preoperative clearance has been re-faxed to requesting party.  Will remove from preop box.  Loel Dubonnet, NP

## 2020-12-06 NOTE — Telephone Encounter (Signed)
Patient called to see if Dr. Lovena Le okay with patient doing cool toning. Please advise

## 2020-12-06 NOTE — Telephone Encounter (Signed)
Pt walked in the office today to see if she had been cleared for her procedure today at 9 am with [facility]. I explained to that I will review her chart and see if the Pre Op team cardiologist has cleared her and I will call her back.   Pt left the office and will await my call. I reviewed the chart and saw that the pt has been cleared and that our office has re-faxed the clearance this morning as we did fax clearance yesterday as well. I called the pt and she was still in our parking lot. I assured her she has been cleared and she will meet me back up in our lobby as I will print off the clearance for her to take to [facility]. Pt is thankful for our help.

## 2020-12-09 NOTE — Telephone Encounter (Signed)
Patient is following up, requesting to speak with Dr. Lovena Le specifically. I informed her, he is still unavailable, but we will follow up with her as soon as Dr. Lovena Le provides a recommendation.

## 2020-12-09 NOTE — Telephone Encounter (Signed)
PT is calling back again to speak with Dr.Taylor.Advised pt that a phone note has already been sent and the nurse will contact once she is finished with patients.

## 2020-12-10 NOTE — Telephone Encounter (Signed)
Per Dr. Corliss Parish to proceed with cool sculpting.

## 2020-12-10 NOTE — Telephone Encounter (Signed)
Called [facility].  They have received cardiac clearance.  Call placed to Pt.  Advised cardiac clearance had been received by [facility].  No further action needed.

## 2020-12-11 ENCOUNTER — Ambulatory Visit (INDEPENDENT_AMBULATORY_CARE_PROVIDER_SITE_OTHER): Payer: BC Managed Care – PPO | Admitting: Podiatry

## 2020-12-11 ENCOUNTER — Ambulatory Visit (INDEPENDENT_AMBULATORY_CARE_PROVIDER_SITE_OTHER): Payer: BC Managed Care – PPO

## 2020-12-11 ENCOUNTER — Other Ambulatory Visit: Payer: Self-pay

## 2020-12-11 ENCOUNTER — Ambulatory Visit: Payer: 59 | Admitting: Podiatry

## 2020-12-11 ENCOUNTER — Telehealth: Payer: Self-pay | Admitting: Internal Medicine

## 2020-12-11 DIAGNOSIS — M79672 Pain in left foot: Secondary | ICD-10-CM | POA: Diagnosis not present

## 2020-12-11 DIAGNOSIS — M79671 Pain in right foot: Secondary | ICD-10-CM

## 2020-12-11 DIAGNOSIS — B070 Plantar wart: Secondary | ICD-10-CM | POA: Diagnosis not present

## 2020-12-11 MED ORDER — MELOXICAM 15 MG PO TABS: 15.0000 mg | ORAL_TABLET | Freq: Every day | ORAL | 1 refills | Status: DC

## 2020-12-11 NOTE — Telephone Encounter (Signed)
Spoke with patient and she came today at the request of [facility] [phone] as she is having "cool toning"(tones muscles) not "cool sculpting" and they need the note to state okay to have "cool toning" procedure.  I then called [facility] and spoke with receptionist who sent email to clinic to see exactly what they were needing as the patient felt she had given everything needed to proceed.  They are going to call me back on my cell. I let the patient know we would contact her tomorrow once we have exactly what it is she needs.

## 2020-12-11 NOTE — Telephone Encounter (Signed)
[facility] called back and they need the clearance to state it is okay to have cool toning and not cool sculpting as these are different procedures.  The target area is her abdomen and with her having a history of SVT is the reason this is needed.

## 2020-12-11 NOTE — Progress Notes (Signed)
   Subjective: 55 y.o. female presenting today as a new patient for evaluation of a symptomatic skin lesion to the plantar aspect of the left foot has been going on for several months.  It is very symptomatic and painful with shoe gear and ambulation.  She has not done anything for treatment.  She presents for further treatment evaluation   Past Medical History:  Diagnosis Date  . ALCOHOL USE 11/13/2008   Qualifier: Diagnosis of  By: Darrick Meigs    . 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 Chest pain   . CHEST PAIN 11/13/2008   Qualifier: Diagnosis of  By: Darrick Meigs    . Chronic anemia   . Difficulty sleeping   . Dysmenorrhea 2011  . Dysrhythmia    irregular heart beats  . Fibroids 09/20/2013  . Heart palpitations   . History of abdominal hysterectomy 03/2014  . History of uterine fibroid   . Menorrhagia   . Palpitations 09/23/2006   Qualifier: Diagnosis of  By: Benna Dunks    . Shortness of breath    "associated w/high heart rate" (01/04/2013)  . SUPRAVENTRICULAR TACHYCARDIA 11/13/2008   Qualifier: Diagnosis of  By: Darrick Meigs    . Supraventricular tachycardia ([place])     Objective: Physical Exam General: The patient is alert and oriented x3 in no acute distress.   Dermatology: Hyperkeratotic skin lesion(s) noted to the plantar aspect of the left foot approximately 1 cm in diameter. Pinpoint bleeding noted upon debridement. Skin is warm, dry and supple bilateral lower extremities. Negative for open lesions or macerations.   Vascular: Palpable pedal pulses bilaterally. No edema or erythema noted. Capillary refill within normal limits.   Neurological: Epicritic and protective threshold grossly intact bilaterally.    Musculoskeletal Exam: Pain on palpation to the noted skin lesion(s).  Range of motion within normal limits to all pedal and ankle joints bilateral. Muscle strength 5/5 in all groups bilateral.    Assessment: #1 plantar wart left foot #2 generalized  foot pain bilateral   Plan of Care:  #1 Patient was evaluated. #2 Excisional debridement of the plantar wart lesion(s) was performed using a chisel blade. Cantharone was applied and the lesion(s) was dressed with a dry sterile dressing. #3  Patient also stands all throughout the day at work and has generalized foot pain bilateral.  Prescription for meloxicam 15 mg daily as needed  #4 patient is to return to clinic in 2 weeks  Edrick Kins, DPM [facility]  Dr. Edrick Kins, [address]                                        [place], [place] [address]                Office [phone]  Fax [phone]

## 2020-12-11 NOTE — Telephone Encounter (Signed)
Pt. Is in the office today for the third day in a row. She is requesting to get another letter specific from the Dr. About her condition. Please give her a call as soon as possible because she has an upcoming procedure scheduled.

## 2020-12-12 ENCOUNTER — Emergency Department (HOSPITAL_COMMUNITY): Payer: BC Managed Care – PPO

## 2020-12-12 ENCOUNTER — Other Ambulatory Visit: Payer: Self-pay

## 2020-12-12 ENCOUNTER — Emergency Department (HOSPITAL_COMMUNITY)
Admission: EM | Admit: 2020-12-12 | Discharge: 2020-12-12 | Disposition: A | Discharge status: Home / Self Care | Payer: BC Managed Care – PPO | Attending: Emergency Medicine | Admitting: Emergency Medicine

## 2020-12-12 ENCOUNTER — Encounter (HOSPITAL_COMMUNITY): Payer: Self-pay | Admitting: Pharmacy Technician

## 2020-12-12 DIAGNOSIS — R1030 Lower abdominal pain, unspecified: Secondary | ICD-10-CM | POA: Insufficient documentation

## 2020-12-12 DIAGNOSIS — R1010 Upper abdominal pain, unspecified: Secondary | ICD-10-CM | POA: Diagnosis not present

## 2020-12-12 DIAGNOSIS — Z79899 Other long term (current) drug therapy: Secondary | ICD-10-CM | POA: Insufficient documentation

## 2020-12-12 DIAGNOSIS — Z87891 Personal history of nicotine dependence: Secondary | ICD-10-CM | POA: Diagnosis not present

## 2020-12-12 DIAGNOSIS — K921 Melena: Secondary | ICD-10-CM | POA: Diagnosis not present

## 2020-12-12 DIAGNOSIS — R1032 Left lower quadrant pain: Secondary | ICD-10-CM | POA: Diagnosis not present

## 2020-12-12 DIAGNOSIS — R109 Unspecified abdominal pain: Secondary | ICD-10-CM

## 2020-12-12 DIAGNOSIS — I10 Essential (primary) hypertension: Secondary | ICD-10-CM | POA: Insufficient documentation

## 2020-12-12 LAB — URINALYSIS, ROUTINE W REFLEX MICROSCOPIC
Bacteria, UA: NONE SEEN
Bilirubin Urine: NEGATIVE
Glucose, UA: NEGATIVE mg/dL
Hgb urine dipstick: NEGATIVE
Ketones, ur: 5 mg/dL — AB
Nitrite: NEGATIVE
Protein, ur: NEGATIVE mg/dL
Specific Gravity, Urine: 1.034 — ABNORMAL HIGH (ref 1.005–1.030)
pH: 5 (ref 5.0–8.0)

## 2020-12-12 LAB — CBC
HCT: 39.5 % (ref 36.0–46.0)
Hemoglobin: 13.3 g/dL (ref 12.0–15.0)
MCH: 24.4 pg — ABNORMAL LOW (ref 26.0–34.0)
MCHC: 33.7 g/dL (ref 30.0–36.0)
MCV: 72.6 fL — ABNORMAL LOW (ref 80.0–100.0)
Platelets: 294 10*3/uL (ref 150–400)
RBC: 5.44 MIL/uL — ABNORMAL HIGH (ref 3.87–5.11)
RDW: 15.5 % (ref 11.5–15.5)
WBC: 7.5 10*3/uL (ref 4.0–10.5)
nRBC: 0 % (ref 0.0–0.2)

## 2020-12-12 LAB — COMPREHENSIVE METABOLIC PANEL
ALT: 23 U/L (ref 0–44)
AST: 23 U/L (ref 15–41)
Albumin: 3.8 g/dL (ref 3.5–5.0)
Alkaline Phosphatase: 90 U/L (ref 38–126)
Anion gap: 7 (ref 5–15)
BUN: 11 mg/dL (ref 6–20)
CO2: 22 mmol/L (ref 22–32)
Calcium: 9 mg/dL (ref 8.9–10.3)
Chloride: 109 mmol/L (ref 98–111)
Creatinine, Ser: 0.6 mg/dL (ref 0.44–1.00)
GFR, Estimated: >60 mL/min (ref >60)
Glucose, Bld: 123 mg/dL — ABNORMAL HIGH (ref 70–99)
Potassium: 3.8 mmol/L (ref 3.5–5.1)
Sodium: 138 mmol/L (ref 135–145)
Total Bilirubin: 0.8 mg/dL (ref 0.3–1.2)
Total Protein: 7.2 g/dL (ref 6.5–8.1)

## 2020-12-12 LAB — TYPE AND SCREEN
ABO/RH(D): A POS
Antibody Screen: NEGATIVE

## 2020-12-12 LAB — LIPASE, BLOOD: Lipase: 33 U/L (ref 11–51)

## 2020-12-12 MED ORDER — IOHEXOL 9 MG/ML PO SOLN
ORAL | Status: AC
  Filled 2020-12-12: qty 1000

## 2020-12-12 MED ORDER — KETOROLAC TROMETHAMINE 30 MG/ML IJ SOLN
30.0000 mg | Freq: Once | INTRAMUSCULAR | Status: AC
  Administered 2020-12-12: 30 mg via INTRAMUSCULAR
  Filled 2020-12-12: qty 1

## 2020-12-12 MED ORDER — LOPERAMIDE HCL 2 MG PO CAPS: 2.0000 mg | ORAL_CAPSULE | Freq: Four times a day (QID) | ORAL | 0 refills | Status: DC | PRN

## 2020-12-12 MED ORDER — IBUPROFEN 600 MG PO TABS: 600.0000 mg | ORAL_TABLET | Freq: Three times a day (TID) | ORAL | 0 refills | Status: AC | PRN

## 2020-12-12 NOTE — ED Provider Notes (Signed)
[facility] EMERGENCY DEPARTMENT Provider Note   CSN: [id number] Arrival date & time: 12/12/20  [id number]     History Chief Complaint  Patient presents with  . Abdominal Pain    [name] is a 55 y.o. female.  HPI   Patient presents to the ER for evaluation of abdominal pain.  Patient states she has been having issues with abdominal pain at least for the last month.  She did not initially think too much of it and was trying to manage at home.  She has had pain both in her lower and upper abdomen.  She has not had any vomiting or diarrhea.  No urinary symptoms.  Today however she noticed blood in her stool.  This concerned her and she came to the ED.  Patient does have history of prior hysterectomy.  She is not aware of any issues with prior colon problems.  Past Medical History:  Diagnosis Date  . ALCOHOL USE 11/13/2008   Qualifier: Diagnosis of  By: Darrick Meigs    . 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 Chest pain   . CHEST PAIN 11/13/2008   Qualifier: Diagnosis of  By: Darrick Meigs    . Chronic anemia   . Difficulty sleeping   . Dysmenorrhea 2011  . Dysrhythmia    irregular heart beats  . Fibroids 09/20/2013  . Heart palpitations   . History of abdominal hysterectomy 03/2014  . History of uterine fibroid   . Menorrhagia   . Palpitations 09/23/2006   Qualifier: Diagnosis of  By: Benna Dunks    . Shortness of breath    "associated w/high heart rate" (01/04/2013)  . SUPRAVENTRICULAR TACHYCARDIA 11/13/2008   Qualifier: Diagnosis of  By: Darrick Meigs    . Supraventricular tachycardia [facility])     Patient Active Problem List   Diagnosis Date Noted  . Atrial tachycardia ([place]) 11/29/2020  . Hypertension 11/17/2019  . Encounter for annual routine gynecological examination 09/30/2016  . Post-operative state 03/27/2014  . Fibroids 09/20/2013  . Dysmenorrhea 11/17/2012  . ALCOHOL USE 11/13/2008  . SUPRAVENTRICULAR TACHYCARDIA 11/13/2008  .  CHEST PAIN 11/13/2008  . PALPITATIONS 09/23/2006    Past Surgical History:  Procedure Laterality Date  . ABDOMINAL HYSTERECTOMY    . AUGMENTATION MAMMAPLASTY Bilateral   . CARDIAC ELECTROPHYSIOLOGY MAPPING AND ABLATION  ~ 2008; 01/04/2013   "weren't able to do the ablation" (01/04/2013)  . COLONOSCOPY  12/12/2013  . COMBINED AUGMENTATION MAMMAPLASTY AND ABDOMINOPLASTY Bilateral 2002  . ELECTROPHYSIOLOGY STUDY N/A 01/04/2013   Procedure: ELECTROPHYSIOLOGY STUDY;  Surgeon: Evans Lance, MD;  Location: [facility] CATH LAB;  Service: Cardiovascular;  Laterality: N/A;  . SUPRAVENTRICULAR TACHYCARDIA ABLATION N/A 01/04/2013   Procedure: SUPRAVENTRICULAR TACHYCARDIA ABLATION;  Surgeon: Evans Lance, MD;  Location: [facility] CATH LAB;  Service: Cardiovascular;  Laterality: N/A;  . TUBAL LIGATION    . UTERINE FIBROID SURGERY  ?09/2012  . VAGINAL HYSTERECTOMY Bilateral 03/27/2014   Procedure: HYSTERECTOMY VAGINAL WITH BILATERAL SALPINGECTOMY;  Surgeon: Lavonia Drafts, MD;  Location: [facility];  Service: Gynecology;  Laterality: Bilateral;     OB History    Gravida  3   Para  3   Term  3   Preterm  0   AB  0   Living  4     SAB  0   IAB  0   Ectopic  0   Multiple  1   Live Births  Family History  Problem Relation Age of Onset  . Cancer Brother   . Cancer Paternal Aunt        x 2 unkown type  . Breast cancer Paternal Aunt   . Heart disease Maternal Grandmother   . Heart Problems Mother        apid heartbeat  . Breast cancer Sister 59  . Breast cancer Paternal Grandmother   . Colon cancer Neg Hx     Social History   Tobacco Use  . Smoking status: Former Smoker    Packs/day: 0.33    Years: 3.00    Pack years: 0.99    Types: Cigarettes    Quit date: 12/27/1984    Years since quitting: 35.9  . Smokeless tobacco: Never Used  Vaping Use  . Vaping Use: Never used  Substance Use Topics  . Alcohol use: Not Currently  . Drug use: Not Currently    Types:  Marijuana    Comment: last day used 2 weeks     Home Medications Prior to Admission medications   Medication Sig Start Date End Date Taking? Authorizing Provider  ibuprofen (ADVIL) 600 MG tablet Take 1 tablet (600 mg total) by mouth every 8 (eight) hours as needed for up to 7 days for fever, headache, mild pain or moderate pain. 12/12/20 12/19/20 Yes Dorie Rank, MD  loperamide (IMODIUM) 2 MG capsule Take 1 capsule (2 mg total) by mouth 4 (four) times daily as needed for diarrhea or loose stools. 12/12/20  Yes Dorie Rank, MD  meloxicam (MOBIC) 15 MG tablet Take 1 tablet (15 mg total) by mouth daily. 12/11/20   Edrick Kins, DPM  metoprolol succinate (TOPROL XL) 25 MG 24 hr tablet Take 1 tablet (25 mg total) by mouth daily. 11/29/20   Evans Lance, MD  metoprolol tartrate (LOPRESSOR) 25 MG tablet Take 1 tablet (25 mg total) by mouth 2 (two) times daily as needed. Take one tablet by mouth as needed for palpitations.  May take up to 2 tablets in 24 hours. 11/29/20   Evans Lance, MD    Allergies    Patient has no known allergies.  Review of Systems   Review of Systems  All other systems reviewed and are negative.   Physical Exam Updated Vital Signs BP 131/80   Pulse 72   Temp 98.2 F (36.8 C) (Oral)   Resp 16   Ht 1.499 m (4\' 11" )   Wt 68.5 kg   LMP 03/15/2014 (Exact Date)   SpO2 99%   BMI 30.50 kg/m   Physical Exam Vitals and nursing note reviewed.  Constitutional:      General: She is not in acute distress.    Appearance: She is well-developed.  HENT:     Head: Normocephalic and atraumatic.     Right Ear: External ear normal.     Left Ear: External ear normal.  Eyes:     General: No scleral icterus.       Right eye: No discharge.        Left eye: No discharge.     Conjunctiva/sclera: Conjunctivae normal.  Neck:     Trachea: No tracheal deviation.  Cardiovascular:     Rate and Rhythm: Normal rate and regular rhythm.  Pulmonary:     Effort: Pulmonary effort is  normal. No respiratory distress.     Breath sounds: Normal breath sounds. No stridor. No wheezing or rales.  Abdominal:     General: Bowel sounds are normal.  There is no distension.     Palpations: Abdomen is soft.     Tenderness: There is abdominal tenderness in the epigastric area and suprapubic area. There is no guarding or rebound.  Musculoskeletal:        General: No tenderness.     Cervical back: Neck supple.  Skin:    General: Skin is warm and dry.     Findings: No rash.  Neurological:     Mental Status: She is alert.     Cranial Nerves: No cranial nerve deficit (no facial droop, extraocular movements intact, no slurred speech).     Sensory: No sensory deficit.     Motor: No abnormal muscle tone or seizure activity.     Coordination: Coordination normal.     ED Results / Procedures / Treatments   Labs (all labs ordered are listed, but only abnormal results are displayed) Labs Reviewed  COMPREHENSIVE METABOLIC PANEL - Abnormal; Notable for the following components:      Result Value   Glucose, Bld 123 (*)    All other components within normal limits  CBC - Abnormal; Notable for the following components:   RBC 5.44 (*)    MCV 72.6 (*)    MCH 24.4 (*)    All other components within normal limits  URINALYSIS, ROUTINE W REFLEX MICROSCOPIC - Abnormal; Notable for the following components:   APPearance HAZY (*)    Specific Gravity, Urine 1.034 (*)    Ketones, ur 5 (*)    Leukocytes,Ua MODERATE (*)    All other components within normal limits  LIPASE, BLOOD  TYPE AND SCREEN    EKG None  Radiology CT ABDOMEN PELVIS WO CONTRAST  Result Date: 12/12/2020 CLINICAL DATA:  Left lower quadrant pain. EXAM: CT ABDOMEN AND PELVIS WITHOUT CONTRAST TECHNIQUE: Multidetector CT imaging of the abdomen and pelvis was performed following the standard protocol without IV contrast. COMPARISON:  08/02/2017 FINDINGS: Lower chest:   Unremarkable. Hepatobiliary: No focal abnormality in the  liver on this study without intravenous contrast. There is no evidence for gallstones, gallbladder wall thickening, or pericholecystic fluid. No intrahepatic or extrahepatic biliary dilation. Pancreas: No focal mass lesion. No dilatation of the main duct. No intraparenchymal cyst. No peripancreatic edema. Spleen: No splenomegaly. No focal mass lesion. Adrenals/Urinary Tract: No adrenal nodule or mass. Unremarkable noncontrast appearance of the kidneys. No evidence for hydroureter. The urinary bladder appears normal for the degree of distention. Stomach/Bowel: Stomach is unremarkable. No gastric wall thickening. No evidence of outlet obstruction. Duodenum is normally positioned as is the ligament of Treitz. No small bowel wall thickening. No small bowel dilatation. The terminal ileum is normal. The appendix is normal. No gross colonic mass. No colonic wall thickening. Vascular/Lymphatic: No abdominal aortic aneurysm. There is no gastrohepatic or hepatoduodenal ligament lymphadenopathy. No retroperitoneal or mesenteric lymphadenopathy. No pelvic sidewall lymphadenopathy. Reproductive: Uterus surgically absent.  There is no adnexal mass. Other: No intraperitoneal free fluid. Musculoskeletal: No worrisome lytic or sclerotic osseous abnormality. IMPRESSION: No acute findings in the abdomen or pelvis. Specifically, no findings to explain the patient's history of left lower quadrant pain. No substantial diverticular disease in the colon in there is no evidence of diverticulitis. No left adnexal mass. Electronically Signed   By: Misty Stanley M.D.   On: 12/12/2020 13:46   DG Foot Complete Left  Result Date: 12/12/2020 Please see detailed radiograph report in office note.   Procedures Procedures   Medications Ordered in ED Medications  iohexol (OMNIPAQUE) 9 MG/ML  oral solution (has no administration in time range)  ketorolac (TORADOL) 30 MG/ML injection 30 mg (has no administration in time range)    ED Course   I have reviewed the triage vital signs and the nursing notes.  Pertinent labs & imaging results that were available during my care of the patient were reviewed by me and considered in my medical decision making (see chart for details).  Clinical Course as of 12/12/20 1449  Thu Dec 12, 2020  1005 Labs reviewed.  CBC metabolic panel normal.  Lipase normal. [JK]  1005 CT scan performed without IV contrast due to national contrast shortage [JK]    Clinical Course User Index [JK] Dorie Rank, MD   MDM Rules/Calculators/A&P                          Patient presented with complaints of abdominal pain as well as blood in her stool.  Concerned about the possibility of colitis diverticulitis.  Her laboratory tests were unremarkable.  Urinalysis does not suggest ureteral stone or urinary tract infection.  CT scan was performed and there are no acute findings noted on the CT scan.  Noncontrasted CT was performed due to the national shortage of IV contrast.  At this point patient's ED work-up is reassuring.  I do think she would benefit from further outpatient evaluation.  She has noted some blood in her stools although is not having any signs of serious GI bleeding.  She might benefit from colonoscopy.  Patient states she has not had a screening colonoscopy.  I will give referral to gastroenterology. Final Clinical Impression(s) / ED Diagnoses Final diagnoses:  Abdominal pain, unspecified abdominal location    Rx / DC Orders ED Discharge Orders         Ordered    ibuprofen (ADVIL) 600 MG tablet  Every 8 hours PRN        12/12/20 1447    loperamide (IMODIUM) 2 MG capsule  4 times daily PRN        12/12/20 1447           Dorie Rank, MD 12/12/20 1449

## 2020-12-12 NOTE — Discharge Instructions (Addendum)
The test today in the ED including the CT scan did not show any acute abnormality.  Follow-up with the GI doctor for further evaluation of your pain.  Take the medications as prescribed to help with pain as well as loose stools

## 2020-12-12 NOTE — ED Notes (Signed)
Patient transported to CT 

## 2020-12-12 NOTE — Telephone Encounter (Signed)
Attempted to fax clearance letter to [employer] THREE times.  Fax states "no answer"  Will mail letter of clearance to Pt.

## 2020-12-12 NOTE — Telephone Encounter (Signed)
Faxed clearance letter to [employer] approving [employer]  NO further action needed

## 2020-12-12 NOTE — ED Triage Notes (Signed)
Pt here with reports of L sided flank/abd pain for "a while" with worsening yesterday. Pt also reports blood in her stool onset yesterday.

## 2020-12-16 ENCOUNTER — Ambulatory Visit: Payer: BC Managed Care – PPO | Admitting: Podiatry

## 2020-12-25 ENCOUNTER — Other Ambulatory Visit: Payer: Self-pay

## 2020-12-25 ENCOUNTER — Ambulatory Visit (INDEPENDENT_AMBULATORY_CARE_PROVIDER_SITE_OTHER): Payer: BC Managed Care – PPO | Admitting: Podiatry

## 2020-12-25 DIAGNOSIS — B070 Plantar wart: Secondary | ICD-10-CM

## 2020-12-25 NOTE — Progress Notes (Signed)
   Subjective: Patient presents today for follow-up evaluation of a plantar wart to the left lower extremity.  Patient states that she feels much better since last visit.  Patient presents today for follow-up treatment and evaluation   Objective: Physical Exam General: The patient is alert and oriented x3 in no acute distress.   Dermatology: Hyperkeratotic skin lesion noted to the plantar aspect of the left foot approximately 1 cm in diameter that appears loosely adhered to the underlying healthy skin. Skin is warm, dry and supple bilateral lower extremities. Negative for open lesions or macerations.   Vascular: Palpable pedal pulses bilaterally. No edema or erythema noted. Capillary refill within normal limits.   Neurological: Epicritic and protective threshold grossly intact bilaterally.    Musculoskeletal Exam:  Negative for any significant pain on palpation to the noted skin lesion.  Range of motion within normal limits to all pedal and ankle joints bilateral. Muscle strength 5/5 in all groups bilateral.    Assessment: #1 plantar wart left foot-resolved #2 pain in left foot-resolved     Plan of Care:  #1 Patient was evaluated. #2 Excisional debridement of the plantar wart lesion was performed using a chisel blade.  Recommend over-the-counter wart remover x2 weeks #3 return to clinic prn     Edrick Kins, DPM [facility]  Dr. Edrick Kins, DPM    [address],  [address]                Office [phone]  Fax [phone]

## 2021-01-01 ENCOUNTER — Ambulatory Visit: Payer: Self-pay | Admitting: Nurse Practitioner

## 2021-02-07 ENCOUNTER — Ambulatory Visit (INDEPENDENT_AMBULATORY_CARE_PROVIDER_SITE_OTHER): Payer: BC Managed Care – PPO | Admitting: Nurse Practitioner

## 2021-02-07 ENCOUNTER — Other Ambulatory Visit: Payer: Self-pay

## 2021-02-07 ENCOUNTER — Encounter: Payer: Self-pay | Admitting: Nurse Practitioner

## 2021-02-07 VITALS — BP 118/75 | HR 74 | Temp 98.1°F | Ht 59.0 in | Wt 151.1 lb

## 2021-02-07 DIAGNOSIS — Z8719 Personal history of other diseases of the digestive system: Secondary | ICD-10-CM

## 2021-02-07 DIAGNOSIS — Z131 Encounter for screening for diabetes mellitus: Secondary | ICD-10-CM

## 2021-02-07 DIAGNOSIS — Z7689 Persons encountering health services in other specified circumstances: Secondary | ICD-10-CM

## 2021-02-07 DIAGNOSIS — R109 Unspecified abdominal pain: Secondary | ICD-10-CM | POA: Diagnosis not present

## 2021-02-07 LAB — POCT URINALYSIS DIPSTICK
Bilirubin, UA: NEGATIVE
Glucose, UA: NEGATIVE
Ketones, UA: NEGATIVE
Nitrite, UA: NEGATIVE
Protein, UA: NEGATIVE
Spec Grav, UA: >=1.03 — AB (ref 1.010–1.025)
Urobilinogen, UA: 0.2 E.U./dL
pH, UA: 5.5 (ref 5.0–8.0)

## 2021-02-07 LAB — POCT GLYCOSYLATED HEMOGLOBIN (HGB A1C): Hemoglobin A1C: 5.9 % — AB (ref 4.0–5.6)

## 2021-02-07 MED ORDER — VALACYCLOVIR HCL 500 MG PO TABS: 500.0000 mg | ORAL_TABLET | Freq: Every day | ORAL | 3 refills | Status: DC

## 2021-02-07 NOTE — Progress Notes (Signed)
[facility], [place]  [address] Phone:  [phone]   Fax:  [phone]   New Patient Office Visit  Subjective:  Patient ID: STEPHANNIE [name], female    DOB: [birth year]  Age: 55 y.o. MRN: [id number]  CC:  Chief Complaint  Patient presents with   Establish Care    Blood in stool back in May was seen at ED. Has not noticed recently.     HPI [name] presents to establish care. She  has a past medical history of ALCOHOL USE (11/13/2008), ASCUS with positive high risk HPV (02/07/2012), Chest pain, CHEST PAIN (11/13/2008), Chronic anemia, Difficulty sleeping, Dysmenorrhea (2011), Dysrhythmia, Fibroids (09/20/2013), Heart palpitations, History of abdominal hysterectomy (03/2014), History of uterine fibroid, Menorrhagia, Palpitations (09/23/2006), Shortness of breath, SUPRAVENTRICULAR TACHYCARDIA (11/13/2008), and Supraventricular tachycardia ([place]).   She caries for her 2 grandchildren. She works in [employer]. She drives the forklift.   She has a history blood in her stool.  She was seen at the emergency department however her exam was negative.  She denies any recent problems with blood in stool.  She does have nausea regularly.  She denies any vomiting.  She has problems with constipation she does use over-the-counter medications which is effective.  She denies any diarrhea.  She has had a previous colonoscopy which was negative.  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   Chest pain    CHEST PAIN 11/13/2008   Qualifier: Diagnosis of  By: Darrick Meigs     Chronic anemia    Difficulty sleeping    Dysmenorrhea 2011   Dysrhythmia    irregular heart beats   Fibroids 09/20/2013   Heart palpitations    History of abdominal hysterectomy 03/2014   History of uterine fibroid    Menorrhagia    Palpitations 09/23/2006    Qualifier: Diagnosis of  By: Benna Dunks     Shortness of breath    "associated w/high heart rate" (01/04/2013)   SUPRAVENTRICULAR TACHYCARDIA 11/13/2008   Qualifier: Diagnosis of  By: Darrick Meigs     Supraventricular tachycardia [facility])     Past Surgical History:  Procedure Laterality Date   ABDOMINAL HYSTERECTOMY     AUGMENTATION MAMMAPLASTY Bilateral    CARDIAC ELECTROPHYSIOLOGY MAPPING AND ABLATION  ~ 2008; 01/04/2013   "weren't able to do the ablation" (01/04/2013)   COLONOSCOPY  12/12/2013   COMBINED AUGMENTATION MAMMAPLASTY AND ABDOMINOPLASTY Bilateral 2002   ELECTROPHYSIOLOGY STUDY N/A 01/04/2013   Procedure: ELECTROPHYSIOLOGY STUDY;  Surgeon: Evans Lance, MD;  Location: [facility] CATH LAB;  Service: Cardiovascular;  Laterality: N/A;   SUPRAVENTRICULAR TACHYCARDIA ABLATION N/A 01/04/2013   Procedure: SUPRAVENTRICULAR TACHYCARDIA ABLATION;  Surgeon: Evans Lance, MD;  Location: [facility] CATH LAB;  Service: Cardiovascular;  Laterality: N/A;   TUBAL LIGATION     UTERINE FIBROID SURGERY  ?09/2012   VAGINAL HYSTERECTOMY Bilateral 03/27/2014   Procedure: HYSTERECTOMY VAGINAL WITH BILATERAL SALPINGECTOMY;  Surgeon: Lavonia Drafts, MD;  Location: [facility];  Service: Gynecology;  Laterality: Bilateral;    Family History  Problem Relation Age of Onset   Cancer Brother    Cancer Paternal Aunt        x 2 unkown type   Breast cancer Paternal Aunt    Heart disease Maternal Grandmother    Heart Problems Mother  apid heartbeat   Breast cancer Sister 88   Breast cancer Paternal Grandmother    Colon cancer Neg Hx     Social History   Socioeconomic History   Marital status: Legally Separated    Spouse name: Not on file   Number of children: 4   Years of education: Not on file   Highest education level: Not on file  Occupational History   Occupation: order IT consultant: [employer]  Tobacco Use   Smoking status: Former    Packs/day: 0.33    Years: 3.00    Pack  years: 0.99    Types: Cigarettes    Quit date: 12/27/1984    Years since quitting: 36.1   Smokeless tobacco: Never  Vaping Use   Vaping Use: Never used  Substance and Sexual Activity   Alcohol use: Not Currently   Drug use: Not Currently    Types: Marijuana    Comment: last day used 2 weeks    Sexual activity: Yes    Birth control/protection: None  Other Topics Concern   Not on file  Social History Narrative   Not on file   Social Determinants of Health   Financial Resource Strain: Not on file  Food Insecurity: Not on file  Transportation Needs: Not on file  Physical Activity: Not on file  Stress: Not on file  Social Connections: Not on file  Intimate Partner Violence: Not on file    ROS Review of Systems  Eyes:  Negative for visual disturbance.  Respiratory:  Positive for shortness of breath (no changed).   Cardiovascular:  Negative for chest pain, palpitations and leg swelling.  Gastrointestinal:  Positive for constipation (occasional  OTC which is effective.) and nausea (QOD sometimes worse than others). Negative for blood in stool (Since 5.22), diarrhea and vomiting.  Endocrine: Negative.   Genitourinary:  Negative for dyspareunia, dysuria, enuresis, flank pain, menstrual problem, vaginal bleeding, vaginal discharge and vaginal pain.  Neurological:  Negative for dizziness and headaches.  Hematological: Negative.   Psychiatric/Behavioral:         She has her days Cries when needed and this is effective.    Objective:   Today's Vitals: BP 118/75   Pulse 74   Temp 98.1 F (36.7 C)   Ht 4\' 11"  (1.499 m)   Wt 151 lb 0.8 oz (68.5 kg)   LMP 03/15/2014 (Exact Date)   SpO2 98%   BMI 30.51 kg/m   Physical Exam Constitutional:      Appearance: She is obese.  HENT:     Head: Normocephalic and atraumatic.     Right Ear: Tympanic membrane normal.     Left Ear: Tympanic membrane normal.     Nose: Nose normal.     Mouth/Throat:     Mouth: Mucous membranes are  moist.  Cardiovascular:     Rate and Rhythm: Normal rate and regular rhythm.     Pulses: Normal pulses.     Heart sounds: Normal heart sounds.  Pulmonary:     Effort: Pulmonary effort is normal.     Breath sounds: Normal breath sounds.  Abdominal:     Palpations: Abdomen is soft.     Comments: Hypoactive   Musculoskeletal:        General: Normal range of motion.     Cervical back: Normal range of motion.  Skin:    General: Skin is warm and dry.     Capillary Refill: Capillary refill takes less than 2  seconds.  Neurological:     General: No focal deficit present.     Mental Status: She is alert and oriented to person, place, and time.  Psychiatric:        Mood and Affect: Mood normal.        Behavior: Behavior normal.        Thought Content: Thought content normal.        Judgment: Judgment normal.    Assessment & Plan:   Problem List Items Addressed This Visit   None Visit Diagnoses     Encounter to establish care    -  Primary   Relevant Orders   Urinalysis Dipstick (Completed)   Screening for diabetes mellitus       Relevant Orders   HgB A1c (Completed)   History of rectal bleeding       Relevant Orders   Ambulatory referral to Gastroenterology   Left flank pain       Relevant Orders   US Renal   Urine Culture       Outpatient Encounter Medications as of 02/07/2021  Medication Sig   metoprolol succinate (TOPROL XL) 25 MG 24 hr tablet Take 1 tablet (25 mg total) by mouth daily.   valACYclovir (VALTREX) 500 MG tablet Take 1 tablet (500 mg total) by mouth daily.   [DISCONTINUED] loperamide (IMODIUM) 2 MG capsule Take 1 capsule (2 mg total) by mouth 4 (four) times daily as needed for diarrhea or loose stools.   [DISCONTINUED] meloxicam (MOBIC) 15 MG tablet Take 1 tablet (15 mg total) by mouth daily.   [DISCONTINUED] metoprolol tartrate (LOPRESSOR) 25 MG tablet Take 1 tablet (25 mg total) by mouth 2 (two) times daily as needed. Take one tablet by mouth as needed  for palpitations.  May take up to 2 tablets in 24 hours.   No facility-administered encounter medications on file as of 02/07/2021.    Follow-up: Return in about 1 year (around 02/07/2022) for [place] [[id number]].   Vevelyn Francois, NP

## 2021-02-07 NOTE — Patient Instructions (Addendum)
Health Maintenance, Female Adopting a healthy lifestyle and getting preventive care are important in promoting health and wellness. Ask your health care provider about: The right schedule for you to have regular tests and exams. Things you can do on your own to prevent diseases and keep yourself healthy. What should I know about diet, weight, and exercise? Eat a healthy diet  Eat a diet that includes plenty of vegetables, fruits, low-fat dairy products, and lean protein. Do not eat a lot of foods that are high in solid fats, added sugars, or sodium.  Maintain a healthy weight Body mass index (BMI) is used to identify weight problems. It estimates body fat based on height and weight. Your health care provider can help determineyour BMI and help you achieve or maintain a healthy weight. Get regular exercise Get regular exercise. This is one of the most important things you can do for your health. Most adults should: Exercise for at least 150 minutes each week. The exercise should increase your heart rate and make you sweat (moderate-intensity exercise). Do strengthening exercises at least twice a week. This is in addition to the moderate-intensity exercise. Spend less time sitting. Even light physical activity can be beneficial. Watch cholesterol and blood lipids Have your blood tested for lipids and cholesterol at 56 years of age, then havethis test every 5 years. Have your cholesterol levels checked more often if: Your lipid or cholesterol levels are high. You are older than 56 years of age. You are at high risk for heart disease. What should I know about cancer screening? Depending on your health history and family history, you may need to have cancer screening at various ages. This may include screening for: Breast cancer. Cervical cancer. Colorectal cancer. Skin cancer. Lung cancer. What should I know about heart disease, diabetes, and high blood pressure? Blood pressure and heart  disease High blood pressure causes heart disease and increases the risk of stroke. This is more likely to develop in people who have high blood pressure readings, are of African descent, or are overweight. Have your blood pressure checked: Every 3-5 years if you are 18-39 years of age. Every year if you are 40 years old or older. Diabetes Have regular diabetes screenings. This checks your fasting blood sugar level. Have the screening done: Once every three years after age 40 if you are at a normal weight and have a low risk for diabetes. More often and at a younger age if you are overweight or have a high risk for diabetes. What should I know about preventing infection? Hepatitis B If you have a higher risk for hepatitis B, you should be screened for this virus. Talk with your health care provider to find out if you are at risk forhepatitis B infection. Hepatitis C Testing is recommended for: Everyone born from 1945 through 1965. Anyone with known risk factors for hepatitis C. Sexually transmitted infections (STIs) Get screened for STIs, including gonorrhea and chlamydia, if: You are sexually active and are younger than 56 years of age. You are older than 56 years of age and your health care provider tells you that you are at risk for this type of infection. Your sexual activity has changed since you were last screened, and you are at increased risk for chlamydia or gonorrhea. Ask your health care provider if you are at risk. Ask your health care provider about whether you are at high risk for HIV. Your health care provider may recommend a prescription medicine to help   prevent HIV infection. If you choose to take medicine to prevent HIV, you should first get tested for HIV. You should then be tested every 3 months for as long as you are taking the medicine. Pregnancy If you are about to stop having your period (premenopausal) and you may become pregnant, seek counseling before you get  pregnant. Take 400 to 800 micrograms (mcg) of folic acid every day if you become pregnant. Ask for birth control (contraception) if you want to prevent pregnancy. Osteoporosis and menopause Osteoporosis is a disease in which the bones lose minerals and strength with aging. This can result in bone fractures. If you are 65 years old or older, or if you are at risk for osteoporosis and fractures, ask your health care provider if you should: Be screened for bone loss. Take a calcium or vitamin D supplement to lower your risk of fractures. Be given hormone replacement therapy (HRT) to treat symptoms of menopause. Follow these instructions at home: Lifestyle Do not use any products that contain nicotine or tobacco, such as cigarettes, e-cigarettes, and chewing tobacco. If you need help quitting, ask your health care provider. Do not use street drugs. Do not share needles. Ask your health care provider for help if you need support or information about quitting drugs. Alcohol use Do not drink alcohol if: Your health care provider tells you not to drink. You are pregnant, may be pregnant, or are planning to become pregnant. If you drink alcohol: Limit how much you use to 0-1 drink a day. Limit intake if you are breastfeeding. Be aware of how much alcohol is in your drink. In the [place] one drink equals one 12 oz bottle of beer (355 mL), one 5 oz glass of wine (148 mL), or one 1 oz glass of hard liquor (44 mL). General instructions Schedule regular health, dental, and eye exams. Stay current with your vaccines. Tell your health care provider if: You often feel depressed. You have ever been abused or do not feel safe at home. Summary Adopting a healthy lifestyle and getting preventive care are important in promoting health and wellness. Follow your health care provider's instructions about healthy diet, exercising, and getting tested or screened for diseases. Follow your health care provider's  instructions on monitoring your cholesterol and blood pressure. This information is not intended to replace advice given to you by your health care provider. Make sure you discuss any questions you have with your healthcare provider. Document Revised: 07/06/2018 Document Reviewed: 07/06/2018 Elsevier Patient Education  2022 Elsevier Inc.  

## 2021-02-09 LAB — URINE CULTURE

## 2021-03-06 ENCOUNTER — Telehealth: Payer: Self-pay | Admitting: Internal Medicine

## 2021-03-06 MED ORDER — METOPROLOL SUCCINATE ER 25 MG PO TB24: 25.0000 mg | ORAL_TABLET | Freq: Every day | ORAL | 0 refills | Status: DC

## 2021-03-06 NOTE — Telephone Encounter (Signed)
*  STAT* If patient is at the pharmacy, call can be transferred to refill team.   1. Which medications need to be refilled? (please list name of each medication and dose if known)  metoprolol succinate (TOPROL XL) 25 MG 24 hr tablet  2. Which pharmacy/location (including street and city if local pharmacy) is medication to be sent to? [id number] [place] [address] [place]  3. Do they need a 30 day or 90 day supply? 30 [facility] refills   [facility]

## 2021-03-06 NOTE — Telephone Encounter (Signed)
30 day refill for Metoprolol XL [facility], per pt request.

## 2021-05-01 ENCOUNTER — Ambulatory Visit (HOSPITAL_COMMUNITY)
Admission: EM | Admit: 2021-05-01 | Discharge: 2021-05-01 | Disposition: A | Discharge status: Home / Self Care | Payer: BC Managed Care – PPO | Attending: Physician Assistant | Admitting: Physician Assistant

## 2021-05-01 ENCOUNTER — Other Ambulatory Visit: Payer: Self-pay

## 2021-05-01 ENCOUNTER — Encounter (HOSPITAL_COMMUNITY): Payer: Self-pay | Admitting: Emergency Medicine

## 2021-05-01 DIAGNOSIS — L309 Dermatitis, unspecified: Secondary | ICD-10-CM

## 2021-05-01 MED ORDER — PREDNISONE 20 MG PO TABS: 40.0000 mg | ORAL_TABLET | Freq: Every day | ORAL | 0 refills | Status: AC

## 2021-05-01 NOTE — Discharge Instructions (Addendum)
Take prednisone as prescribed. Follow up with any further concerns.

## 2021-05-01 NOTE — ED Triage Notes (Signed)
Pt reports used color on her hair 2 days ago. Reports noticed swelling yesterday to face, posterior ear and neck. Reports itching.

## 2021-05-01 NOTE — ED Provider Notes (Signed)
[place]    CSN: [id number] Arrival date & time: 05/01/21  0946      History   Chief Complaint Chief Complaint  Patient presents with   Allergic Reaction   Facial Swelling    HPI [name] is a 55 y.o. female.   Patient here today for evaluation of itching and irritation to her scalp after using hair dye last night.  She reports that she has known sensitive skin, and was told not to use hair dye, however she was worried about the graying hairs, and used hair dye last night.  After using she noticed itching to her scalp, and has had some associated pain and swelling as well.  She denies any fevers or chills.  She has not any shortness of breath or difficulty swallowing.  She does not report any treatment for symptoms.  The history is provided by the patient.  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   Chest pain    CHEST PAIN 11/13/2008   Qualifier: Diagnosis of  By: Darrick Meigs     Chronic anemia    Difficulty sleeping    Dysmenorrhea 2011   Dysrhythmia    irregular heart beats   Fibroids 09/20/2013   Heart palpitations    History of abdominal hysterectomy 03/2014   History of uterine fibroid    Menorrhagia    Palpitations 09/23/2006   Qualifier: Diagnosis of  By: Benna Dunks     Shortness of breath    "associated w/high heart rate" (01/04/2013)   SUPRAVENTRICULAR TACHYCARDIA 11/13/2008   Qualifier: Diagnosis of  By: Darrick Meigs     Supraventricular tachycardia [facility])     Patient Active Problem List   Diagnosis Date Noted   Atrial tachycardia ([place]) 11/29/2020   Hypertension 11/17/2019   Encounter for annual routine gynecological examination 09/30/2016   Post-operative state 03/27/2014   Fibroids 09/20/2013   Dysmenorrhea 11/17/2012   ALCOHOL USE  11/13/2008   SUPRAVENTRICULAR TACHYCARDIA 11/13/2008   CHEST PAIN 11/13/2008   PALPITATIONS 09/23/2006    Past Surgical History:  Procedure Laterality Date   ABDOMINAL HYSTERECTOMY     AUGMENTATION MAMMAPLASTY Bilateral    CARDIAC ELECTROPHYSIOLOGY MAPPING AND ABLATION  ~ 2008; 01/04/2013   "weren't able to do the ablation" (01/04/2013)   COLONOSCOPY  12/12/2013   COMBINED AUGMENTATION MAMMAPLASTY AND ABDOMINOPLASTY Bilateral 2002   ELECTROPHYSIOLOGY STUDY N/A 01/04/2013   Procedure: ELECTROPHYSIOLOGY STUDY;  Surgeon: Evans Lance, MD;  Location: [facility] CATH LAB;  Service: Cardiovascular;  Laterality: N/A;   SUPRAVENTRICULAR TACHYCARDIA ABLATION N/A 01/04/2013   Procedure: SUPRAVENTRICULAR TACHYCARDIA ABLATION;  Surgeon: Evans Lance, MD;  Location: [facility] CATH LAB;  Service: Cardiovascular;  Laterality: N/A;   TUBAL LIGATION     UTERINE FIBROID SURGERY  ?09/2012   VAGINAL HYSTERECTOMY Bilateral 03/27/2014   Procedure: HYSTERECTOMY VAGINAL WITH BILATERAL SALPINGECTOMY;  Surgeon: Lavonia Drafts, MD;  Location: [facility];  Service: Gynecology;  Laterality: Bilateral;    OB History     Gravida  3   Para  3   Term  3   Preterm  0   AB  0   Living  4      SAB  0   IAB  0   Ectopic  0   Multiple  1  Live Births               Home Medications    Prior to Admission medications   Medication Sig Start Date End Date Taking? Authorizing Provider  predniSONE (DELTASONE) 20 MG tablet Take 2 tablets (40 mg total) by mouth daily with breakfast for 5 days. 05/01/21 05/06/21 Yes Francene Finders, PA-C  metoprolol succinate (TOPROL XL) 25 MG 24 hr tablet Take 1 tablet (25 mg total) by mouth daily. 03/06/21   Jerline Pain, MD  valACYclovir (VALTREX) 500 MG tablet Take 1 tablet (500 mg total) by mouth daily. 02/07/21 02/07/22  Vevelyn Francois, NP    Family History Family History  Problem Relation Age of Onset   Cancer Brother    Cancer Paternal Aunt        x 2 unkown type    Breast cancer Paternal Aunt    Heart disease Maternal Grandmother    Heart Problems Mother        apid heartbeat   Breast cancer Sister 38   Breast cancer Paternal Grandmother    Colon cancer Neg Hx     Social History Social History   Tobacco Use   Smoking status: Former    Packs/day: 0.33    Years: 3.00    Pack years: 0.99    Types: Cigarettes    Quit date: 12/27/1984    Years since quitting: 36.3   Smokeless tobacco: Never  Vaping Use   Vaping Use: Never used  Substance Use Topics   Alcohol use: Not Currently   Drug use: Not Currently    Types: Marijuana    Comment: last day used 2 weeks      Allergies   Patient has no known allergies.   Review of Systems Review of Systems  Constitutional:  Negative for chills and fever.  HENT:  Negative for trouble swallowing.   Eyes:  Negative for discharge and redness.  Respiratory:  Negative for shortness of breath.   Cardiovascular:  Negative for chest pain.  Gastrointestinal:  Negative for nausea and vomiting.    Physical Exam Triage Vital Signs ED Triage Vitals  Enc Vitals Group     BP 05/01/21 1001 140/86     Pulse Rate 05/01/21 1001 87     Resp 05/01/21 1001 16     Temp 05/01/21 1001 98 F (36.7 C)     Temp Source 05/01/21 1001 Oral     SpO2 05/01/21 1001 98 %     Weight --      Height --      Head Circumference --      Peak Flow --      Pain Score 05/01/21 0959 8     Pain Loc --      Pain Edu? --      Excl. in [place]? --    No data found.  Updated Vital Signs BP 140/86 (BP Location: Left Arm)   Pulse 87   Temp 98 F (36.7 C) (Oral)   Resp 16   LMP 03/15/2014 (Exact Date)   SpO2 98%   Physical Exam Vitals and nursing note reviewed.  Constitutional:      General: She is not in acute distress.    Appearance: Normal appearance. She is not ill-appearing.  HENT:     Head: Normocephalic and atraumatic.  Eyes:     Conjunctiva/sclera: Conjunctivae normal.  Cardiovascular:     Rate and Rhythm: Normal  rate.  Pulmonary:  Effort: Pulmonary effort is normal.  Skin:    Comments: Scalp appears to be inflamed, dry  Neurological:     Mental Status: She is alert.  Psychiatric:        Mood and Affect: Mood normal.        Behavior: Behavior normal.     UC Treatments / Results  Labs (all labs ordered are listed, but only abnormal results are displayed) Labs Reviewed - No data to display  EKG   Radiology No results found.  Procedures Procedures (including critical care time)  Medications Ordered in UC Medications - No data to display  Initial Impression / Assessment and Plan / UC Course  I have reviewed the triage vital signs and the nursing notes.  Pertinent labs & imaging results that were available during my care of the patient were reviewed by me and considered in my medical decision making (see chart for details).  Will prescribe prednisone burst for suspected reaction to chemicals and hair dye.  Recommended follow-up if no improvement or if symptoms worsen anyway.  Final Clinical Impressions(s) / UC Diagnoses   Final diagnoses:  Dermatitis     Discharge Instructions      Take prednisone as prescribed. Follow up with any further concerns.      ED Prescriptions     Medication Sig Dispense Auth. Provider   predniSONE (DELTASONE) 20 MG tablet Take 2 tablets (40 mg total) by mouth daily with breakfast for 5 days. 10 tablet Francene Finders, PA-C      PDMP not reviewed this encounter.   Francene Finders, PA-C 05/01/21 1104

## 2021-05-07 ENCOUNTER — Other Ambulatory Visit: Payer: Self-pay

## 2021-05-07 ENCOUNTER — Encounter (HOSPITAL_COMMUNITY): Payer: Self-pay

## 2021-05-07 ENCOUNTER — Ambulatory Visit (HOSPITAL_COMMUNITY)
Admission: EM | Admit: 2021-05-07 | Discharge: 2021-05-07 | Disposition: A | Discharge status: Home / Self Care | Payer: BC Managed Care – PPO | Attending: Physician Assistant | Admitting: Physician Assistant

## 2021-05-07 DIAGNOSIS — L234 Allergic contact dermatitis due to dyes: Secondary | ICD-10-CM

## 2021-05-07 MED ORDER — PREDNISONE 10 MG (21) PO TBPK: ORAL_TABLET | ORAL | 0 refills | Status: DC

## 2021-05-07 NOTE — ED Provider Notes (Signed)
[name]    CSN: [id number] Arrival date & time: 05/07/21  0844      History   Chief Complaint Chief Complaint  Patient presents with   Hair/Scalp Problem    HPI [name] is a 55 y.o. female.   Patient presents today with a 1 week history of intense pruritus on her scalp.  She was seen by our clinic on 05/01/2021 at which point symptoms were attributed to contact dermatitis related to hair dye she has had similar reactions in the past and had just use hair dye prior to symptom onset.  She denies any additional changes to personal hygiene products or soaps/detergents.  She was prescribed prednisone burst (40 mg x 5 days) and reports that symptoms have improved but not yet resolved.  She continues to have intense pruritus though swelling and serous drainage has resolved.  She has been taking Benadryl and Naprosyn without improvement of symptoms.  She believes that she needs a longer course of steroids to manage her symptoms she has previously required several rounds when she has had similar reactions in the past.  Denies any difficulty speaking, difficulty swallowing, shortness of breath, swelling of face/tongue, nausea, vomiting.   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   Chest pain    CHEST PAIN 11/13/2008   Qualifier: Diagnosis of  By: Darrick Meigs     Chronic anemia    Difficulty sleeping    Dysmenorrhea 2011   Dysrhythmia    irregular heart beats   Fibroids 09/20/2013   Heart palpitations    History of abdominal hysterectomy 03/2014   History of uterine fibroid    Menorrhagia    Palpitations 09/23/2006   Qualifier: Diagnosis of  By: Benna Dunks     Shortness of breath    "associated w/high heart rate" (01/04/2013)   SUPRAVENTRICULAR TACHYCARDIA 11/13/2008   Qualifier: Diagnosis of  By: Darrick Meigs     Supraventricular  tachycardia [facility])     Patient Active Problem List   Diagnosis Date Noted   Atrial tachycardia ([place]) 11/29/2020   Hypertension 11/17/2019   Encounter for annual routine gynecological examination 09/30/2016   Post-operative state 03/27/2014   Fibroids 09/20/2013   Dysmenorrhea 11/17/2012   ALCOHOL USE 11/13/2008   SUPRAVENTRICULAR TACHYCARDIA 11/13/2008   CHEST PAIN 11/13/2008   PALPITATIONS 09/23/2006    Past Surgical History:  Procedure Laterality Date   ABDOMINAL HYSTERECTOMY     AUGMENTATION MAMMAPLASTY Bilateral    CARDIAC ELECTROPHYSIOLOGY MAPPING AND ABLATION  ~ 2008; 01/04/2013   "weren't able to do the ablation" (01/04/2013)   COLONOSCOPY  12/12/2013   COMBINED AUGMENTATION MAMMAPLASTY AND ABDOMINOPLASTY Bilateral 2002   ELECTROPHYSIOLOGY STUDY N/A 01/04/2013   Procedure: ELECTROPHYSIOLOGY STUDY;  Surgeon: Evans Lance, MD;  Location: [facility] CATH LAB;  Service: Cardiovascular;  Laterality: N/A;   SUPRAVENTRICULAR TACHYCARDIA ABLATION N/A 01/04/2013   Procedure: SUPRAVENTRICULAR TACHYCARDIA ABLATION;  Surgeon: Evans Lance, MD;  Location[facility] C[facility] LAB;  Service: Cardiovascular;  Laterality: N/A;   TUBAL LIGATION     UTERINE FIBROID SURGERY  ?09/2012   VAGINAL HYSTERECTOMY Bilateral 03/27/2014   Procedure: HYSTERECTOMY VAGINAL WITH BILATERAL SALPINGECTOMY;  Surgeon: Lavonia Drafts, MD;  Location: [facility];  Service: Gynecology;  Laterality: Bilateral;    OB History     Gravida  3   Para  3  Term  3   Preterm  0   AB  0   Living  4      SAB  0   IAB  0   Ectopic  0   Multiple  1   Live Births               Home Medications    Prior to Admission medications   Medication Sig Start Date End Date Taking? Authorizing Provider  predniSONE (STERAPRED UNI-PAK 21 TAB) 10 MG (21) TBPK tablet As directed 05/07/21  Yes Marolyn Urschel K, PA-C  metoprolol succinate (TOPROL XL) 25 MG 24 hr tablet Take 1 tablet (25 mg total) by mouth daily. 03/06/21   Jerline Pain, MD  valACYclovir (VALTREX) 500 MG tablet Take 1 tablet (500 mg total) by mouth daily. 02/07/21 02/07/22  Vevelyn Francois, NP    Family History Family History  Problem Relation Age of Onset   Cancer Brother    Cancer Paternal Aunt        x 2 unkown type   Breast cancer Paternal Aunt    Heart disease Maternal Grandmother    Heart Problems Mother        apid heartbeat   Breast cancer Sister 59   Breast cancer Paternal Grandmother    Colon cancer Neg Hx     Social History Social History   Tobacco Use   Smoking status: Former    Packs/day: 0.33    Years: 3.00    Pack years: 0.99    Types: Cigarettes    Quit date: 12/27/1984    Years since quitting: 36.3   Smokeless tobacco: Never  Vaping Use   Vaping Use: Never used  Substance Use Topics   Alcohol use: Not Currently   Drug use: Not Currently    Types: Marijuana    Comment: last day used 2 weeks      Allergies   Patient has no known allergies.   Review of Systems Review of Systems  Constitutional:  Negative for activity change, appetite change, fatigue and fever.  HENT:  Negative for sore throat, trouble swallowing and voice change.   Respiratory:  Negative for cough and shortness of breath.   Cardiovascular:  Negative for chest pain.  Gastrointestinal:  Negative for abdominal pain, diarrhea, nausea and vomiting.  Skin:  Positive for rash. Negative for color change and wound.  Neurological:  Negative for dizziness, light-headedness and headaches.    Physical Exam Triage Vital Signs ED Triage Vitals  Enc Vitals Group     BP 05/07/21 1053 134/80     Pulse Rate 05/07/21 1053 79     Resp 05/07/21 1053 18     Temp 05/07/21 1053 98 F (36.7 C)     Temp Source 05/07/21 1053 Oral     SpO2 05/07/21 1053 98 %     Weight --      Height --      Head Circumference --      Peak Flow --      Pain Score 05/07/21 1052 0     Pain Loc --      Pain Edu? --      Excl. in [place]? --    No data found.  Updated Vital  Signs BP 134/80 (BP Location: Right Arm)   Pulse 79   Temp 98 F (36.7 C) (Oral)   Resp 18   LMP 03/15/2014 (Exact Date)   SpO2 98%   Visual Acuity Right  Eye Distance:   Left Eye Distance:   Bilateral Distance:    Right Eye Near:   Left Eye Near:    Bilateral Near:     Physical Exam Vitals reviewed.  Constitutional:      General: She is awake. She is not in acute distress.    Appearance: Normal appearance. She is well-developed. She is not ill-appearing.     Comments: Very pleasant female appears stated age no acute distress sitting comfortably in exam room  HENT:     Head: Normocephalic and atraumatic.     Mouth/Throat:     Pharynx: Uvula midline. No oropharyngeal exudate or posterior oropharyngeal erythema.  Cardiovascular:     Rate and Rhythm: Normal rate and regular rhythm.     Heart sounds: Normal heart sounds, S1 normal and S2 normal. No murmur heard. Pulmonary:     Effort: Pulmonary effort is normal.     Breath sounds: Normal breath sounds. No wheezing, rhonchi or rales.     Comments: Clear to auscultation bilaterally Abdominal:     Palpations: Abdomen is soft.     Tenderness: There is no abdominal tenderness.  Skin:    Findings: Rash present. Rash is scaling.     Comments: Scaling rash of scalp noted.  No open wounds or drainage noted.  Psychiatric:        Behavior: Behavior is cooperative.     UC Treatments / Results  Labs (all labs ordered are listed, but only abnormal results are displayed) Labs Reviewed - No data to display  EKG   Radiology No results found.  Procedures Procedures (including critical care time)  Medications Ordered in UC Medications - No data to display  Initial Impression / Assessment and Plan / UC Course  I have reviewed the triage vital signs and the nursing notes.  Pertinent labs & imaging results that were available during my care of the patient were reviewed by me and considered in my medical decision making (see  chart for details).      Symptoms are consistent with allergic contact dermatitis.  Patient had improvement but not resolution of symptoms so will extend prednisone as a taper for another week.  She was instructed not to take NSAIDs including aspirin, ibuprofen/Advil, naproxen/Aleve with this medication.  She can use emollients as needed.  Discussed the importance of avoiding hair dye as symptoms will likely recur.  Recommend that she alternate antihistamines for additional symptom relief with taking Zyrtec in the morning and Pepcid at night.  Discussed alarm symptoms that warrant emergent evaluation.  Strict return precautions given to which she expressed understanding.  Final Clinical Impressions(s) / UC Diagnoses   Final diagnoses:  Allergic contact dermatitis due to dyes     Discharge Instructions      I believe that you are having a allergic reaction to the dye.  It is very poor that you avoid dye in the future as you will likely have recurrent episodes.  Please start prednisone taper as prescribed.  Do not take NSAIDs including aspirin, ibuprofen/Advil, naproxen/Aleve due to risk of GI bleeding.  Recommend he alternate antihistamines including Zyrtec in the morning and Pepcid at night to help with your itching.  If you have any worsening symptoms including difficulty breathing, difficulty speaking, difficulty swallowing, swelling of face or mouth you need to go to the emergency room as we discussed.     ED Prescriptions     Medication Sig Dispense Auth. Provider   predniSONE (STERAPRED UNI-PAK 21  TAB) 10 MG (21) TBPK tablet As directed 21 tablet Mccoy Testa K, PA-C      PDMP not reviewed this encounter.   Terrilee Croak, PA-C 05/07/21 1125

## 2021-05-07 NOTE — ED Triage Notes (Signed)
Pt reports itching and burning sensation in the scalp x 1 week after using a hair color. Sates she was seen for same complaint and was prescribed prednisone and started feeling better.

## 2021-05-07 NOTE — ED Notes (Signed)
Called in lobby no answered.  

## 2021-05-07 NOTE — Discharge Instructions (Addendum)
I believe that you are having a allergic reaction to the dye.  It is very poor that you avoid dye in the future as you will likely have recurrent episodes.  Please start prednisone taper as prescribed.  Do not take NSAIDs including aspirin, ibuprofen/Advil, naproxen/Aleve due to risk of GI bleeding.  Recommend he alternate antihistamines including Zyrtec in the morning and Pepcid at night to help with your itching.  If you have any worsening symptoms including difficulty breathing, difficulty speaking, difficulty swallowing, swelling of face or mouth you need to go to the emergency room as we discussed.

## 2021-07-03 ENCOUNTER — Encounter (HOSPITAL_COMMUNITY): Payer: Self-pay

## 2021-07-03 ENCOUNTER — Other Ambulatory Visit: Payer: Self-pay

## 2021-07-03 ENCOUNTER — Ambulatory Visit (INDEPENDENT_AMBULATORY_CARE_PROVIDER_SITE_OTHER): Payer: BC Managed Care – PPO

## 2021-07-03 ENCOUNTER — Ambulatory Visit (HOSPITAL_COMMUNITY)
Admission: EM | Admit: 2021-07-03 | Discharge: 2021-07-03 | Discharge status: Against Medical Advice | Payer: BC Managed Care – PPO | Attending: Family Medicine | Admitting: Family Medicine

## 2021-07-03 DIAGNOSIS — R059 Cough, unspecified: Secondary | ICD-10-CM

## 2021-07-03 DIAGNOSIS — R0602 Shortness of breath: Secondary | ICD-10-CM | POA: Diagnosis not present

## 2021-07-03 DIAGNOSIS — J9811 Atelectasis: Secondary | ICD-10-CM | POA: Diagnosis not present

## 2021-07-03 DIAGNOSIS — J069 Acute upper respiratory infection, unspecified: Secondary | ICD-10-CM | POA: Diagnosis not present

## 2021-07-03 LAB — POC INFLUENZA A AND B ANTIGEN (URGENT CARE ONLY)
INFLUENZA A ANTIGEN, POC: NEGATIVE
INFLUENZA B ANTIGEN, POC: NEGATIVE

## 2021-07-03 NOTE — ED Triage Notes (Signed)
Pt presents with c/o headache, body aches and coughing x 2 weeks.

## 2021-07-03 NOTE — ED Provider Notes (Signed)
[place]   [id number] 07/03/21 Arrival Time: 7867  ASSESSMENT & PLAN:  1. Viral URI with cough   2. SOB (shortness of breath)    Patient decided to leave before completing care.  I have personally viewed the imaging studies ordered this visit. No signs of PNA.  Results for orders placed or performed during the hospital encounter of 07/03/21  POC Influenza A & B Ag (Urgent Care)  Result Value Ref Range   INFLUENZA A ANTIGEN, POC NEGATIVE NEGATIVE   INFLUENZA B ANTIGEN, POC NEGATIVE NEGATIVE    Reviewed expectations re: course of current medical issues. Questions answered. Outlined signs and symptoms indicating need for more acute intervention. Understanding verbalized. After Visit Summary given.   SUBJECTIVE: History from: patient. [name] is a 55 y.o. female who reports: cough, body aches, SOB; x 2 weeks; unsure of fevers. Denies: CP. Normal PO intake without n/v/d. Ambulatory. No LE edema. No back pain. No tx PTA.  OBJECTIVE:  Vitals:   07/03/21 1447  BP: (!) 137/91  Pulse: 77  Resp: 19  Temp: 98.4 F (36.9 C)  TempSrc: Oral  SpO2: 97%    General appearance: alert; no distress; appears fatigued Eyes: PERRLA; EOMI; conjunctiva normal HENT: [place]; AT; with nasal congestion Neck: supple  Lungs: pt left before I was able to examine lungs Extremities: no edema Skin: warm and dry Neurologic: normal gait Psychological: alert and cooperative; normal mood and affect  Labs: Results for orders placed or performed during the hospital encounter of 07/03/21  POC Influenza A & B Ag (Urgent Care)  Result Value Ref Range   INFLUENZA A ANTIGEN, POC NEGATIVE NEGATIVE   INFLUENZA B ANTIGEN, POC NEGATIVE NEGATIVE   Labs Reviewed  POC INFLUENZA A AND B ANTIGEN (URGENT CARE ONLY)    Imaging: DG Chest 2 View  Result Date: 07/03/2021 CLINICAL DATA:  Cough and shortness of breath. EXAM: CHEST - 2 VIEW COMPARISON:  11/21/2012 FINDINGS: The cardiac  silhouette, mediastinal and hilar contours are normal. Streaky subsegmental basilar atelectasis but no infiltrates, edema or effusions. The bony thorax is intact. IMPRESSION: Streaky subsegmental basilar atelectasis but no infiltrates, edema or effusions. Electronically Signed   By: Marijo Sanes M.D.   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   Chest pain    CHEST PAIN 11/13/2008   Qualifier: Diagnosis of  By: Darrick Meigs     Chronic anemia    Difficulty sleeping    Dysmenorrhea 2011   Dysrhythmia    irregular heart beats   Fibroids 09/20/2013   Heart palpitations    History of abdominal hysterectomy 03/2014   History of uterine fibroid    Menorrhagia    Palpitations 09/23/2006   Qualifier: Diagnosis of  By: Benna Dunks     Shortness of breath    "associated w/high heart rate" (01/04/2013)   SUPRAVENTRICULAR TACHYCARDIA 11/13/2008   Qualifier: Diagnosis of  By: Darrick Meigs     Supraventricular tachycardia [facility])    Social History   Socioeconomic History   Marital status: Legally Separated    Spouse name: Not on file   Number of children: 4   Years of education: Not on file   Highest education level: Not on file  Occupational History   Occupation: order Optician, dispensing  Employer: [employer]  Tobacco Use   Smoking status: Former    Packs/day: 0.33    Years: 3.00    Pack years: 0.99    Types: Cigarettes    Quit date: 12/27/1984    Years since quitting: 36.5   Smokeless tobacco: Never  Vaping Use   Vaping Use: Never used  Substance and Sexual Activity   Alcohol use: Not Currently   Drug use: Not Currently    Types: Marijuana    Comment: last day used 2 weeks    Sexual activity: Yes    Birth control/protection: None  Other Topics Concern   Not on file  Social History Narrative   Not on file   Social  Determinants of Health   Financial Resource Strain: Not on file  Food Insecurity: Not on file  Transportation Needs: Not on file  Physical Activity: Not on file  Stress: Not on file  Social Connections: Not on file  Intimate Partner Violence: Not on file   Family History  Problem Relation Age of Onset   Cancer Brother    Cancer Paternal Aunt        x 2 unkown type   Breast cancer Paternal Aunt    Heart disease Maternal Grandmother    Heart Problems Mother        apid heartbeat   Breast cancer Sister 11   Breast cancer Paternal Grandmother    Colon cancer Neg Hx    Past Surgical History:  Procedure Laterality Date   ABDOMINAL HYSTERECTOMY     AUGMENTATION MAMMAPLASTY Bilateral    CARDIAC ELECTROPHYSIOLOGY MAPPING AND ABLATION  ~ 2008; 01/04/2013   "weren't able to do the ablation" (01/04/2013)   COLONOSCOPY  12/12/2013   COMBINED AUGMENTATION MAMMAPLASTY AND ABDOMINOPLASTY Bilateral 2002   ELECTROPHYSIOLOGY STUDY N/A 01/04/2013   Procedure: ELECTROPHYSIOLOGY STUDY;  Surgeon: Evans Lance, MD;  Location: [facility] CATH LAB;  Service: Cardiovascular;  Laterality: N/A;   SUPRAVENTRICULAR TACHYCARDIA ABLATION N/A 01/04/2013   Procedure: SUPRAVENTRICULAR TACHYCARDIA ABLATION;  Surgeon: Evans Lance, MD;  Location: [facility] CATH LAB;  Service: Cardiovascular;  Laterality: N/A;   TUBAL LIGATION     UTERINE FIBROID SURGERY  ?09/2012   VAGINAL HYSTERECTOMY Bilateral 03/27/2014   Procedure: HYSTERECTOMY VAGINAL WITH BILATERAL SALPINGECTOMY;  Surgeon: Lavonia Drafts, MD;  Location: [facility];  Service: Gynecology;  Laterality: Bilateral;     Vanessa Kick, MD 07/03/21 [phone]

## 2022-03-25 ENCOUNTER — Other Ambulatory Visit: Payer: Self-pay | Admitting: Student

## 2022-03-25 DIAGNOSIS — Z1231 Encounter for screening mammogram for malignant neoplasm of breast: Secondary | ICD-10-CM

## 2022-04-08 ENCOUNTER — Ambulatory Visit
Admission: RE | Admit: 2022-04-08 | Discharge: 2022-04-08 | Disposition: A | Discharge status: Home / Self Care | Payer: BC Managed Care – PPO | Source: Ambulatory Visit | Attending: Student | Admitting: Student

## 2022-04-08 ENCOUNTER — Other Ambulatory Visit: Payer: Self-pay | Admitting: Student

## 2022-04-08 DIAGNOSIS — Z1231 Encounter for screening mammogram for malignant neoplasm of breast: Secondary | ICD-10-CM

## 2022-04-20 ENCOUNTER — Ambulatory Visit (HOSPITAL_COMMUNITY)
Admission: EM | Admit: 2022-04-20 | Discharge: 2022-04-20 | Disposition: A | Discharge status: Home / Self Care | Payer: BC Managed Care – PPO | Attending: Internal Medicine | Admitting: Internal Medicine

## 2022-04-20 ENCOUNTER — Encounter (HOSPITAL_COMMUNITY): Payer: Self-pay | Admitting: Emergency Medicine

## 2022-04-20 DIAGNOSIS — J069 Acute upper respiratory infection, unspecified: Secondary | ICD-10-CM | POA: Diagnosis not present

## 2022-04-20 DIAGNOSIS — J029 Acute pharyngitis, unspecified: Secondary | ICD-10-CM | POA: Insufficient documentation

## 2022-04-20 DIAGNOSIS — U071 COVID-19: Secondary | ICD-10-CM | POA: Insufficient documentation

## 2022-04-20 DIAGNOSIS — R519 Headache, unspecified: Secondary | ICD-10-CM | POA: Insufficient documentation

## 2022-04-20 DIAGNOSIS — Z79899 Other long term (current) drug therapy: Secondary | ICD-10-CM | POA: Diagnosis not present

## 2022-04-20 LAB — RESP PANEL BY RT-PCR (FLU A&B, COVID) ARPGX2
Influenza A by PCR: NEGATIVE
Influenza B by PCR: NEGATIVE
SARS Coronavirus 2 by RT PCR: POSITIVE — AB

## 2022-04-20 MED ORDER — IBUPROFEN 800 MG PO TABS
800.0000 mg | ORAL_TABLET | Freq: Once | ORAL | Status: AC
  Administered 2022-04-20: 800 mg via ORAL

## 2022-04-20 MED ORDER — IBUPROFEN 800 MG PO TABS
ORAL_TABLET | ORAL | Status: AC
  Filled 2022-04-20: qty 1

## 2022-04-20 MED ORDER — BENZONATATE 100 MG PO CAPS: 100.0000 mg | ORAL_CAPSULE | Freq: Three times a day (TID) | ORAL | 0 refills | Status: DC

## 2022-04-20 MED ORDER — GUAIFENESIN ER 1200 MG PO TB12: 1200.0000 mg | ORAL_TABLET | Freq: Two times a day (BID) | ORAL | 0 refills | Status: DC

## 2022-04-20 NOTE — ED Triage Notes (Signed)
Since Friday had body aches, cough-that is productive, sore throat. Took alka seltzer and sinus OTC medication, aleve.

## 2022-04-20 NOTE — ED Provider Notes (Signed)
[place]    CSN: [id number] Arrival date & time: 04/20/22  1128      History   Chief Complaint Chief Complaint  Patient presents with   Cough   Sore Throat    HPI [name] is a 56 y.o. female.   Patient presents to urgent care for evaluation of sore throat, body aches, headaches, nausea, and cough that started on Friday April 17, 2022 (4 days ago). States symptoms have improved significantly since onset 4 days ago. Headache is generalized and currently a 9 on a scale of 0-10. No blurry vision, decreased visual acuity, or dizziness. Sore throat is an 8 on a scale of 0-10 and she states it hurts to swallow. Had one episode of emesis this morning, but attributes this to taking so much medicine to try to help with her symptoms and lack of appetite. Cough is dry and worse at night. Former cigarette smoker but quit "years ago". Still smokes marijuana intermittently. Denies COPD/asthma diagnosis. She has been taking over the counter sudafed, alkaseltzer plus, and aleve at home with some relief of symptoms. Reports slight shortness of breath without chest pain or weakness. No loss of taste and smell. She is not vaccinated against COVID-19 or flu. Thinks she got sick from sick contact at work with similar symptoms.    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   Chest pain    CHEST PAIN 11/13/2008   Qualifier: Diagnosis of  By: Darrick Meigs     Chronic anemia    Difficulty sleeping    Dysmenorrhea 2011   Dysrhythmia    irregular heart beats   Fibroids 09/20/2013   Heart palpitations    History of abdominal hysterectomy 03/2014   History of uterine fibroid    Menorrhagia    Palpitations 09/23/2006   Qualifier: Diagnosis of  By: Benna Dunks     Shortness of breath    "associated w/high heart rate" (01/04/2013)    SUPRAVENTRICULAR TACHYCARDIA 11/13/2008   Qualifier: Diagnosis of  By: Darrick Meigs     Supraventricular tachycardia [facility])     Patient Active Problem List   Diagnosis Date Noted   Atrial tachycardia ([place]) 11/29/2020   Hypertension 11/17/2019   Encounter for annual routine gynecological examination 09/30/2016   Post-operative state 03/27/2014   Fibroids 09/20/2013   Dysmenorrhea 11/17/2012   ALCOHOL USE 11/13/2008   SUPRAVENTRICULAR TACHYCARDIA 11/13/2008   CHEST PAIN 11/13/2008   PALPITATIONS 09/23/2006    Past Surgical History:  Procedure Laterality Date   ABDOMINAL HYSTERECTOMY     AUGMENTATION MAMMAPLASTY Bilateral    CARDIAC ELECTROPHYSIOLOGY MAPPING AND ABLATION  ~ 2008; 01/04/2013   "weren't able to do the ablation" (01/04/2013)   COLONOSCOPY  12/12/2013   COMBINED AUGMENTATION MAMMAPLASTY AND ABDOMINOPLASTY Bilateral 2002   ELECTROPHYSIOLOGY STUDY N/A 01/04/2013   Procedure: ELECTROPHYSIOLOGY STUDY;  Surgeon: Evans Lance, MD;  Location: [facility] CATH LAB;  Service: Cardiovascular;  Laterality: N/A;   SUPRAVENTRICULAR TACHYCARDIA ABLATION N/A 01/04/2013   Procedure: SUPRAVENTRICULAR TACHYCARDIA ABLATION;  Surgeon: Evans Lance, MD;  Location: [facility] CATH LAB;  Service: Cardiovascular;  Laterality: N/A;   TUBAL LIGATION     UTERINE FIBROID SURGERY  ?09/2012   VAGINAL HYSTERECTOMY Bilateral 03/27/2014   Procedure: HYSTERECTOMY VAGINAL WITH BILATERAL SALPINGECTOMY;  Surgeon: Lavonia Drafts, MD;  Location:  [facility];  Service: Gynecology;  Laterality: Bilateral;    OB History     Gravida  3   Para  3   Term  3   Preterm  0   AB  0   Living  4      SAB  0   IAB  0   Ectopic  0   Multiple  1   Live Births               Home Medications    Prior to Admission medications   Medication Sig Start Date End Date Taking? Authorizing Provider  benzonatate (TESSALON) 100 MG capsule Take 1 capsule (100 mg total) by mouth every 8 (eight) hours. 04/20/22  Yes  Talbot Grumbling, FNP  Guaifenesin 1200 MG TB12 Take 1 tablet (1,200 mg total) by mouth in the morning and at bedtime. 04/20/22  Yes Talbot Grumbling, FNP  metoprolol succinate (TOPROL XL) 25 MG 24 hr tablet Take 1 tablet (25 mg total) by mouth daily. 03/06/21   Jerline Pain, MD  predniSONE (STERAPRED UNI-PAK 21 TAB) 10 MG (21) TBPK tablet As directed 05/07/21   Raspet, Derry Skill, PA-C    Family History Family History  Problem Relation Age of Onset   Cancer Brother    Cancer Paternal Aunt        x 2 unkown type   Breast cancer Paternal Aunt    Heart disease Maternal Grandmother    Heart Problems Mother        apid heartbeat   Breast cancer Sister 89   Breast cancer Paternal Grandmother    Colon cancer Neg Hx     Social History Social History   Tobacco Use   Smoking status: Former    Packs/day: 0.33    Years: 3.00    Total pack years: 0.99    Types: Cigarettes    Quit date: 12/27/1984    Years since quitting: 37.3   Smokeless tobacco: Never  Vaping Use   Vaping Use: Never used  Substance Use Topics   Alcohol use: Not Currently   Drug use: Not Currently    Types: Marijuana    Comment: last day used 2 weeks      Allergies   Patient has no known allergies.   Review of Systems Review of Systems  Respiratory:  Positive for cough.   Per HPI   Physical Exam Triage Vital Signs ED Triage Vitals  Enc Vitals Group     BP 04/20/22 1239 118/86     Pulse Rate 04/20/22 1239 86     Resp 04/20/22 1239 18     Temp 04/20/22 1239 98.2 F (36.8 C)     Temp Source 04/20/22 1239 Oral     SpO2 04/20/22 1239 98 %     Weight --      Height --      Head Circumference --      Peak Flow --      Pain Score 04/20/22 1238 7     Pain Loc --      Pain Edu? --      Excl. in [place]? --    No data found.  Updated Vital Signs BP 118/86 (BP Location: Right Arm)   Pulse 86   Temp 98.2 F (36.8 C) (Oral)   Resp 18   LMP 03/15/2014 (Exact Date)   SpO2 98%   Visual  Acuity Right Eye Distance:   Left Eye Distance:  Bilateral Distance:    Right Eye Near:   Left Eye Near:    Bilateral Near:     Physical Exam Vitals and nursing note reviewed.  Constitutional:      Appearance: Normal appearance. She is ill-appearing. She is not toxic-appearing.  HENT:     Head: Normocephalic and atraumatic.     Right Ear: Hearing, tympanic membrane, ear canal and external ear normal.     Left Ear: Hearing, tympanic membrane, ear canal and external ear normal.     Nose: Congestion and rhinorrhea present.     Mouth/Throat:     Lips: Pink.     Mouth: Mucous membranes are moist.     Pharynx: Posterior oropharyngeal erythema present. No oropharyngeal exudate.     Comments: Clear postnasal drainage present to the posterior oropharynx. Eyes:     General: Lids are normal. Vision grossly intact. Gaze aligned appropriately.     Extraocular Movements: Extraocular movements intact.     Conjunctiva/sclera: Conjunctivae normal.  Cardiovascular:     Rate and Rhythm: Normal rate and regular rhythm.     Heart sounds: Normal heart sounds, S1 normal and S2 normal.  Pulmonary:     Effort: Pulmonary effort is normal. No respiratory distress.     Breath sounds: Normal breath sounds and air entry.     Comments: No adventitious lung sounds heard to auscultation bilaterally.  No respiratory distress. Musculoskeletal:     Cervical back: Neck supple.  Skin:    General: Skin is warm and dry.     Capillary Refill: Capillary refill takes less than 2 seconds.     Findings: No rash.  Neurological:     General: No focal deficit present.     Mental Status: She is alert and oriented to person, place, and time. Mental status is at baseline.     Cranial Nerves: No dysarthria or facial asymmetry.     Motor: No weakness.     Gait: Gait normal.  Psychiatric:        Mood and Affect: Mood normal.        Speech: Speech normal.        Behavior: Behavior normal.        Thought Content: Thought  content normal.        Judgment: Judgment normal.      UC Treatments / Results  Labs (all labs ordered are listed, but only abnormal results are displayed) Labs Reviewed  RESP PANEL BY RT-PCR (FLU A&B, COVID) [id number]    EKG   Radiology No results found.  Procedures Procedures (including critical care time)  Medications Ordered in UC Medications  ibuprofen (ADVIL) tablet 800 mg (800 mg Oral Given 04/20/22 1315)    Initial Impression / Assessment and Plan / UC Course  I have reviewed the triage vital signs and the nursing notes.  Pertinent labs & imaging results that were available during my care of the patient were reviewed by me and considered in my medical decision making (see chart for details).   1.  Viral URI with cough Symptoms and physical exam consistent with a viral upper respiratory tract infection that will likely resolve with rest, fluids, and prescriptions for symptomatic relief.  COVID-19 testing is pending.  We will call patient if this is positive.  Guaifenesin and tessalon pearles sent to the pharmacy to be used for nasal congestion, cough, and body aches, fever/chills, and discomfort associated with viral illness. Tylenol and ibuprofen may be used as well for symptoms  every 6 hours. Ibuprofen '800mg'$  given in clinic today for viral symptoms. Nonpharmacologic interventions for symptom relief provided and after visit summary below.  Strict ED/urgent care return precautions given.  Patient verbalizes understanding and agreement with plan.  Counseled patient regarding possible side effects and uses of all medications prescribed at today's visit.  Patient verbalizes understanding and agreement with plan.  All questions answered.  Patient discharged from urgent care in stable condition.    COVID-19 testing is pending and patient is a candidate for Paxlovid prescription if COVID-19 test is positive due to comorbidities.  Discussed physical exam and available lab work  findings in clinic with patient.  Counseled patient regarding appropriate use of medications and potential side effects for all medications recommended or prescribed today. Discussed red flag signs and symptoms of worsening condition,when to call the PCP office, return to urgent care, and when to seek higher level of care in the emergency department. Patient verbalizes understanding and agreement with plan. All questions answered. Patient discharged in stable condition.      Final Clinical Impressions(s) / UC Diagnoses   Final diagnoses:  Viral URI with cough  Sore throat  Bad headache     Discharge Instructions      You have a viral upper respiratory infection.  COVID and flu testing are pending.  We will call you if your result is positive and prescribed antiviral therapy at that time if necessary.  Stay at home until your testing comes back to prevent spread of viral illness to others.  Your work note was at the end your packet.  Take guaifenesin '1200mg'$   2 times daily to thin your mucous so that you can cough it up and blow it out of your nose easier. Drink plenty of water while taking this medication so that it works well in your body (at least 8 cups a day).   Take tessalon pearles every 8 hours as needed for cough.  You may take tylenol 1,'000mg'$  and ibuprofen '600mg'$  every 6 hours with food as needed for fever/chills, sore throat, aches/pains, and inflammation associated with viral illness. Take this with food to avoid stomach upset.    You may do salt water and baking soda gargles every 4 hours as needed for your throat pain.  Please put 1 teaspoon of salt and 1/2 teaspoon of baking soda in 8 ounces of warm water then gargle and spit the water out. You may also put 1 tablespoon of honey in warm water and drink this to soothe your throat.  Place a humidifier in your room at night to help decrease dry air that can irritate your airway and cause you to have a sore throat and cough.   Please try to eat a well-balanced diet while you are sick so that your body gets proper nutrition to heal.  If you develop any new or worsening symptoms, please return.  If your symptoms are severe, please go to the emergency room.  Follow-up with your primary care provider for further evaluation and management of your symptoms as well as ongoing wellness visits.  I hope you feel better!      ED Prescriptions     Medication Sig Dispense Auth. Provider   benzonatate (TESSALON) 100 MG capsule Take 1 capsule (100 mg total) by mouth every 8 (eight) hours. 21 capsule Joella Prince M, FNP   Guaifenesin 1200 MG TB12 Take 1 tablet (1,200 mg total) by mouth in the morning and at bedtime. 14 tablet Joella Prince  M, FNP      PDMP not reviewed this encounter.   Talbot Grumbling, [place] 04/20/22 1354

## 2022-04-20 NOTE — Discharge Instructions (Signed)
You have a viral upper respiratory infection.  COVID and flu testing are pending.  We will call you if your result is positive and prescribed antiviral therapy at that time if necessary.  Stay at home until your testing comes back to prevent spread of viral illness to others.  Your work note was at the end your packet.  Take guaifenesin '1200mg'$   2 times daily to thin your mucous so that you can cough it up and blow it out of your nose easier. Drink plenty of water while taking this medication so that it works well in your body (at least 8 cups a day).   Take tessalon pearles every 8 hours as needed for cough.  You may take tylenol 1,'000mg'$  and ibuprofen '600mg'$  every 6 hours with food as needed for fever/chills, sore throat, aches/pains, and inflammation associated with viral illness. Take this with food to avoid stomach upset.    You may do salt water and baking soda gargles every 4 hours as needed for your throat pain.  Please put 1 teaspoon of salt and 1/2 teaspoon of baking soda in 8 ounces of warm water then gargle and spit the water out. You may also put 1 tablespoon of honey in warm water and drink this to soothe your throat.  Place a humidifier in your room at night to help decrease dry air that can irritate your airway and cause you to have a sore throat and cough.  Please try to eat a well-balanced diet while you are sick so that your body gets proper nutrition to heal.  If you develop any new or worsening symptoms, please return.  If your symptoms are severe, please go to the emergency room.  Follow-up with your primary care provider for further evaluation and management of your symptoms as well as ongoing wellness visits.  I hope you feel better!

## 2022-04-21 ENCOUNTER — Other Ambulatory Visit: Payer: Self-pay | Admitting: Internal Medicine

## 2022-04-21 ENCOUNTER — Telehealth (HOSPITAL_COMMUNITY): Payer: Self-pay

## 2022-04-21 MED ORDER — NIRMATRELVIR/RITONAVIR (PAXLOVID)TABLET: 3.0000 | ORAL_TABLET | Freq: Two times a day (BID) | ORAL | 0 refills | Status: AC

## 2022-04-27 ENCOUNTER — Ambulatory Visit (HOSPITAL_COMMUNITY)
Admission: EM | Admit: 2022-04-27 | Discharge: 2022-04-27 | Disposition: A | Discharge status: Home / Self Care | Payer: BC Managed Care – PPO | Attending: Family Medicine | Admitting: Family Medicine

## 2022-04-27 ENCOUNTER — Encounter (HOSPITAL_COMMUNITY): Payer: Self-pay | Admitting: Emergency Medicine

## 2022-04-27 DIAGNOSIS — U071 COVID-19: Secondary | ICD-10-CM | POA: Diagnosis not present

## 2022-04-27 NOTE — Discharge Instructions (Signed)
You were seen today for follow up covid 19 and note for work.  Note is included.  I recommend rest, and fluids in the next several days.  Please return if you are feeling worse in the next week or so.

## 2022-04-27 NOTE — ED Triage Notes (Signed)
Pt was seen here on 9/25 and was called on 9/26 and told has Covid. Went back to work and was sent home yesterday due to cough. Reports that needing a note to be out til 10/4.

## 2022-04-27 NOTE — ED Provider Notes (Signed)
[facility]    CSN: [id number] Arrival date & time: 04/27/22  1401      History   Chief Complaint Chief Complaint  Patient presents with   Cough   Letter for School/Work    HPI [name] is a 56 y.o. female.   Patient is here for covid.  She was seen on 9/25, and dx with covid.  She has been out of work since then.  She went to work last night, but sent home due to cough.  Per FMLA, would like  a note keeping her out of work and to go back on 10/4.  She is overall feeling better.  Still with some cough and sneezing.  Some headache as well.    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   Chest pain    CHEST PAIN 11/13/2008   Qualifier: Diagnosis of  By: Darrick Meigs     Chronic anemia    Difficulty sleeping    Dysmenorrhea 2011   Dysrhythmia    irregular heart beats   Fibroids 09/20/2013   Heart palpitations    History of abdominal hysterectomy 03/2014   History of uterine fibroid    Menorrhagia    Palpitations 09/23/2006   Qualifier: Diagnosis of  By: Benna Dunks     Shortness of breath    "associated w/high heart rate" (01/04/2013)   Supraventricular tachycardia    SUPRAVENTRICULAR TACHYCARDIA 11/13/2008   Qualifier: Diagnosis of  By: Darrick Meigs      Patient Active Problem List   Diagnosis Date Noted   Atrial tachycardia 11/29/2020   Hypertension 11/17/2019   Encounter for annual routine gynecological examination 09/30/2016   Post-operative state 03/27/2014   Fibroids 09/20/2013   Dysmenorrhea 11/17/2012   ALCOHOL USE 11/13/2008   SUPRAVENTRICULAR TACHYCARDIA 11/13/2008   CHEST PAIN 11/13/2008   PALPITATIONS 09/23/2006    Past Surgical History:  Procedure Laterality Date   ABDOMINAL HYSTERECTOMY     AUGMENTATION MAMMAPLASTY Bilateral    CARDIAC ELECTROPHYSIOLOGY MAPPING AND ABLATION  ~ 2008; 01/04/2013   "weren't  able to do the ablation" (01/04/2013)   COLONOSCOPY  12/12/2013   COMBINED AUGMENTATION MAMMAPLASTY AND ABDOMINOPLASTY Bilateral 2002   ELECTROPHYSIOLOGY STUDY N/A 01/04/2013   Procedure: ELECTROPHYSIOLOGY STUDY;  Surgeon: Evans Lance, MD;  Location: [facility] CATH LAB;  Service: Cardiovascular;  Laterality: N/A;   SUPRAVENTRICULAR TACHYCARDIA ABLATION N/A 01/04/2013   Procedure: SUPRAVENTRICULAR TACHYCARDIA ABLATION;  Surgeon: Evans Lance, MD;  Location: [facility] CATH LAB;  Service: Cardiovascular;  Laterality: N/A;   TUBAL LIGATION     UTERINE FIBROID SURGERY  ?09/2012   VAGINAL HYSTERECTOMY Bilateral 03/27/2014   Procedure: HYSTERECTOMY VAGINAL WITH BILATERAL SALPINGECTOMY;  Surgeon: Lavonia Drafts, MD;  Location: [facility];  Service: Gynecology;  Laterality: Bilateral;    OB History     Gravida  3   Para  3   Term  3   Preterm  0   AB  0   Living  4      SAB  0   IAB  0   Ectopic  0   Multiple  1   Live Births               Home Medications    Prior to Admission medications   Medication Sig Start Date End Date Taking? Authorizing Provider  benzonatate (  TESSALON) 100 MG capsule Take 1 capsule (100 mg total) by mouth every 8 (eight) hours. 04/20/22   Talbot Grumbling, FNP  Guaifenesin 1200 MG TB12 Take 1 tablet (1,200 mg total) by mouth in the morning and at bedtime. 04/20/22   Talbot Grumbling, FNP  metoprolol succinate (TOPROL-XL) 25 MG 24 hr tablet TAKE 1 TABLET (25 MG TOTAL) BY MOUTH DAILY. 04/21/22   Jerline Pain, MD  predniSONE (STERAPRED UNI-PAK 21 TAB) 10 MG (21) TBPK tablet As directed 05/07/21   Raspet, Derry Skill, PA-C    Family History Family History  Problem Relation Age of Onset   Cancer Brother    Cancer Paternal Aunt        x 2 unkown type   Breast cancer Paternal Aunt    Heart disease Maternal Grandmother    Heart Problems Mother        apid heartbeat   Breast cancer Sister 49   Breast cancer Paternal Grandmother    Colon cancer Neg  Hx     Social History Social History   Tobacco Use   Smoking status: Former    Packs/day: 0.33    Years: 3.00    Total pack years: 0.99    Types: Cigarettes    Quit date: 12/27/1984    Years since quitting: 37.3   Smokeless tobacco: Never  Vaping Use   Vaping Use: Never used  Substance Use Topics   Alcohol use: Not Currently   Drug use: Not Currently    Types: Marijuana    Comment: last day used 2 weeks      Allergies   Patient has no known allergies.   Review of Systems Review of Systems  Constitutional:  Positive for fatigue.  HENT:  Positive for congestion.   Respiratory:  Positive for cough.   Genitourinary: Negative.   Musculoskeletal:  Positive for arthralgias and myalgias.  Skin: Negative.   Psychiatric/Behavioral: Negative.       Physical Exam Triage Vital Signs ED Triage Vitals  Enc Vitals Group     BP 04/27/22 1433 124/85     Pulse Rate 04/27/22 1433 90     Resp 04/27/22 1433 17     Temp 04/27/22 1433 98.5 F (36.9 C)     Temp Source 04/27/22 1433 Oral     SpO2 04/27/22 1433 98 %     Weight --      Height --      Head Circumference --      Peak Flow --      Pain Score 04/27/22 1432 7     Pain Loc --      Pain Edu? --      Excl. in [place]? --    No data found.  Updated Vital Signs BP 124/85 (BP Location: Left Arm)   Pulse 90   Temp 98.5 F (36.9 C) (Oral)   Resp 17   LMP 03/15/2014 (Exact Date)   SpO2 98%   Visual Acuity Right Eye Distance:   Left Eye Distance:   Bilateral Distance:    Right Eye Near:   Left Eye Near:    Bilateral Near:     Physical Exam Constitutional:      Appearance: Normal appearance.  HENT:     Head: Normocephalic.     Nose: Congestion present.  Cardiovascular:     Rate and Rhythm: Normal rate and regular rhythm.  Pulmonary:     Effort: Pulmonary effort is normal.  Breath sounds: Normal breath sounds.     Comments: + cough with inspiration Musculoskeletal:     Cervical back: Normal range of  motion.  Skin:    General: Skin is warm.  Neurological:     General: No focal deficit present.     Mental Status: She is alert.  Psychiatric:        Mood and Affect: Mood normal.      UC Treatments / Results  Labs (all labs ordered are listed, but only abnormal results are displayed) Labs Reviewed - No data to display  EKG   Radiology No results found.  Procedures Procedures (including critical care time)  Medications Ordered in UC Medications - No data to display  Initial Impression / Assessment and Plan / UC Course  I have reviewed the triage vital signs and the nursing notes.  Pertinent labs & imaging results that were available during my care of the patient were reviewed by me and considered in my medical decision making (see chart for details).   Final Clinical Impressions(s) / UC Diagnoses   Final diagnoses:  [id number]     Discharge Instructions      You were seen today for follow up covid 19 and note for work.  Note is included.  I recommend rest, and fluids in the next several days.  Please return if you are feeling worse in the next week or so.     ED Prescriptions   None    PDMP not reviewed this encounter.   Rondel Oh, MD 04/27/22 [phone]

## 2022-05-11 ENCOUNTER — Ambulatory Visit: Payer: BC Managed Care – PPO | Admitting: Internal Medicine

## 2022-05-15 ENCOUNTER — Encounter: Payer: Self-pay | Admitting: Internal Medicine

## 2022-05-15 ENCOUNTER — Ambulatory Visit: Payer: BC Managed Care – PPO | Attending: Internal Medicine | Admitting: Internal Medicine

## 2022-05-15 VITALS — BP 132/88 | HR 72 | Ht 59.0 in | Wt 156.0 lb

## 2022-05-15 DIAGNOSIS — I471 Supraventricular tachycardia, unspecified: Secondary | ICD-10-CM

## 2022-05-15 MED ORDER — METOPROLOL SUCCINATE ER 25 MG PO TB24: 25.0000 mg | ORAL_TABLET | Freq: Two times a day (BID) | ORAL | 3 refills | Status: DC

## 2022-05-15 NOTE — Patient Instructions (Addendum)
Medication Instructions:  Your physician has recommended you make the following change in your medication:  INCREASE Metoprolol Succinate (Toprol) to 25 mg twice daily  *If you need a refill on your cardiac medications before your next appointment, please call your pharmacy*   Lab Work: None ordered If you have labs (blood work) drawn today and your tests are completely normal, you will receive your results only by: [facility] (if you have MyChart) OR A paper copy in the mail If you have any lab test that is abnormal or we need to change your treatment, we will call you to review the results.   Testing/Procedures: None ordered   Follow-Up: At [facility], you and your health needs are our priority.  As part of our continuing mission to provide you with exceptional heart care, we have created designated Provider Care Teams.  These Care Teams include your primary Cardiologist (physician) and Advanced Practice Providers (APPs -  Physician Assistants and Nurse Practitioners) who all work together to provide you with the care you need, when you need it.  We recommend signing up for the patient portal called "MyChart".  Sign up information is provided on this After Visit Summary.  MyChart is used to connect with patients for Virtual Visits (Telemedicine).  Patients are able to view lab/test results, encounter notes, upcoming appointments, etc.  Non-urgent messages can be sent to your provider as well.   To learn more about what you can do with MyChart, go to [email or web address].    Your next appointment:   1 year(s)  The format for your next appointment:   In Person  Provider:   Cristopher Peru, MD    Thank you for choosing [facility]!!   [phone]  Other Instructions   Important Information About Sugar

## 2022-05-15 NOTE — Progress Notes (Signed)
HPI [name] returns today for followup of palpitations. She has a h/o SVT and underwent remote ablation. 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 She has been taking long acting toprol. She notes occasional peripheral edema. She has occasional break throughs but overall has felt well.  No Known Allergies   Current Outpatient Medications  Medication Sig Dispense Refill   metoprolol succinate (TOPROL-XL) 25 MG 24 hr tablet TAKE 1 TABLET (25 MG TOTAL) BY MOUTH DAILY. 15 tablet 0   No current facility-administered medications for this visit.     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   Chest pain    CHEST PAIN 11/13/2008   Qualifier: Diagnosis of  By: Darrick Meigs     Chronic anemia    Difficulty sleeping    Dysmenorrhea 2011   Dysrhythmia    irregular heart beats   Fibroids 09/20/2013   Heart palpitations    History of abdominal hysterectomy 03/2014   History of uterine fibroid    Menorrhagia    Palpitations 09/23/2006   Qualifier: Diagnosis of  By: Benna Dunks     Shortness of breath    "associated w/high heart rate" (01/04/2013)   Supraventricular tachycardia    SUPRAVENTRICULAR TACHYCARDIA 11/13/2008   Qualifier: Diagnosis of  By: Darrick Meigs      ROS:   All systems reviewed and negative except as noted in the HPI.   Past Surgical History:  Procedure Laterality Date   ABDOMINAL HYSTERECTOMY     AUGMENTATION MAMMAPLASTY Bilateral    CARDIAC ELECTROPHYSIOLOGY MAPPING AND ABLATION  ~ 2008; 01/04/2013   "weren't able to do the ablation" (01/04/2013)   COLONOSCOPY  12/12/2013   COMBINED AUGMENTATION MAMMAPLASTY AND ABDOMINOPLASTY Bilateral 2002   ELECTROPHYSIOLOGY STUDY N/A 01/04/2013   Procedure: ELECTROPHYSIOLOGY STUDY;  Surgeon: Evans Lance, MD;  Location: [facility] CATH LAB;  Service: Cardiovascular;  Laterality: N/A;    SUPRAVENTRICULAR TACHYCARDIA ABLATION N/A 01/04/2013   Procedure: SUPRAVENTRICULAR TACHYCARDIA ABLATION;  Surgeon: Evans Lance, MD;  Location: [facility] CATH LAB;  Service: Cardiovascular;  Laterality: N/A;   TUBAL LIGATION     UTERINE FIBROID SURGERY  ?09/2012   VAGINAL HYSTERECTOMY Bilateral 03/27/2014   Procedure: HYSTERECTOMY VAGINAL WITH BILATERAL SALPINGECTOMY;  Surgeon: Lavonia Drafts, MD;  Location: [facility];  Service: Gynecology;  Laterality: Bilateral;     Family History  Problem Relation Age of Onset   Cancer Brother    Cancer Paternal Aunt        x 2 unkown type   Breast cancer Paternal Aunt    Heart disease Maternal Grandmother    Heart Problems Mother        apid heartbeat   Breast cancer Sister 8   Breast cancer Paternal Grandmother    Colon cancer Neg Hx      Social History   Socioeconomic History   Marital status: Legally Separated    Spouse name: Not on file   Number of children: 4   Years of education: Not on file   Highest education level: Not on file  Occupational History   Occupation: order IT consultant: [employer]  Tobacco Use   Smoking status: Former    Packs/day: 0.33    Years: 3.00    Total pack years: 0.99    Types: Cigarettes  Quit date: 12/27/1984    Years since quitting: 37.4   Smokeless tobacco: Never  Vaping Use   Vaping Use: Never used  Substance and Sexual Activity   Alcohol use: Not Currently   Drug use: Not Currently    Types: Marijuana    Comment: last day used 2 weeks    Sexual activity: Yes    Birth control/protection: None  Other Topics Concern   Not on file  Social History Narrative   Not on file   Social Determinants of Health   Financial Resource Strain: Not on file  Food Insecurity: Not on file  Transportation Needs: Not on file  Physical Activity: Not on file  Stress: Not on file  Social Connections: Not on file  Intimate Partner Violence: Not on file     BP 132/88   Pulse 72   Ht '4\' 11"'$   (1.499 m)   Wt 156 lb (70.8 kg)   LMP 03/15/2014 (Exact Date)   SpO2 97%   BMI 31.51 kg/m   Physical Exam:  Well appearing NAD HEENT: Unremarkable Neck:  No JVD, no thyromegally Lymphatics:  No adenopathy Back:  No CVA tenderness Lungs:  Clear HEART:  Regular rate rhythm, no murmurs, no rubs, no clicks Abd:  soft, positive bowel sounds, no organomegally, no rebound, no guarding Ext:  2 plus pulses, no edema, no cyanosis, no clubbing Skin:  No rashes no nodules Neuro:  CN II through XII intact, motor grossly intact  EKG - nsr   Assess/Plan:  1. Palpitations - I have recommended that she take the toprol twice daily and I have given her a new prescription for metoprolol to take for break through symptoms.  2. HTN - her bp is minimally elevated. We will follow.   Robin Overlie Haley Roza,MD

## 2022-06-28 IMAGING — MG MM  DIGITAL DIAGNOSTIC BREAST BILAT IMPLANT W/ TOMO W/ CAD
8 of 12 series · 8 of 28 positions shown · non-contrast
Comparison: Previous exams including diagnostic mammogram and
ultrasound dated 04/13/2018

CLINICAL DATA: Follow-up for probably benign mass within the LEFT
breast at the 12 o'clock axis, previously described as probably
benign cluster of minimally complicated cysts on diagnostic
mammogram and ultrasound report dated 04/13/2018 with recommendation
for 6 month follow-up. Patient returns today for the follow-up
diagnostic exam.

EXAM:
DIGITAL DIAGNOSTIC BILATERAL MAMMOGRAM WITH IMPLANTS, CAD AND
TOMOSYNTHESIS; ULTRASOUND LEFT BREAST LIMITED
TECHNIQUE: Bilateral digital diagnostic mammography and breast tomosynthesis
was performed. The images were evaluated with computer-aided
detection. Standard and/or implant displaced views were performed.;
Targeted ultrasound examination of the left breast was performed

[L MLO]
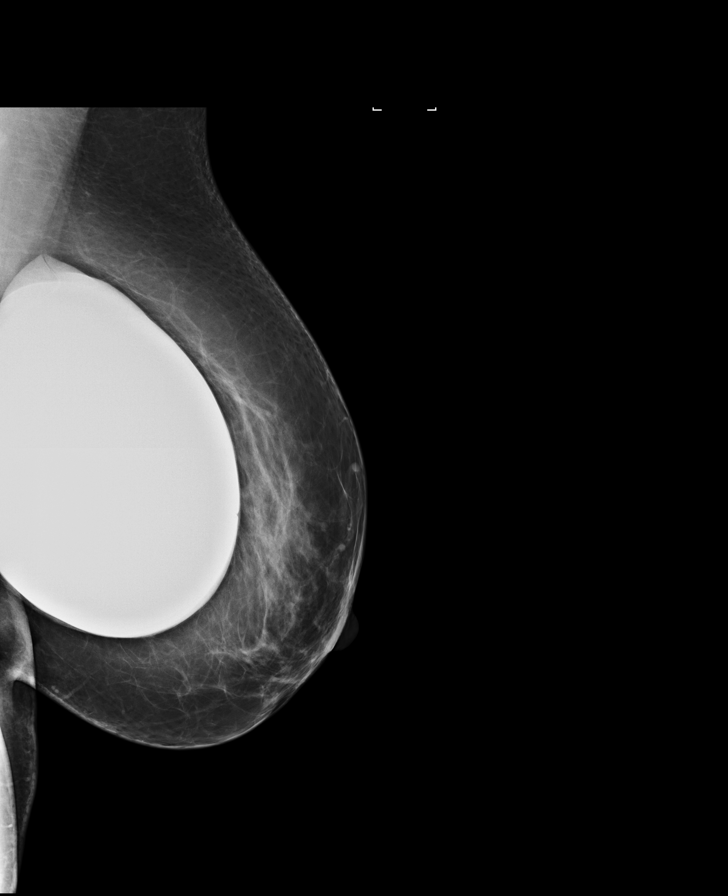

[R MLO]
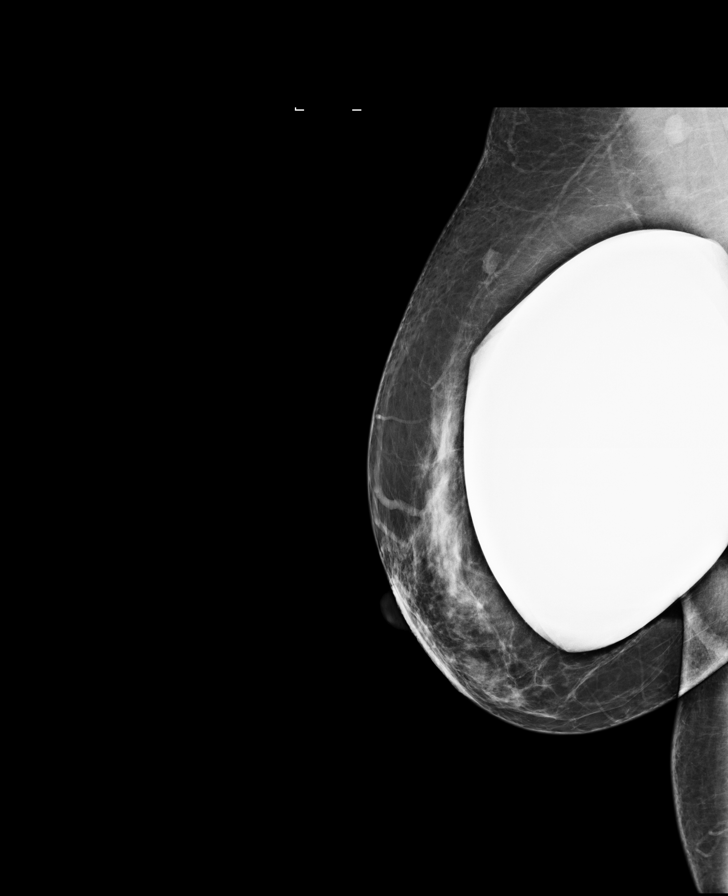

[R CC]
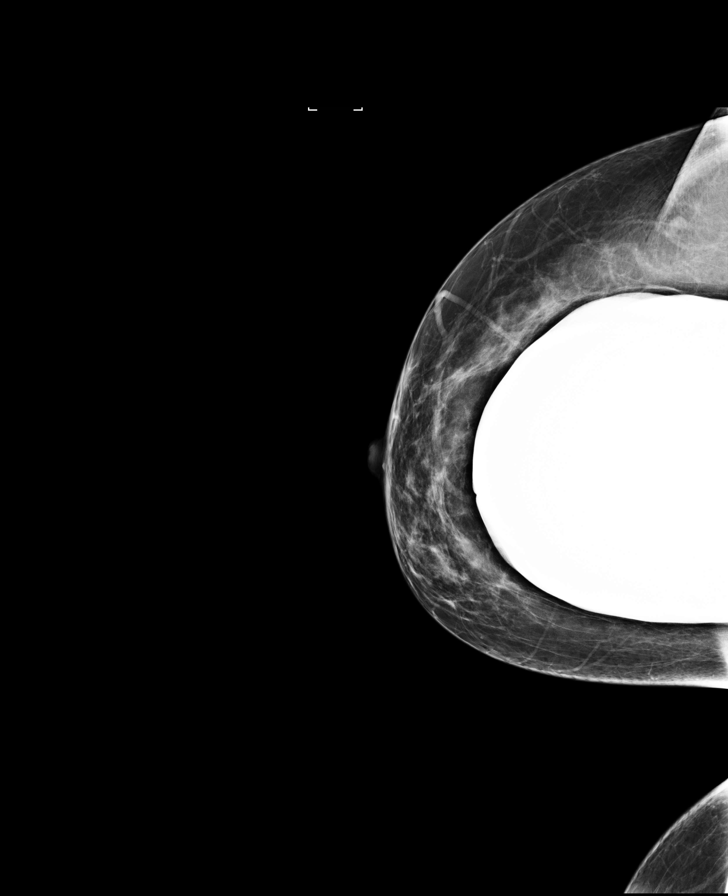

[L CC]
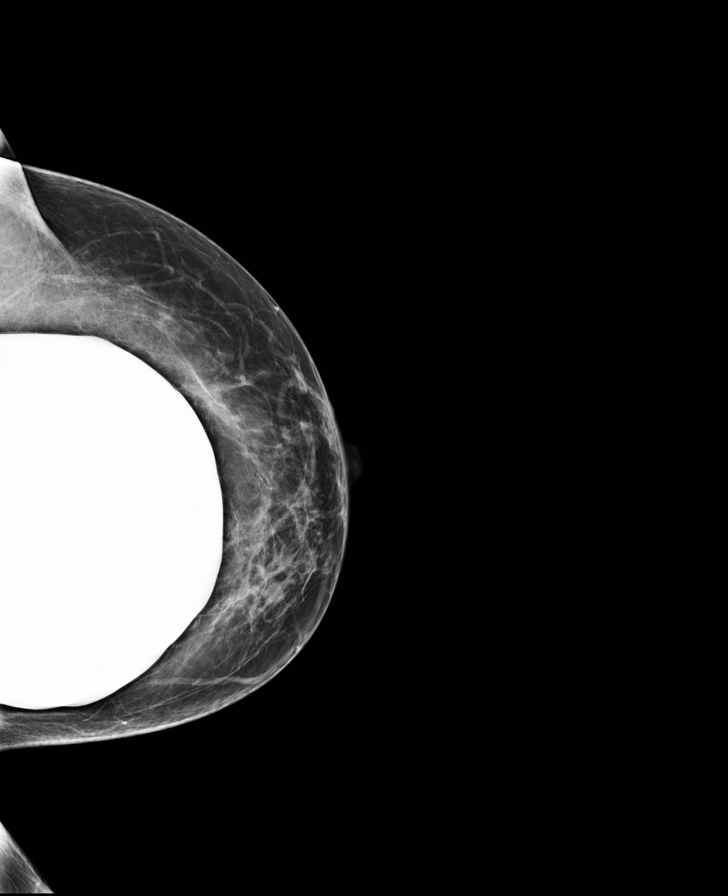

[L MLO synth-2D]
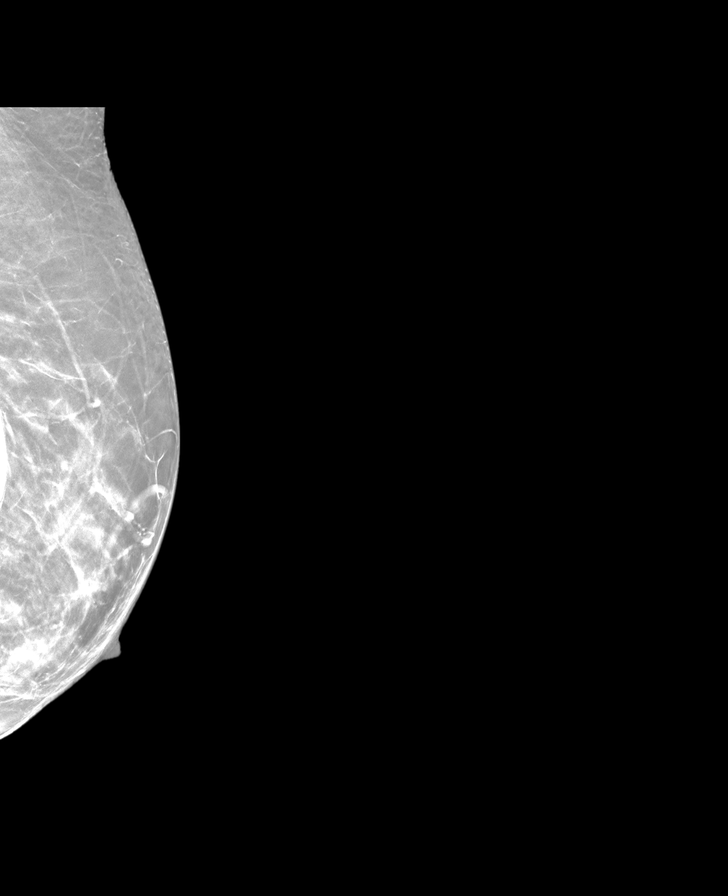

[R MLO synth-2D]
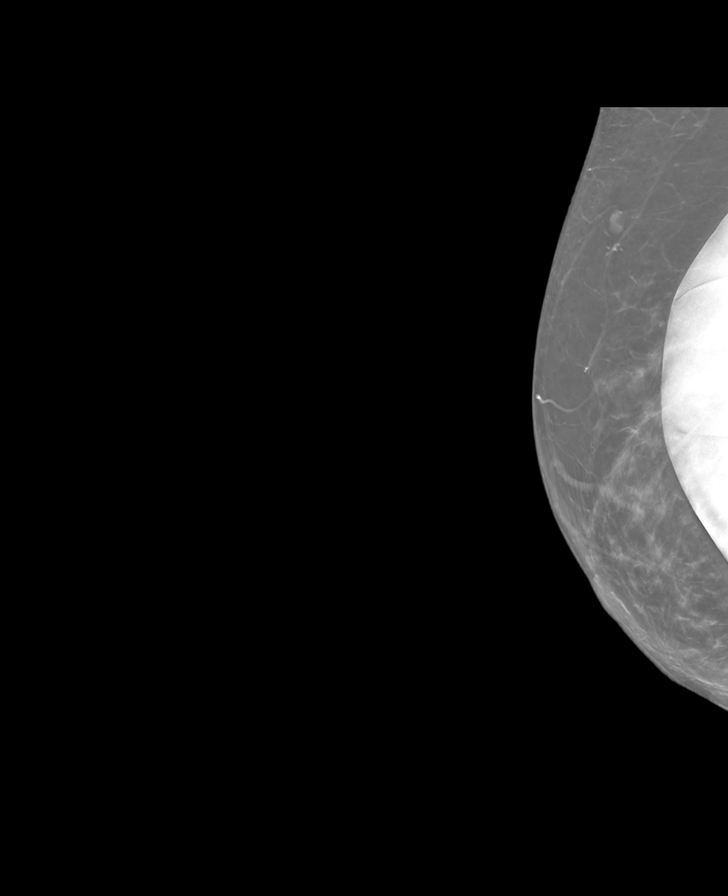

[R CC synth-2D]
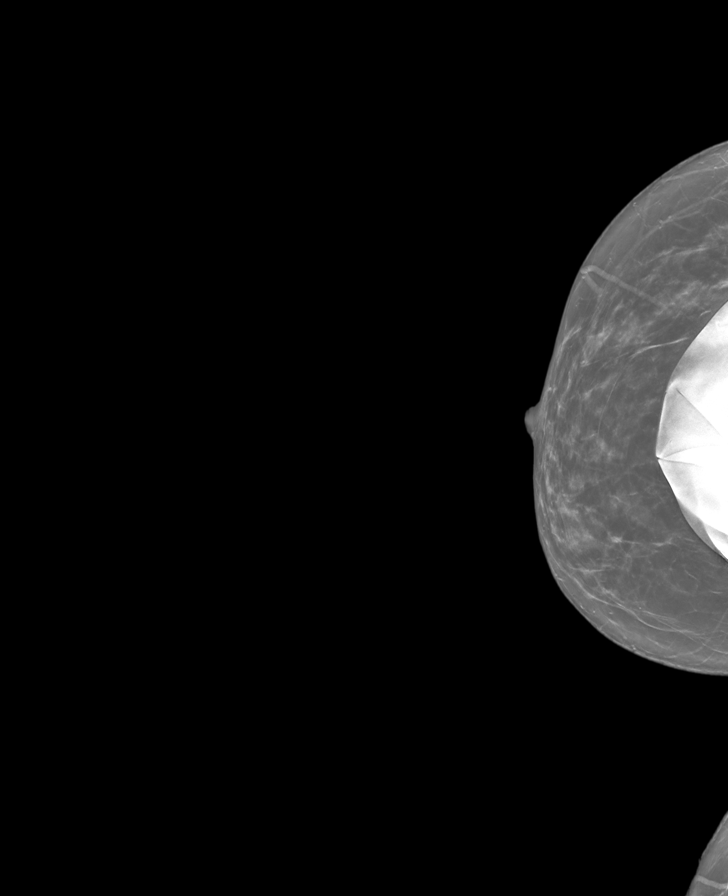

[L CC synth-2D]
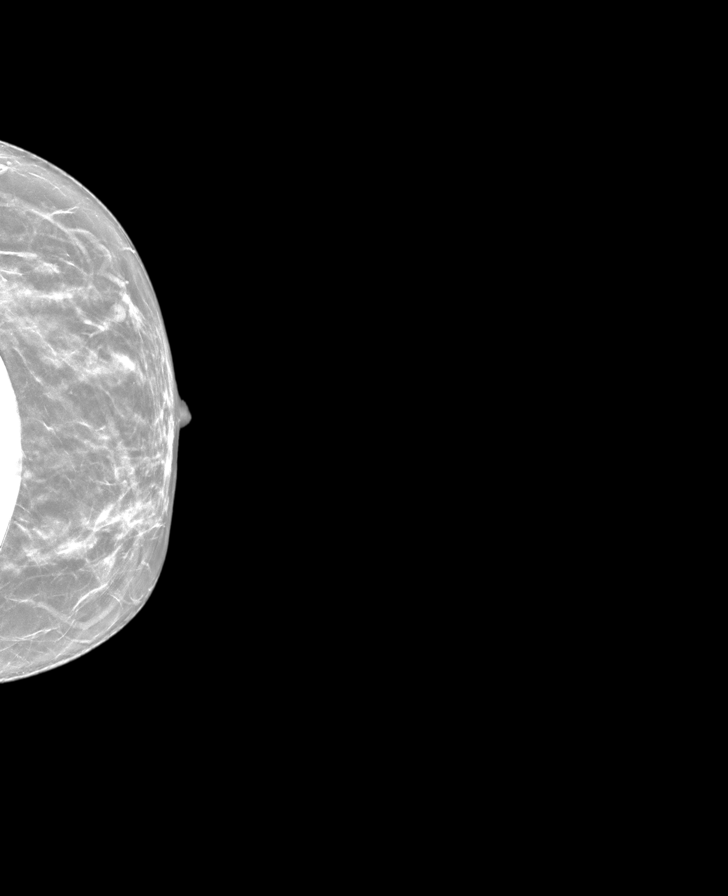

[8 of 28 positions shown; findings below may reference images not displayed]

ACR Breast Density Category c: The breast tissue is heterogeneously
dense, which may obscure small masses.
FINDINGS: Bilateral diagnostic mammogram: There are no new dominant masses,
suspicious calcifications or secondary signs of malignancy within
either breast. Previously demonstrated mass within the upper LEFT
breast is less conspicuous suggesting benignity. The patient has
prepectoral implants.

Targeted ultrasound is performed, showing a significant interval
decrease in size of the LEFT breast mass at the 12 o'clock axis, 2
cm from the nipple, now measuring 3 mm greatest dimension,
previously 8 mm, confirming benignity, presumed cyst.
IMPRESSION: 1. No evidence of malignancy within either breast.
2. Benign mass within the LEFT breast at the 12 o'clock axis,
decreased in size compared to the previous ultrasound of 04/13/2018
confirming benignity, presumed cyst. No additional follow-up imaging
is necessary for this benign finding

Patient may return to routine annual bilateral screening mammogram
schedule.

RECOMMENDATION:
Screening mammogram in one year.(Code:JQ-Z-0CC)

I have discussed the findings and recommendations with the patient.
If applicable, a reminder letter will be sent to the patient
regarding the next appointment.

BI-RADS CATEGORY  2: Benign.

## 2022-06-28 IMAGING — US US BREAST*L* LIMITED INC AXILLA
1 series · 6 of 6 positions shown · non-contrast
Comparison: Previous exams including diagnostic mammogram and
ultrasound dated 04/13/2018

CLINICAL DATA: Follow-up for probably benign mass within the LEFT
breast at the 12 o'clock axis, previously described as probably
benign cluster of minimally complicated cysts on diagnostic
mammogram and ultrasound report dated 04/13/2018 with recommendation
for 6 month follow-up. Patient returns today for the follow-up
diagnostic exam.

EXAM:
DIGITAL DIAGNOSTIC BILATERAL MAMMOGRAM WITH IMPLANTS, CAD AND
TOMOSYNTHESIS; ULTRASOUND LEFT BREAST LIMITED
TECHNIQUE: Bilateral digital diagnostic mammography and breast tomosynthesis
was performed. The images were evaluated with computer-aided
detection. Standard and/or implant displaced views were performed.;
Targeted ultrasound examination of the left breast was performed

[Series 1: us breast*left* limited inc axilla · 0.05mm/px · 6 of 6 slices shown]
[im 1/6]
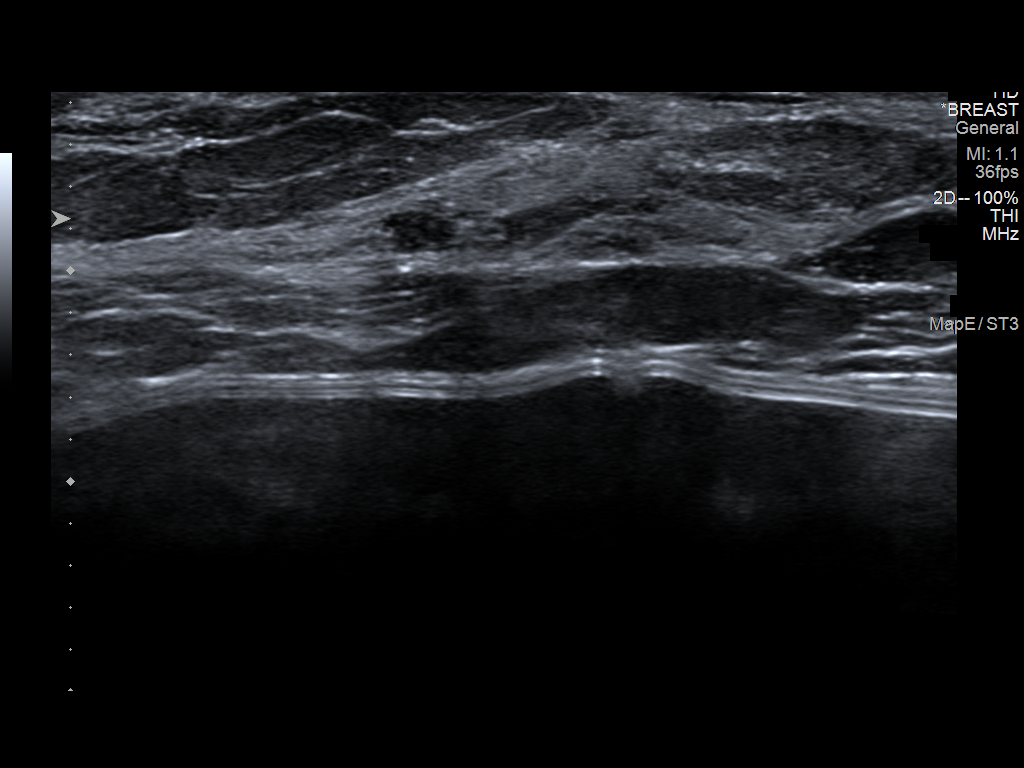
[im 2/6]
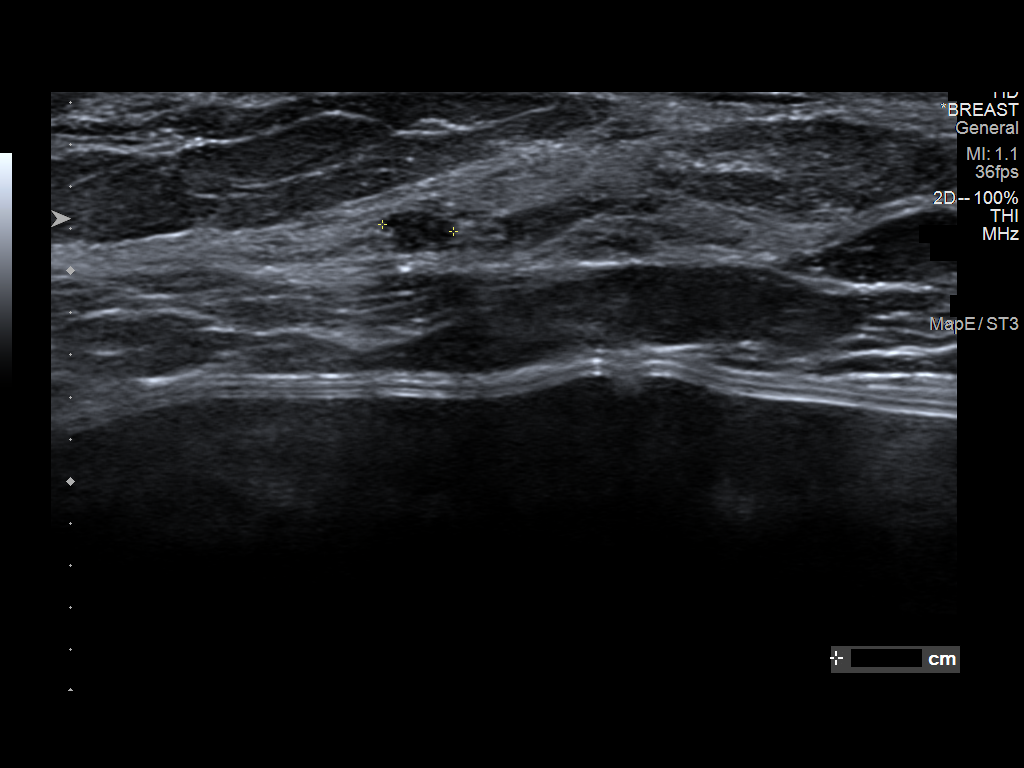
[im 3/6]
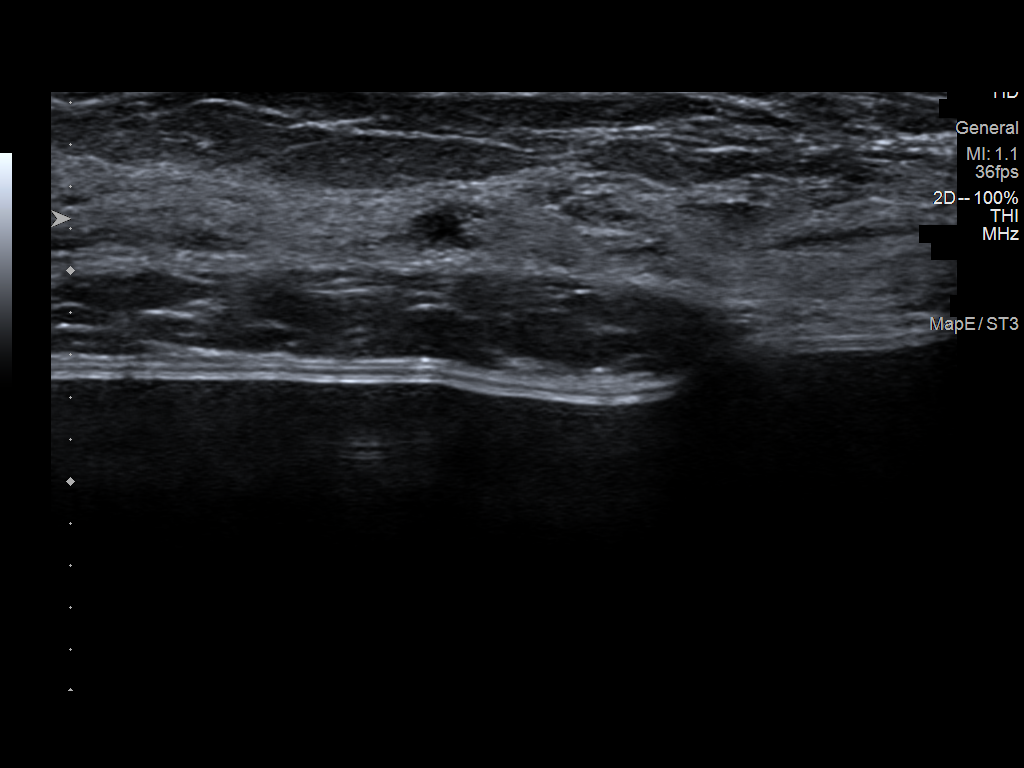
[im 4/6]
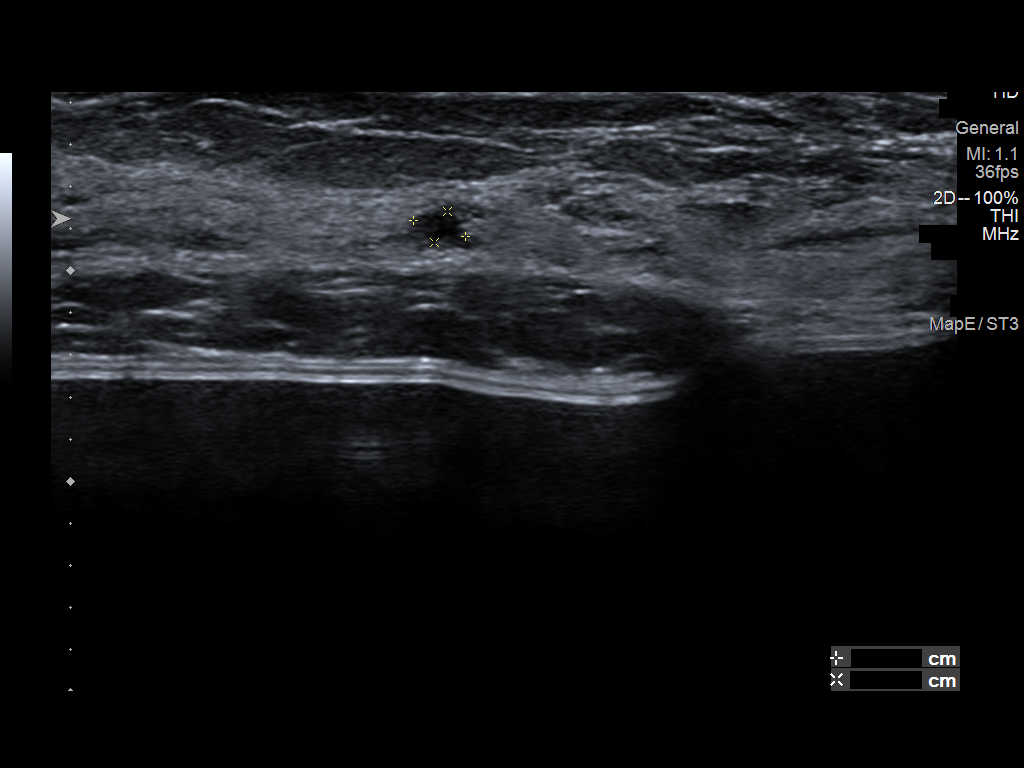
[im 5/6]
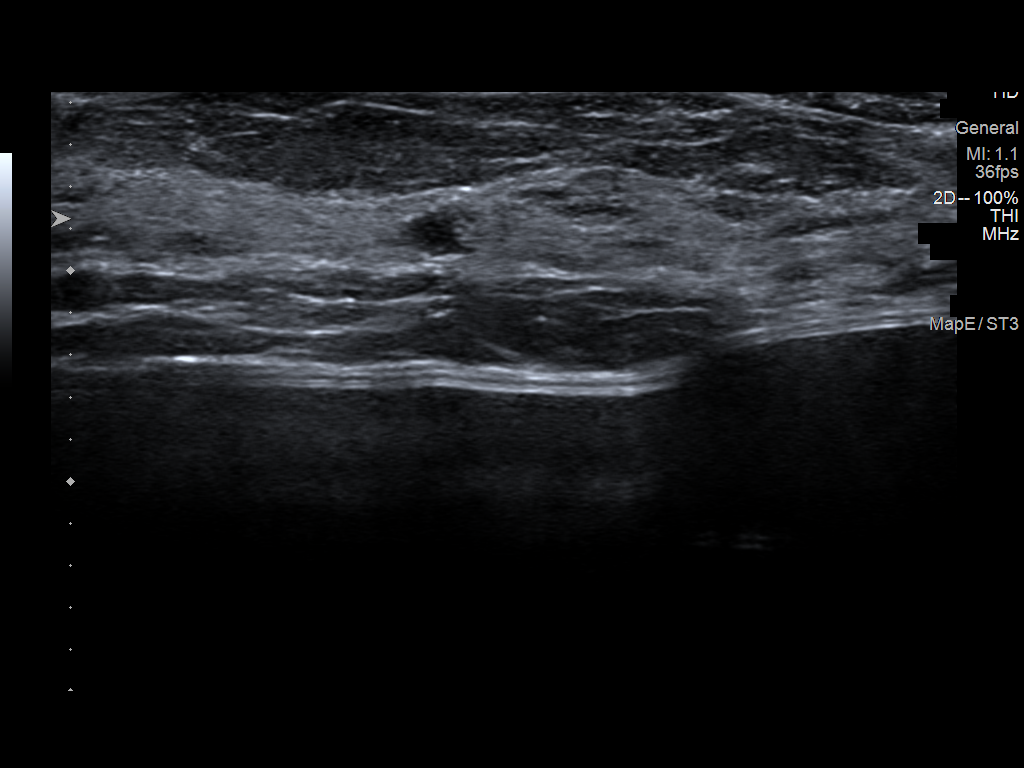
[im 6/6]
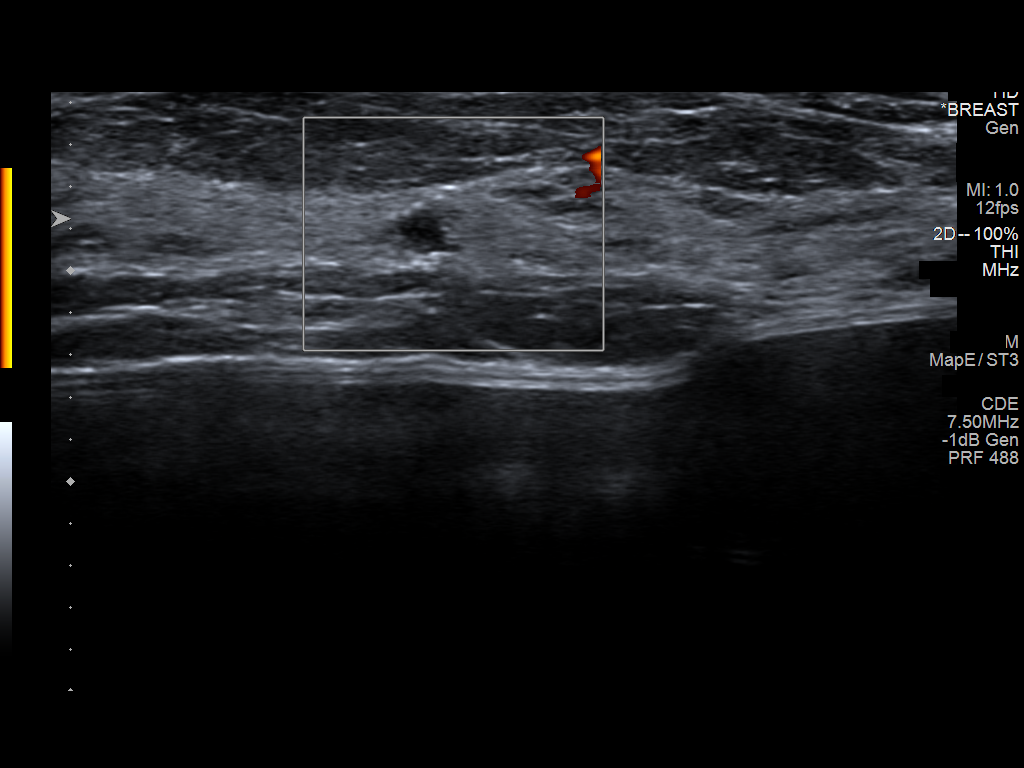

[6 of 6 positions shown; findings below may reference images not displayed]

ACR Breast Density Category c: The breast tissue is heterogeneously
dense, which may obscure small masses.
FINDINGS: Bilateral diagnostic mammogram: There are no new dominant masses,
suspicious calcifications or secondary signs of malignancy within
either breast. Previously demonstrated mass within the upper LEFT
breast is less conspicuous suggesting benignity. The patient has
prepectoral implants.

Targeted ultrasound is performed, showing a significant interval
decrease in size of the LEFT breast mass at the 12 o'clock axis, 2
cm from the nipple, now measuring 3 mm greatest dimension,
previously 8 mm, confirming benignity, presumed cyst.
IMPRESSION: 1. No evidence of malignancy within either breast.
2. Benign mass within the LEFT breast at the 12 o'clock axis,
decreased in size compared to the previous ultrasound of 04/13/2018
confirming benignity, presumed cyst. No additional follow-up imaging
is necessary for this benign finding

Patient may return to routine annual bilateral screening mammogram
schedule.

RECOMMENDATION:
Screening mammogram in one year.(Code:[id number])

I have discussed the findings and recommendations with the patient.
If applicable, a reminder letter will be sent to the patient
regarding the next appointment.

BI-RADS CATEGORY  2: Benign.

## 2022-11-20 ENCOUNTER — Encounter: Payer: Self-pay | Admitting: Nurse Practitioner

## 2022-11-20 ENCOUNTER — Ambulatory Visit (INDEPENDENT_AMBULATORY_CARE_PROVIDER_SITE_OTHER): Payer: BC Managed Care – PPO | Admitting: Nurse Practitioner

## 2022-11-20 VITALS — BP 127/78 | HR 66 | Temp 97.4°F | Ht 59.0 in | Wt 161.8 lb

## 2022-11-20 DIAGNOSIS — I471 Supraventricular tachycardia, unspecified: Secondary | ICD-10-CM

## 2022-11-20 DIAGNOSIS — Z0000 Encounter for general adult medical examination without abnormal findings: Secondary | ICD-10-CM | POA: Diagnosis not present

## 2022-11-20 DIAGNOSIS — I498 Other specified cardiac arrhythmias: Secondary | ICD-10-CM

## 2022-11-20 DIAGNOSIS — R1012 Left upper quadrant pain: Secondary | ICD-10-CM | POA: Insufficient documentation

## 2022-11-20 DIAGNOSIS — Z1329 Encounter for screening for other suspected endocrine disorder: Secondary | ICD-10-CM | POA: Diagnosis not present

## 2022-11-20 DIAGNOSIS — A6000 Herpesviral infection of urogenital system, unspecified: Secondary | ICD-10-CM | POA: Insufficient documentation

## 2022-11-20 DIAGNOSIS — Z13228 Encounter for screening for other metabolic disorders: Secondary | ICD-10-CM

## 2022-11-20 DIAGNOSIS — K219 Gastro-esophageal reflux disease without esophagitis: Secondary | ICD-10-CM | POA: Diagnosis not present

## 2022-11-20 DIAGNOSIS — Z1321 Encounter for screening for nutritional disorder: Secondary | ICD-10-CM

## 2022-11-20 DIAGNOSIS — Z130 Encounter for screening for diseases of the blood and blood-forming organs and certain disorders involving the immune mechanism: Secondary | ICD-10-CM | POA: Insufficient documentation

## 2022-11-20 MED ORDER — ACYCLOVIR 400 MG PO TABS: 400.0000 mg | ORAL_TABLET | Freq: Three times a day (TID) | ORAL | 5 refills | Status: DC

## 2022-11-20 MED ORDER — PANTOPRAZOLE SODIUM 40 MG PO TBEC: 40.0000 mg | DELAYED_RELEASE_TABLET | Freq: Every day | ORAL | 0 refills | Status: DC | PRN

## 2022-11-20 NOTE — Progress Notes (Addendum)
Complete physical exam  Patient: [name]   DOB: February 22, 1966   58 y.o. Female  MRN: [id number]  Subjective:    Chief Complaint  Patient presents with   Establish Care    [name] is a 57 y.o. female with past medical history of SVT, hypertension, fibroids who presents to establish care for chronic medical conditions today for a complete physical exam. She reports consuming a general diet.   Patient complains of left upper abdominal quadrant pain for years states that she was given medications for muscle spasm in the past which helped  but she continues to be worried about the pain.  States that the abdominal pain did not completely go away.  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  Flexeril and Aleve helpes some.  We discussed referral to GI for further evaluation.  She denies fever, chills, unintentional weight loss, nausea, vomiting   Patient complains of acid reflux, burning sensation in her throat some epigastric abdominal pain.  Currently not on medication for acid reflux  Has history of herpes her last outbreak was about 6 months ago stated that Valtrex gave her palpitations she was prescribed another medication at the urgent care at one time that helped but does not remember the name of the med  Due for Tdap vaccine, shingles vaccine.  Need for blood (discussed today patient declined vaccines.  She is up-to-date with colon cancer screening.  Breast exam declined  Most recent fall risk assessment:    11/20/2022    8:37 AM  Fall Risk   Falls in the past year? 0  Number falls in past yr: 0  Injury with Fall? 0  Risk for fall due to : No Fall Risks  Follow up Falls evaluation completed     Most recent depression screenings:    11/20/2022    8:38 AM 02/07/2021    9:39 AM  PHQ 2/9 Scores  PHQ - 2 Score 0 0  PHQ- 9 Score 11         Patient Care Team: Robin Beers, FNP as PCP - General (Nurse Practitioner) Robin Bathe, MD as PCP -  Cardiology (Cardiology)   Outpatient Medications Prior to Visit  Medication Sig   metoprolol succinate (TOPROL XL) 25 MG 24 hr tablet Take 1 tablet (25 mg total) by mouth in the morning and at bedtime.   No facility-administered medications prior to visit.    Review of Systems  Constitutional:  Negative for activity change, appetite change, chills, diaphoresis, fatigue, fever and unexpected weight change.  HENT:  Negative for congestion, dental problem, drooling, ear discharge, ear pain, facial swelling and hearing loss.   Eyes:  Negative for pain, discharge, redness and itching.  Respiratory:  Negative for cough, choking, chest tightness, shortness of breath and wheezing.   Cardiovascular:  Negative for chest pain, palpitations and leg swelling.  Gastrointestinal:  Positive for abdominal pain. Negative for abdominal distention, anal bleeding, blood in stool, constipation, diarrhea, nausea, rectal pain and vomiting.  Endocrine: Negative for cold intolerance, heat intolerance, polydipsia, polyphagia and polyuria.  Genitourinary:  Negative for difficulty urinating, dysuria, flank pain, frequency, genital sores and hematuria.  Musculoskeletal:  Negative for arthralgias, back pain, gait problem, joint swelling and myalgias.  Skin:  Negative for color change, pallor, rash and wound.  Allergic/Immunologic: Negative for environmental allergies, food allergies and immunocompromised state.  Neurological:  Negative for dizziness, tremors, seizures, syncope, facial asymmetry, speech difficulty, weakness, light-headedness, numbness and  headaches.  Hematological:  Negative for adenopathy. Does not bruise/bleed easily.  Psychiatric/Behavioral:  Negative for agitation, behavioral problems, confusion, hallucinations, self-injury and suicidal ideas. The patient is not nervous/anxious.        Objective:     BP 127/78   Pulse 66   Temp (!) 97.4 F (36.3 C)   Ht 4\' 11"  (1.499 m)   Wt 161 lb 12.8 oz  (73.4 kg)   LMP 03/15/2014 (Exact Date)   SpO2 100%   BMI 32.68 kg/m    Physical Exam Vitals and nursing note reviewed. Exam conducted with a chaperone present.  Constitutional:      General: She is not in acute distress.    Appearance: Normal appearance. She is obese. She is not ill-appearing, toxic-appearing or diaphoretic.  HENT:     Right Ear: Tympanic membrane, ear canal and external ear normal. There is no impacted cerumen.     Left Ear: Tympanic membrane, ear canal and external ear normal. There is no impacted cerumen.     Nose: Nose normal. No congestion or rhinorrhea.     Mouth/Throat:     Mouth: Mucous membranes are moist.     Pharynx: Oropharynx is clear. No oropharyngeal exudate or posterior oropharyngeal erythema.  Eyes:     General: No scleral icterus.       Right eye: No discharge.        Left eye: No discharge.     Extraocular Movements: Extraocular movements intact.     Conjunctiva/sclera: Conjunctivae normal.  Neck:     Vascular: No carotid bruit.  Cardiovascular:     Rate and Rhythm: Normal rate and regular rhythm.     Pulses: Normal pulses.     Heart sounds: Normal heart sounds. No murmur heard.    No friction rub. No gallop.  Pulmonary:     Effort: Pulmonary effort is normal. No respiratory distress.     Breath sounds: Normal breath sounds. No stridor. No wheezing, rhonchi or rales.  Chest:     Chest wall: No tenderness.  Abdominal:     General: Bowel sounds are normal. There is no distension.     Palpations: Abdomen is soft. There is no mass.     Tenderness: There is no abdominal tenderness. There is no right CVA tenderness, left CVA tenderness, guarding or rebound.     Hernia: No hernia is present.  Musculoskeletal:        General: No swelling, tenderness, deformity or signs of injury.     Cervical back: Normal range of motion and neck supple. No rigidity or tenderness.     Right lower leg: No edema.     Left lower leg: No edema.  Lymphadenopathy:      Cervical: No cervical adenopathy.  Skin:    General: Skin is warm and dry.     Capillary Refill: Capillary refill takes 2 to 3 seconds.     Coloration: Skin is not jaundiced or pale.     Findings: No bruising, erythema, lesion or rash.  Neurological:     Mental Status: She is alert and oriented to person, place, and time.     Cranial Nerves: No cranial nerve deficit.     Sensory: No sensory deficit.     Motor: No weakness.     Coordination: Coordination normal.     Gait: Gait normal.     Deep Tendon Reflexes: Reflexes normal.  Psychiatric:        Mood and Affect: Mood normal.  Behavior: Behavior normal.        Thought Content: Thought content normal.        Judgment: Judgment normal.     No results found for any visits on 11/20/22.     Assessment & Plan:    Routine Health Maintenance and Physical Exam   There is no immunization history on file for this patient.  Health Maintenance  Topic Date Due   COVID-19 Vaccine (1) Never done   HIV Screening  Never done   Hepatitis C Screening  Never done   DTaP/Tdap/Td (1 - Tdap) Never done   Zoster Vaccines- Shingrix (1 of 2) Never done   INFLUENZA VACCINE  02/25/2023   COLONOSCOPY (Pts 45-29yrs Insurance coverage will need to be confirmed)  12/13/2023   MAMMOGRAM  04/08/2024   HPV VACCINES  Aged Out   PAP SMEAR-Modifier  Discontinued    Discussed health benefits of physical activity, and encouraged her to engage in regular exercise appropriate for her age and condition.  Problem List Items Addressed This Visit       Digestive   Gastroesophageal reflux disease    Educational material on GERD given She was encouraged to avoid fatty fried foods spicy foods caffeinated drinks avoid laying down immediately after eating supper Take pantoprazole 40 mg daily as needed      Relevant Medications   pantoprazole (PROTONIX) 40 MG tablet     Genitourinary   Herpes genitalia    Last outbreak was about 6 months  ago. Take medication as needed for herpes outbreak. - acyclovir (ZOVIRAX) 400 MG tablet; Take 1 tablet (400 mg total) by mouth 3 (three) times daily.  Dispense: 30 tablet; Refill: 5       Relevant Medications   acyclovir (ZOVIRAX) 400 MG tablet     Other   SUPRAVENTRICULAR TACHYCARDIA    She is currently on metoprolol 25 mg daily Continue current medication No complaints of palpitations shortness of breath Maintain close follow-up with cardiology      Screening for endocrine, nutritional, metabolic and immunity disorder   Relevant Orders   CBC with Differential/Platelet   CMP14+EGFR   Hemoglobin A1c   Lipid panel   TSH   VITAMIN D 25 Hydroxy (Vit-D Deficiency, Fractures)   HIV Antibody (routine testing w rflx)   Annual physical exam - Primary    Annual exam as documented.  Counseling done include healthy lifestyle involving committing to 150 minutes of exercise per week, heart healthy diet, and attaining healthy weight. The importance of adequate sleep also discussed.  Regular use of seat belt and home safety were also discussed . Changes in health habits are decided on by patient with goals and time frames set for achieving them. Immunization and cancer screening  needs are specifically addressed at this visit.    Fasting labs ordered.  Up-to-date with mammogram and colon cancer screening She was encouraged to consider getting Tdap vaccine and shingles vaccine      Left upper quadrant abdominal pain    Chronic condition Most likely musculoskeletal pain But since patient is worried about her pain we referred to GI for further evaluation to rule out gastrointestinal disorder      Relevant Orders   Ambulatory referral to Gastroenterology   Other Visit Diagnoses     Supraventricular tachycardia   (Chronic)        Return in about 3 months (around 02/19/2023) for abdominal pain.     Robin Beers, FNP

## 2022-11-20 NOTE — Assessment & Plan Note (Signed)
Chronic condition Most likely musculoskeletal pain But since patient is worried about her pain we referred to GI for further evaluation to rule out gastrointestinal disorder

## 2022-11-20 NOTE — Assessment & Plan Note (Addendum)
Annual exam as documented.  Counseling done include healthy lifestyle involving committing to 150 minutes of exercise per week, heart healthy diet, and attaining healthy weight. The importance of adequate sleep also discussed.  Regular use of seat belt and home safety were also discussed . Changes in health habits are decided on by patient with goals and time frames set for achieving them. Immunization and cancer screening  needs are specifically addressed at this visit.    Fasting labs ordered.  Up-to-date with mammogram and colon cancer screening She was encouraged to consider getting Tdap vaccine and shingles vaccine

## 2022-11-20 NOTE — Patient Instructions (Signed)
Please consider getting a Tdap vaccine and shingles vaccine  1. Screening for endocrine, nutritional, metabolic and immunity disorder  - CBC with Differential/Platelet - CMP14+EGFR - Hemoglobin A1c - Lipid panel - TSH - VITAMIN D 25 Hydroxy (Vit-D Deficiency, Fractures) - HIV Antibody (routine testing w rflx)  2. Left upper quadrant abdominal pain  - Ambulatory referral to Gastroenterology  3. Gastroesophageal reflux disease, unspecified whether esophagitis present  - pantoprazole (PROTONIX) 40 MG tablet; Take 1 tablet (40 mg total) by mouth daily as needed.  Dispense: 30 tablet; Refill: 0  4. Annual physical exam    It is important that you exercise regularly at least 30 minutes 5 times a week as tolerated  Think about what you will eat, plan ahead. Choose " clean, green, fresh or frozen" over canned, processed or packaged foods which are more sugary, salty and fatty. 70 to 75% of food eaten should be vegetables and fruit. Three meals at set times with snacks allowed between meals, but they must be fruit or vegetables. Aim to eat over a 12 hour period , example 7 am to 7 pm, and STOP after  your last meal of the day. Drink water,generally about 64 ounces per day, no other drink is as healthy. Fruit juice is best enjoyed in a healthy way, by EATING the fruit.  Thanks for choosing [facility] we consider it a privelige to serve you.

## 2022-11-20 NOTE — Addendum Note (Signed)
Addended by: Donell Beers on: 11/20/2022 06:15 PM   Modules accepted: Orders

## 2022-11-20 NOTE — Assessment & Plan Note (Signed)
Educational material on GERD given She was encouraged to avoid fatty fried foods spicy foods caffeinated drinks avoid laying down immediately after eating supper Take pantoprazole 40 mg daily as needed

## 2022-11-20 NOTE — Assessment & Plan Note (Addendum)
Last outbreak was about 6 months ago. Take medication as needed for herpes outbreak. - acyclovir (ZOVIRAX) 400 MG tablet; Take 1 tablet (400 mg total) by mouth 3 (three) times daily.  Dispense: 30 tablet; Refill: 5

## 2022-11-20 NOTE — Assessment & Plan Note (Signed)
She is currently on metoprolol 25 mg daily Continue current medication No complaints of palpitations shortness of breath Maintain close follow-up with cardiology

## 2022-11-21 LAB — LIPID PANEL
Chol/HDL Ratio: 3.4 ratio (ref 0.0–4.4)
Cholesterol, Total: 178 mg/dL (ref 100–199)
HDL: 52 mg/dL (ref >39)
LDL Chol Calc (NIH): 114 mg/dL — ABNORMAL HIGH (ref 0–99)
Triglycerides: 65 mg/dL (ref 0–149)
VLDL Cholesterol Cal: 12 mg/dL (ref 5–40)

## 2022-11-21 LAB — CMP14+EGFR
ALT: 26 IU/L (ref 0–32)
AST: 23 IU/L (ref 0–40)
Albumin/Globulin Ratio: 1.4 (ref 1.2–2.2)
Albumin: 4.3 g/dL (ref 3.8–4.9)
Alkaline Phosphatase: 101 IU/L (ref 44–121)
BUN/Creatinine Ratio: 13 (ref 9–23)
BUN: 7 mg/dL (ref 6–24)
Bilirubin Total: 0.3 mg/dL (ref 0.0–1.2)
CO2: 21 mmol/L (ref 20–29)
Calcium: 9.3 mg/dL (ref 8.7–10.2)
Chloride: 105 mmol/L (ref 96–106)
Creatinine, Ser: 0.55 mg/dL — ABNORMAL LOW (ref 0.57–1.00)
Globulin, Total: 3.1 g/dL (ref 1.5–4.5)
Glucose: 94 mg/dL (ref 70–99)
Potassium: 4.1 mmol/L (ref 3.5–5.2)
Sodium: 139 mmol/L (ref 134–144)
Total Protein: 7.4 g/dL (ref 6.0–8.5)
eGFR: 107 mL/min/{1.73_m2} (ref >59)

## 2022-11-21 LAB — CBC WITH DIFFERENTIAL/PLATELET
Basophils Absolute: 0.1 10*3/uL (ref 0.0–0.2)
Basos: 1 %
EOS (ABSOLUTE): 0.1 10*3/uL (ref 0.0–0.4)
Eos: 1 %
Hematocrit: 38.5 % (ref 34.0–46.6)
Hemoglobin: 12.6 g/dL (ref 11.1–15.9)
Immature Grans (Abs): 0 10*3/uL (ref 0.0–0.1)
Immature Granulocytes: 0 %
Lymphocytes Absolute: 2.9 10*3/uL (ref 0.7–3.1)
Lymphs: 59 %
MCH: 24.9 pg — ABNORMAL LOW (ref 26.6–33.0)
MCHC: 32.7 g/dL (ref 31.5–35.7)
MCV: 76 fL — ABNORMAL LOW (ref 79–97)
Monocytes Absolute: 0.4 10*3/uL (ref 0.1–0.9)
Monocytes: 9 %
Neutrophils Absolute: 1.5 10*3/uL (ref 1.4–7.0)
Neutrophils: 30 %
Platelets: 311 10*3/uL (ref 150–450)
RBC: 5.07 x10E6/uL (ref 3.77–5.28)
RDW: 16.9 % — ABNORMAL HIGH (ref 11.7–15.4)
WBC: 5 10*3/uL (ref 3.4–10.8)

## 2022-11-21 LAB — HEMOGLOBIN A1C
Est. average glucose Bld gHb Est-mCnc: 128 mg/dL
Hgb A1c MFr Bld: 6.1 % — ABNORMAL HIGH (ref 4.8–5.6)

## 2022-11-21 LAB — VITAMIN D 25 HYDROXY (VIT D DEFICIENCY, FRACTURES): Vit D, 25-Hydroxy: 15 ng/mL — ABNORMAL LOW (ref 30.0–100.0)

## 2022-11-21 LAB — HIV ANTIBODY (ROUTINE TESTING W REFLEX): HIV Screen 4th Generation wRfx: NONREACTIVE

## 2022-11-21 LAB — TSH: TSH: 1.44 u[IU]/mL (ref 0.450–4.500)

## 2022-11-22 ENCOUNTER — Ambulatory Visit (HOSPITAL_COMMUNITY): Payer: BC Managed Care – PPO

## 2022-11-23 ENCOUNTER — Telehealth: Payer: Self-pay

## 2022-11-23 ENCOUNTER — Other Ambulatory Visit: Payer: Self-pay | Admitting: Nurse Practitioner

## 2022-11-23 DIAGNOSIS — E785 Hyperlipidemia, unspecified: Secondary | ICD-10-CM

## 2022-11-23 DIAGNOSIS — E559 Vitamin D deficiency, unspecified: Secondary | ICD-10-CM

## 2022-11-23 DIAGNOSIS — R718 Other abnormality of red blood cells: Secondary | ICD-10-CM

## 2022-11-23 DIAGNOSIS — R7303 Prediabetes: Secondary | ICD-10-CM

## 2022-11-23 MED ORDER — VITAMIN D (ERGOCALCIFEROL) 1.25 MG (50000 UNIT) PO CAPS: 50000.0000 [IU] | ORAL_CAPSULE | ORAL | 0 refills | Status: DC

## 2022-11-23 NOTE — Telephone Encounter (Signed)
Pt would like to discuss on her next apt. Gh

## 2022-11-23 NOTE — Progress Notes (Signed)
Called pt and inform results.Gh 

## 2022-11-24 ENCOUNTER — Encounter: Payer: Self-pay | Admitting: Physician Assistant

## 2022-11-24 NOTE — Progress Notes (Signed)
Iron panel is normal

## 2022-11-26 ENCOUNTER — Telehealth: Payer: Self-pay

## 2022-11-26 NOTE — Telephone Encounter (Signed)
Unable to leave a message.

## 2022-11-27 ENCOUNTER — Other Ambulatory Visit: Payer: Self-pay | Admitting: Nurse Practitioner

## 2022-11-27 ENCOUNTER — Telehealth: Payer: Self-pay | Admitting: Nurse Practitioner

## 2022-11-27 DIAGNOSIS — E559 Vitamin D deficiency, unspecified: Secondary | ICD-10-CM

## 2022-11-27 MED ORDER — VITAMIN D (ERGOCALCIFEROL) 1.25 MG (50000 UNIT) PO CAPS: 50000.0000 [IU] | ORAL_CAPSULE | ORAL | 0 refills | Status: DC

## 2022-11-27 NOTE — Telephone Encounter (Signed)
Pt called asking about her vitamin D - was told to take it for 8 weeks but there are only 4 pills in the bottle

## 2022-11-27 NOTE — Telephone Encounter (Signed)
Called pt and inform message. Gh 

## 2022-11-29 ENCOUNTER — Ambulatory Visit (HOSPITAL_COMMUNITY)
Admission: EM | Admit: 2022-11-29 | Discharge: 2022-11-29 | Disposition: A | Discharge status: Home / Self Care | Payer: BC Managed Care – PPO | Attending: Internal Medicine | Admitting: Internal Medicine

## 2022-11-29 ENCOUNTER — Encounter (HOSPITAL_COMMUNITY): Payer: Self-pay

## 2022-11-29 ENCOUNTER — Ambulatory Visit (INDEPENDENT_AMBULATORY_CARE_PROVIDER_SITE_OTHER): Payer: BC Managed Care – PPO

## 2022-11-29 DIAGNOSIS — K5900 Constipation, unspecified: Secondary | ICD-10-CM | POA: Diagnosis not present

## 2022-11-29 DIAGNOSIS — R109 Unspecified abdominal pain: Secondary | ICD-10-CM

## 2022-11-29 LAB — POCT URINALYSIS DIP (MANUAL ENTRY)
Bilirubin, UA: NEGATIVE
Blood, UA: NEGATIVE
Glucose, UA: NEGATIVE mg/dL
Ketones, POC UA: NEGATIVE mg/dL
Leukocytes, UA: NEGATIVE
Nitrite, UA: NEGATIVE
Protein Ur, POC: NEGATIVE mg/dL
Spec Grav, UA: >=1.03 — AB (ref 1.010–1.025)
Urobilinogen, UA: 0.2 E.U./dL
pH, UA: 5.5 (ref 5.0–8.0)

## 2022-11-29 MED ORDER — POLYETHYLENE GLYCOL 3350 17 GM/SCOOP PO POWD: ORAL | 0 refills | Status: DC

## 2022-11-29 MED ORDER — BACLOFEN 5 MG PO TABS: 5.0000 mg | ORAL_TABLET | Freq: Three times a day (TID) | ORAL | 0 refills | Status: DC | PRN

## 2022-11-29 NOTE — ED Provider Notes (Signed)
[facility]   Note:  This document was prepared using Dragon voice recognition software and may include unintentional dictation errors.  MRN: [id number] DOB: [birth year] DATE: 11/29/22   Subjective:  Chief Complaint:  Chief Complaint  Patient presents with   Flank Pain    HPI: [name] is a 57 y.o. female presenting for left flank pain intermittently for the past several years.  Patient states she was previously evaluated and diagnosed with musculoskeletal issue.  She states the pain comes and go intermittently, but appears to be worse when she is more active.  She recently was babysitting her grandchildren and states she was more active with them. She takes over-the-counter Aleve for the pain and symptoms resolved; however, once the medication wears off the pain returns. She states she did have a colonoscopy in the past and it was unremarkable.  Certain movements such as rotation and bending make the symptoms worse.  Her last bowel movement was yesterday and within normal limits for her. She states she does deal with occasional constipation and has been taking over-the-counter cleansing pills for her symptoms. Denies fever, nausea/vomiting, abdominal pain, dysuria, diarrhea. Endorses left flank pain. Presents NAD.  Prior to Admission medications   Medication Sig Start Date End Date Taking? Authorizing Provider  acyclovir (ZOVIRAX) 400 MG tablet Take 1 tablet (400 mg total) by mouth 3 (three) times daily. 11/20/22  Yes Paseda, Baird Kay, FNP  metoprolol succinate (TOPROL XL) 25 MG 24 hr tablet Take 1 tablet (25 mg total) by mouth in the morning and at bedtime. 05/15/22  Yes Marinus Maw, MD  pantoprazole (PROTONIX) 40 MG tablet Take 1 tablet (40 mg total) by mouth daily as needed. 11/20/22  Yes Paseda, Baird Kay, FNP  Vitamin D, Ergocalciferol, (DRISDOL) 1.25 MG (50000 UNIT) CAPS capsule Take 1 capsule (50,000 Units total) by mouth every 7 (seven) days. 12/25/22  Yes  Donell Beers, FNP     No Known Allergies  History:   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   Chest pain    CHEST PAIN 11/13/2008   Qualifier: Diagnosis of  By: Flonnie Overman     Chronic anemia    Difficulty sleeping    Dysmenorrhea 2011   Dysrhythmia    irregular heart beats   Fibroids 09/20/2013   Heart palpitations    History of abdominal hysterectomy 03/2014   History of uterine fibroid    Menorrhagia    Palpitations 09/23/2006   Qualifier: Diagnosis of  By: Levada Schilling     Shortness of breath    "associated w/high heart rate" (01/04/2013)   Supraventricular tachycardia    SUPRAVENTRICULAR TACHYCARDIA 11/13/2008   Qualifier: Diagnosis of  By: Flonnie Overman     SVT (supraventricular tachycardia)      Past Surgical History:  Procedure Laterality Date   ABDOMINAL HYSTERECTOMY     AUGMENTATION MAMMAPLASTY Bilateral    CARDIAC ELECTROPHYSIOLOGY MAPPING AND ABLATION  ~ 2008; 01/04/2013   "weren't able to do the ablation" (01/04/2013)   COLONOSCOPY  12/12/2013   COMBINED AUGMENTATION MAMMAPLASTY AND ABDOMINOPLASTY Bilateral 2002   ELECTROPHYSIOLOGY STUDY N/A 01/04/2013   Procedure: ELECTROPHYSIOLOGY STUDY;  Surgeon: Marinus Maw, MD;  Location: [facility] CATH LAB;  Service: Cardiovascular;  Laterality: N/A;   SUPRAVENTRICULAR TACHYCARDIA ABLATION N/A 01/04/2013   Procedure: SUPRAVENTRICULAR TACHYCARDIA ABLATION;  Surgeon: Sharlot Gowda  Myna Hidalgo, MD;  Location: [facility] CATH LAB;  Service: Cardiovascular;  Laterality: N/A;   TUBAL LIGATION     UTERINE FIBROID SURGERY  ?09/2012   VAGINAL HYSTERECTOMY Bilateral 03/27/2014   Procedure: HYSTERECTOMY VAGINAL WITH BILATERAL SALPINGECTOMY;  Surgeon: Willodean Rosenthal, MD;  Location: [facility] ORS;  Service: Gynecology;  Laterality: Bilateral;    Family History  Problem Relation Age of Onset   Cancer Brother    Cancer  Paternal Aunt        x 2 unkown type   Breast cancer Paternal Aunt    Heart disease Maternal Grandmother    Heart Problems Mother        apid heartbeat   Breast cancer Sister 29   Breast cancer Paternal Grandmother    Colon cancer Neg Hx     Social History   Tobacco Use   Smoking status: Former    Packs/day: 0.33    Years: 3.00    Additional pack years: 0.00    Total pack years: 0.99    Types: Cigarettes    Quit date: 12/27/1984    Years since quitting: 37.9   Smokeless tobacco: Never  Vaping Use   Vaping Use: Never used  Substance Use Topics   Alcohol use: Not Currently   Drug use: Not Currently    Types: Marijuana    Comment: last day used 2 weeks     Review of Systems  Constitutional:  Positive for activity change. Negative for fever.  Gastrointestinal:  Negative for abdominal pain, constipation, diarrhea, nausea and vomiting.  Genitourinary:  Positive for flank pain. Negative for dysuria and frequency.  Musculoskeletal:  Negative for back pain.     Objective:   Vitals: BP 131/71 (BP Location: Right Arm)   Pulse 65   Temp 97.6 F (36.4 C) (Oral)   Resp 16   Ht 4\' 11"  (1.499 m)   Wt 155 lb (70.3 kg)   LMP 03/15/2014 (Exact Date)   SpO2 94%   BMI 31.31 kg/m   Physical Exam Constitutional:      General: She is not in acute distress.    Appearance: Normal appearance. She is well-developed. She is obese. She is not ill-appearing or toxic-appearing.  HENT:     Head: Normocephalic and atraumatic.  Cardiovascular:     Rate and Rhythm: Normal rate and regular rhythm.     Heart sounds: Normal heart sounds.  Pulmonary:     Effort: Pulmonary effort is normal.     Breath sounds: Normal breath sounds.     Comments: Clear to auscultation bilaterally  Abdominal:     General: Bowel sounds are normal.     Palpations: Abdomen is soft.     Tenderness: There is no abdominal tenderness. There is no right CVA tenderness or left CVA tenderness.  Musculoskeletal:      Lumbar back: Normal.  Skin:    General: Skin is warm and dry.  Neurological:     General: No focal deficit present.     Mental Status: She is alert.  Psychiatric:        Mood and Affect: Mood and affect normal.     Results:  Labs: Results for orders placed or performed during the hospital encounter of 11/29/22 (from the past 24 hour(s))  POC urinalysis dipstick     Status: Abnormal   Collection Time: 11/29/22  3:09 PM  Result Value Ref Range   Color, UA yellow yellow   Clarity, UA clear clear   Glucose, UA  negative negative mg/dL   Bilirubin, UA negative negative   Ketones, POC UA negative negative mg/dL   Spec Grav, UA >=0.981 (A) 1.010 - 1.025   Blood, UA negative negative   pH, UA 5.5 5.0 - 8.0   Protein Ur, POC negative negative mg/dL   Urobilinogen, UA 0.2 0.2 or 1.0 E.U./dL   Nitrite, UA Negative Negative   Leukocytes, UA Negative Negative    Radiology: DG Abd 1 View  Result Date: 11/29/2022 CLINICAL DATA:  Left flank pain for 2 years EXAM: ABDOMEN - 1 VIEW COMPARISON:  12/12/2020 FINDINGS: 2 supine frontal views of the abdomen and pelvis exclude the right hemidiaphragm and left flank by collimation. Unremarkable bowel gas pattern without obstruction or ileus. No masses or abnormal calcifications. No acute bony abnormalities. IMPRESSION: 1. Unremarkable bowel gas pattern. 2. No evidence of radiopaque urinary tract calculi. Electronically Signed   By: Sharlet Salina M.D.   On: 11/29/2022 15:08     UC Course/Treatments:  Procedures: Procedures   Medications Ordered in UC: Medications - No data to display   Assessment and Plan :     ICD-10-CM   1. Left flank pain  R10.9     2. Constipation, unspecified constipation type  K59.00      Left flank pain: Afebrile, nontoxic-appearing, NAD. VSS. DDX includes but not limited to: Pyelonephritis, nephrolithiasis, strain UA was unremarkable today in office. Imaging was negative. Baclofen 5mg  TID PRN was prescribed to  treat suspected strain. Strict ED precautions were given and patient verbalized understanding.  Constipation: Afebrile, nontoxic-appearing, NAD. VSS. DDX includes but not limited to: Constipation, obstruction, malignancy UA was unremarkable today in office. Imaging was negative.  Last bowel movement was yesterday and within normal limits.  Patient reports occasional constipation.  Suspect this can be contributing to left flank pain.  MiraLAX 1 capful daily as needed was prescribed to help with occasional constipation. Strict ED precautions were given and patient verbalized understanding.  ED Discharge Orders          Ordered    Baclofen 5 MG TABS  3 times daily PRN        11/29/22 1520    polyethylene glycol powder (GLYCOLAX/MIRALAX) 17 GM/SCOOP powder        11/29/22 1527             PDMP not reviewed this encounter.     Cynda Acres, PA-C 11/29/22 1914

## 2022-11-29 NOTE — Discharge Instructions (Signed)
Your x-rays showed no broken bones or dislocations. They were sent to a radiologist for further evaluation and we will call you if the radiologist sees anything different.   You were given a prescription for a muscle relaxer (Baclofen) to take at bedtime when you are not driving or working because it may cause dizziness/drowsiness.  Please noted our office is limited in the tests and imaging we can perform at our facility. Your evaluation was not suggestive of any emergent condition requiring medical intervention at this time. However, some abdominal problems make take more time to appear. Therefore, it is very important for you to pay attention to any new symptoms or worsening of your current condition.   Please go directly to the Emergency Department immediately should you begin to feel worse in any way or have any of the following symptoms: increasing or different abdominal pain, persistent vomiting, inability to drink fluids, fevers, bloody bowel movements, or begin vomiting blood.

## 2022-11-29 NOTE — ED Triage Notes (Signed)
Patient here today with left flank pain X 2 years. Patient has been seen before for this and has been told that it was muscular but she thinks it's more than that. She is concerned with a possible kidney stone. She stated that she had blood in her stool about a year ago. She takes Aleve which helps but only temporarily. She does have constipation frequently and takes cleansing pills that she gets from [facility].

## 2022-12-01 ENCOUNTER — Telehealth: Payer: Self-pay

## 2022-12-01 NOTE — Telephone Encounter (Signed)
Called pt and advise. Gh 

## 2022-12-15 ENCOUNTER — Other Ambulatory Visit: Payer: Self-pay | Admitting: Nurse Practitioner

## 2022-12-15 DIAGNOSIS — K219 Gastro-esophageal reflux disease without esophagitis: Secondary | ICD-10-CM

## 2022-12-15 LAB — IRON,TIBC AND FERRITIN PANEL
Ferritin: 35 ng/mL (ref 15–150)
Iron Saturation: 26 % (ref 15–55)
Iron: 81 ug/dL (ref 27–159)
Total Iron Binding Capacity: 311 ug/dL (ref 250–450)
UIBC: 230 ug/dL (ref 131–425)

## 2022-12-15 LAB — SPECIMEN STATUS REPORT

## 2023-01-27 ENCOUNTER — Ambulatory Visit (INDEPENDENT_AMBULATORY_CARE_PROVIDER_SITE_OTHER): Payer: BC Managed Care – PPO | Admitting: Physician Assistant

## 2023-01-27 ENCOUNTER — Encounter: Payer: Self-pay | Admitting: Physician Assistant

## 2023-01-27 VITALS — BP 112/68 | Ht 59.0 in | Wt 160.8 lb

## 2023-01-27 DIAGNOSIS — R109 Unspecified abdominal pain: Secondary | ICD-10-CM | POA: Diagnosis not present

## 2023-01-27 DIAGNOSIS — K5900 Constipation, unspecified: Secondary | ICD-10-CM

## 2023-01-27 NOTE — Patient Instructions (Addendum)
Follow up as needed   If your blood pressure at your visit was 140/90 or greater, please contact your primary care physician to follow up on this.    If you are age 58 or older, your body mass index should be between 23-30. Your Body mass index is 32.48 kg/m. If this is out of the aforementioned range listed, please consider follow up with your Primary Care Provider.  If you are age 54 or younger, your body mass index should be between 19-25. Your Body mass index is 32.48 kg/m. If this is out of the aformentioned range listed, please consider follow up with your Primary Care Provider.    The [facility] providers would like to encourage you to use [facility] to communicate with providers for non-urgent requests or questions.  Due to long hold times on the telephone, sending your provider a message by [facility] may be a faster and more efficient way to get a response.  Please allow 48 business hours for a response.  Please remember that this is for non-urgent requests.    Thank you for entrusting me with your care and choosing [facility].  Hyacinth Meeker PA-C

## 2023-01-27 NOTE — Progress Notes (Signed)
Chief Complaint: Abdominal Pain  HPI:    [name] is a  57 y/o female with a past medical history as listed below, known to Dr. Leone Payor, who was referred to me by Donell Beers, FNP for a complaint of abdominal pain.      12/12/2013 colonoscopy with moderate sized internal and external hemorrhoids and otherwise normal.  Repeat recommended in 10 years.    12/12/2020 CT of the abdomen pelvis without contrast with no acute findings in the abdomen or pelvis.    11/20/2022 labs iron studies normal, vitamin D low at 15, TSH normal, hemoglobin A1c 6.1, CMP and CBC normal.    11/29/2022 abdominal x-ray with unremarkable bowel gas pattern.    11/29/2022 ER visit for flank pain and at that time described intermittent flank pain for several years diagnosed musculoskeletal issue but the pain would come and go intermittently.  She took Aleve for the symptoms.  At that time thought constipation was contributing.  She was given Baclofen and MiraLAX.    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  She went to the ER as above and used her Baclofen for a few days and continues on MiraLAX and has had a decrease in the pain.  Tells me it is maybe a 1-10/10 now or is not there at all.  Does describe being constipated previously with only 2 bowel movements a week which are hard to get out, now taking MiraLAX daily as well as drinking mushroom coffee in the morning and is going one time a day and feeling more empty.  Denies any other acute complaints or concerns.    Denies fever, chills, weight loss or blood in her stool.  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   Chest pain     CHEST PAIN 11/13/2008   Qualifier: Diagnosis of  By: Flonnie Overman     Chronic anemia    Difficulty sleeping    Dysmenorrhea 2011   Dysrhythmia    irregular heart beats   Fibroids 09/20/2013   Heart palpitations    History of abdominal hysterectomy 03/2014   History of uterine fibroid    Menorrhagia    Palpitations 09/23/2006   Qualifier: Diagnosis of  By: Levada Schilling     Shortness of breath    "associated w/high heart rate" (01/04/2013)   SUPRAVENTRICULAR TACHYCARDIA 11/13/2008   Qualifier: Diagnosis of  By: Flonnie Overman     SVT (supraventricular tachycardia)     Past Surgical History:  Procedure Laterality Date   ABDOMINAL HYSTERECTOMY     AUGMENTATION MAMMAPLASTY Bilateral    CARDIAC ELECTROPHYSIOLOGY MAPPING AND ABLATION  ~ 2008; 01/04/2013   "weren't able to do the ablation" (01/04/2013)   COLONOSCOPY  12/12/2013   COMBINED AUGMENTATION MAMMAPLASTY AND ABDOMINOPLASTY Bilateral 2002   ELECTROPHYSIOLOGY STUDY N/A 01/04/2013   Procedure: ELECTROPHYSIOLOGY STUDY;  Surgeon: Marinus Maw, MD;  Location: [facility] C[facility] LAB;  Service: Cardiovascular;  Laterality: N/A;   SUPRAVENTRICULAR TACHYCARDIA ABLATION N/A 01/04/2013   Procedure: SUPRAVENTRICULAR TACHYCARDIA ABLATION;  Surgeon: Marinus Maw, MD;  Location: [facility] CATH LAB;  Service: Cardiovascular;  Laterality: N/A;   TUBAL LIGATION     UTERINE FIBROID SURGERY  ?09/2012   VAGINAL HYSTERECTOMY Bilateral 03/27/2014   Procedure: HYSTERECTOMY VAGINAL WITH BILATERAL SALPINGECTOMY;  Surgeon: Willodean Rosenthal, MD;  Location: [facility] ORS;  Service: Gynecology;  Laterality: Bilateral;    Current Outpatient Medications  Medication Sig Dispense Refill   cyanocobalamin (VITAMIN B12) 500 MCG tablet Take 500 mcg by mouth daily.     metoprolol succinate (TOPROL XL) 25 MG 24 hr tablet Take 1 tablet (25 mg total) by mouth in the morning and at bedtime. 180 tablet 3   pantoprazole (PROTONIX) 40 MG tablet TAKE 1 TABLET BY MOUTH DAILY AS NEEDED 90  tablet 1   polyethylene glycol powder (GLYCOLAX/MIRALAX) 17 GM/SCOOP powder 1 capful or pkt PO qd PRN; MAX 1 capful or pkt/day; dissolve in 4-8oz of liquid (Patient taking differently: Take 17 g by mouth as needed. 1 capful or pkt PO qd PRN; MAX 1 capful or pkt/day; dissolve in 4-8oz of liquid) 255 g 0   acyclovir (ZOVIRAX) 400 MG tablet Take 1 tablet (400 mg total) by mouth 3 (three) times daily. 30 tablet 5   Baclofen 5 MG TABS Take 1 tablet (5 mg total) by mouth 3 (three) times daily as needed. (Patient not taking: Reported on 01/27/2023) 15 tablet 0   No current facility-administered medications for this visit.    Allergies as of 01/27/2023   (No Known Allergies)    Family History  Problem Relation Age of Onset   Cancer Brother    Cancer Paternal Aunt        x 2 unkown type   Breast cancer Paternal Aunt    Heart disease Maternal Grandmother    Heart Problems Mother        apid heartbeat   Breast cancer Sister 48   Breast cancer Paternal Grandmother    Colon cancer Neg Hx     Social History   Socioeconomic History   Marital status: Legally Separated    Spouse name: Not on file   Number of children: 4   Years of education: Not on file   Highest education level: Not on file  Occupational History   Occupation: order Education officer, community: [employer]  Tobacco Use   Smoking status: Former    Packs/day: 0.33    Years: 3.00    Additional pack years: 0.00    Total pack years: 0.99    Types: Cigarettes    Quit date: 12/27/1984    Years since quitting: 38.1   Smokeless tobacco: Never  Vaping Use   Vaping Use: Never used  Substance and Sexual Activity   Alcohol use: Yes    Comment: occ   Drug use: Not Currently    Types: Marijuana    Comment: every other week   Sexual activity: Yes    Birth control/protection: None  Other Topics Concern   Not on file  Social History Narrative   Lives home alone   Social Determinants of Health   Financial Resource Strain: Not on  file  Food Insecurity: Not on file  Transportation Needs: Not on file  Physical Activity: Not on file  Stress: Not on file  Social Connections: Not on file  Intimate Partner Violence: Not on file    Review of Systems:    Constitutional: No weight loss, fever or chills Skin: No rash Cardiovascular: No chest pain Respiratory: No SOB  Gastrointestinal: See HPI and otherwise negative Genitourinary: No dysuria  Neurological:  No headache, dizziness or syncope Musculoskeletal: No new muscle or joint pain Hematologic: No bleeding  Psychiatric: No history of depression or anxiety   Physical Exam:  Vital signs: BP 112/68   Ht 4\' 11"  (1.499 m)   Wt 160 lb 12.8 oz (72.9 kg)   LMP 03/15/2014 (Exact Date)   BMI 32.48 kg/m    Constitutional:   Pleasant overweight AA female appears to be in NAD, Well developed, Well nourished, alert and cooperative Head:  Normocephalic and atraumatic. Eyes:   PEERL, EOMI. No icterus. Conjunctiva pink. Ears:  Normal auditory acuity. Neck:  Supple Throat: Oral cavity and pharynx without inflammation, swelling or lesion.  Respiratory: Respirations even and unlabored. Lungs clear to auscultation bilaterally.   No wheezes, crackles, or rhonchi.  Cardiovascular: Normal S1, S2. No MRG. Regular rate and rhythm. No peripheral edema, cyanosis or pallor.  Gastrointestinal:  Soft, nondistended, nontender. No rebound or guarding. Normal bowel sounds. No appreciable masses or hepatomegaly. Rectal:  Not performed.  Msk:  Symmetrical without gross deformities. Without edema, no deformity or joint abnormality.  Neurologic:  Alert and  oriented x4;  grossly normal neurologically.  Skin:   Dry and intact without significant lesions or rashes. Psychiatric:  Demonstrates good judgement and reason without abnormal affect or behaviors.  RELEVANT LABS AND IMAGING: CBC    Component Value Date/Time   WBC 5.0 11/20/2022 0931   WBC 7.5 12/12/2020 0829   RBC 5.07 11/20/2022  0931   RBC 5.44 (H) 12/12/2020 0829   HGB 12.6 11/20/2022 0931   HCT 38.5 11/20/2022 0931   PLT 311 11/20/2022 0931   MCV 76 (L) 11/20/2022 0931   MCH 24.9 (L) 11/20/2022 0931   MCH 24.4 (L) 12/12/2020 0829   MCHC 32.7 11/20/2022 0931   MCHC 33.7 12/12/2020 0829   RDW 16.9 (H) 11/20/2022 0931   LYMPHSABS 2.9 11/20/2022 0931   MONOABS 0.4 08/02/2017 0922   EOSABS 0.1 11/20/2022 0931   BASOSABS 0.1 11/20/2022 0931    CMP     Component Value Date/Time   NA 139 11/20/2022 0931   K 4.1 11/20/2022 0931   CL 105 11/20/2022 0931   CO2 21 11/20/2022 0931   GLUCOSE 94 11/20/2022 0931   GLUCOSE 123 (H) 12/12/2020 0829   BUN 7 11/20/2022 0931   CREATININE 0.55 (L) 11/20/2022 0931   CALCIUM 9.3 11/20/2022 0931   PROT 7.4 11/20/2022 0931   ALBUMIN 4.3 11/20/2022 0931   AST 23 11/20/2022 0931   ALT 26 11/20/2022 0931   ALKPHOS 101 11/20/2022 0931   BILITOT 0.3 11/20/2022 0931   GFRNONAA >60 12/12/2020 0829   GFRAA >60 08/02/2017 0922    Assessment: 1.  Left flank pain: Better after a round of Baclofen and having more normal bowel movements with MiraLAX; consider musculoskeletal etiology versus constipation 2.  Constipation: Chronic for the patient over the past 2 to 3 years, better now on daily MiraLAX; likely slow transit/diet 3.  Screening for colorectal cancer: Next colonoscopy due in 2025, recall is in the computer  Plan: 1.  Discussed with patient that her flank pain could have been musculoskeletal in origin and the Baclofen relieved it or it could have been related to constipation and now she is having normal bowel movements it is better. 2.  Recommend she continue MiraLAX once daily.  Discussed titration of this. 3.  Discussed patient's next colonoscopy is due in a year for screening of colon cancer 4.  Patient will call if she has any  problems before then.  Hyacinth Meeker, PA-C [facility] 01/27/2023, 8:36 AM  Cc: Donell Beers, FNP

## 2023-02-19 ENCOUNTER — Encounter: Payer: Self-pay | Admitting: Nurse Practitioner

## 2023-02-19 ENCOUNTER — Ambulatory Visit (INDEPENDENT_AMBULATORY_CARE_PROVIDER_SITE_OTHER): Payer: BC Managed Care – PPO | Admitting: Nurse Practitioner

## 2023-02-19 VITALS — BP 114/63 | HR 62 | Temp 97.5°F | Ht 59.0 in | Wt 161.8 lb

## 2023-02-19 DIAGNOSIS — R1012 Left upper quadrant pain: Secondary | ICD-10-CM

## 2023-02-19 DIAGNOSIS — Z1159 Encounter for screening for other viral diseases: Secondary | ICD-10-CM

## 2023-02-19 DIAGNOSIS — E559 Vitamin D deficiency, unspecified: Secondary | ICD-10-CM | POA: Diagnosis not present

## 2023-02-19 DIAGNOSIS — R5383 Other fatigue: Secondary | ICD-10-CM | POA: Diagnosis not present

## 2023-02-19 DIAGNOSIS — K5900 Constipation, unspecified: Secondary | ICD-10-CM

## 2023-02-19 DIAGNOSIS — R109 Unspecified abdominal pain: Secondary | ICD-10-CM

## 2023-02-19 NOTE — Assessment & Plan Note (Signed)
Encouraged to increase intake of fiber and fluids Take OTC stool softener as needed

## 2023-02-19 NOTE — Progress Notes (Signed)
Established Patient Office Visit  Subjective:  Patient ID: [name], [name]    DOB: [birth year]  Age: 57 y.o. MRN: [id number]  CC:  Chief Complaint  Patient presents with   Follow-up    Abdominal left side pain.    HPI [name] is a 57 y.o. [name]  has a past medical history of ALCOHOL USE (11/13/2008), ASCUS with positive high risk HPV (02/07/2012), Chest pain, CHEST PAIN (11/13/2008), Chronic anemia, Difficulty sleeping, Dysmenorrhea (2011), Dysrhythmia, Fibroids (09/20/2013), Heart palpitations, History of abdominal hysterectomy (03/2014), History of uterine fibroid, Menorrhagia, Palpitations (09/23/2006), Shortness of breath, SUPRAVENTRICULAR TACHYCARDIA (11/13/2008), and SVT (supraventricular tachycardia).   Patient presents for follow-up for abdominal pain.  She was recently evaluated by GI, her pain was thought to be due to musculoskeletal pain or could be related to constipation,she was told to take MiraLAX as needed.  She stated that her pain had resolved when she was taking MiraLAX but when she stopped taking MiraLAX her pain returned.  She stopped taking MiraLAX because she was having to go to the bathroom frequently.  She is currently not moving her bowel regularly and has noticed blood in her stool twice recently.Aspercreme also helps her pain sometimes.  States that she does a lot of working out.  No complaints of fever, chills, cough, wheezing, chest pain, shortness of breath nausea, vomiting.    Vitamin D deficiency .stated that she has been feeling tired a lot for weeks. Now takes OTC vitamin D.  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   Chest pain    CHEST PAIN 11/13/2008   Qualifier: Diagnosis of  By: Flonnie Overman     Chronic anemia    Difficulty sleeping    Dysmenorrhea 2011    Dysrhythmia    irregular heart beats   Fibroids 09/20/2013   Heart palpitations    History of abdominal hysterectomy 03/2014   History of uterine fibroid    Menorrhagia    Palpitations 09/23/2006   Qualifier: Diagnosis of  By: Levada Schilling     Shortness of breath    "associated w/high heart rate" (01/04/2013)   SUPRAVENTRICULAR TACHYCARDIA 11/13/2008   Qualifier: Diagnosis of  By: Flonnie Overman     SVT (supraventricular tachycardia)     Past Surgical History:  Procedure Laterality Date   ABDOMINAL HYSTERECTOMY     AUGMENTATION MAMMAPLASTY Bilateral    CARDIAC ELECTROPHYSIOLOGY MAPPING AND ABLATION  ~ 2008; 01/04/2013   "weren't able to do the ablation" (01/04/2013)   COLONOSCOPY  12/12/2013   COMBINED AUGMENTATION MAMMAPLASTY AND ABDOMINOPLASTY Bilateral 2002   ELECTROPHYSIOLOGY STUDY N/A 01/04/2013   Procedure: ELECTROPHYSIOLOGY STUDY;  Surgeon: Marinus Maw, MD;  Location[facility] C[facility] LAB;  Service: Cardiovascular;  Laterality: N/A;   SUPRAVENTRICULAR TACHYCARDIA ABLATION N/A 01/04/2013   Procedure: SUPRAVENTRICULAR TACHYCARDIA ABLATION;  Surgeon: Marinus Maw, MD;  Location: [facility] CATH LAB;  Service: Cardiovascular;  Laterality: N/A;   TUBAL LIGATION     UTERINE FIBROID SURGERY  ?09/2012   VAGINAL HYSTERECTOMY Bilateral 03/27/2014   Procedure: HYSTERECTOMY VAGINAL WITH BILATERAL SALPINGECTOMY;  Surgeon: Willodean Rosenthal, MD;  Location: [facility] ORS;  Service: Gynecology;  Laterality: Bilateral;    Family History  Problem Relation Age of Onset   Cancer Brother    Cancer Paternal Aunt  x 2 unkown type   Breast cancer Paternal Aunt    Heart disease Maternal Grandmother    Heart Problems Mother        apid heartbeat   Breast cancer Sister 87   Breast cancer Paternal Grandmother    Colon cancer Neg Hx     Social History   Socioeconomic History   Marital status: Legally Separated    Spouse name: Not on file   Number of children: 4   Years of education: Not on file    Highest education level: Not on file  Occupational History   Occupation: order Education officer, community: [employer]  Tobacco Use   Smoking status: Former    Current packs/day: 0.00    Average packs/day: 0.3 packs/day for 3.0 years (1.0 ttl pk-yrs)    Types: Cigarettes    Start date: 12/27/1981    Quit date: 12/27/1984    Years since quitting: 38.1   Smokeless tobacco: Never  Vaping Use   Vaping status: Never Used  Substance and Sexual Activity   Alcohol use: Yes    Comment: occ   Drug use: Not Currently    Types: Marijuana    Comment: every other week   Sexual activity: Yes    Birth control/protection: None  Other Topics Concern   Not on file  Social History Narrative   Lives home alone   Social Determinants of Health   Financial Resource Strain: Not on file  Food Insecurity: Not on file  Transportation Needs: Not on file  Physical Activity: Not on file  Stress: Not on file  Social Connections: Not on file  Intimate Partner Violence: Not on file    Outpatient Medications Prior to Visit  Medication Sig Dispense Refill   acyclovir (ZOVIRAX) 400 MG tablet Take 1 tablet (400 mg total) by mouth 3 (three) times daily. 30 tablet 5   cyanocobalamin (VITAMIN B12) 500 MCG tablet Take 500 mcg by mouth daily.     metoprolol succinate (TOPROL XL) 25 MG 24 hr tablet Take 1 tablet (25 mg total) by mouth in the morning and at bedtime. 180 tablet 3   pantoprazole (PROTONIX) 40 MG tablet TAKE 1 TABLET BY MOUTH DAILY AS NEEDED 90 tablet 1   polyethylene glycol powder (GLYCOLAX/MIRALAX) 17 GM/SCOOP powder 1 capful or pkt PO qd PRN; MAX 1 capful or pkt/day; dissolve in 4-8oz of liquid (Patient taking differently: Take 17 g by mouth as needed. 1 capful or pkt PO qd PRN; MAX 1 capful or pkt/day; dissolve in 4-8oz of liquid) 255 g 0   Baclofen 5 MG TABS Take 1 tablet (5 mg total) by mouth 3 (three) times daily as needed. (Patient not taking: Reported on 01/27/2023) 15 tablet 0   No  facility-administered medications prior to visit.    No Known Allergies  ROS Review of Systems  Constitutional:  Negative for activity change, appetite change, chills, fatigue and fever.  HENT:  Negative for congestion, dental problem, ear discharge, ear pain, hearing loss, rhinorrhea, sinus pressure, sinus pain, sneezing and sore throat.   Eyes:  Negative for pain, discharge, redness and itching.  Respiratory:  Negative for cough, chest tightness, shortness of breath and wheezing.   Cardiovascular:  Negative for chest pain, palpitations and leg swelling.  Gastrointestinal:  Positive for abdominal pain. Negative for abdominal distention, anal bleeding, blood in stool and constipation.  Endocrine: Negative for cold intolerance, heat intolerance, polydipsia, polyphagia and polyuria.  Genitourinary:  Positive for flank pain.  Negative for difficulty urinating, dysuria, frequency, hematuria, menstrual problem, pelvic pain and vaginal bleeding.  Musculoskeletal:  Negative for arthralgias, back pain, gait problem, joint swelling and myalgias.  Skin:  Negative for color change, pallor, rash and wound.  Allergic/Immunologic: Negative for environmental allergies, food allergies and immunocompromised state.  Neurological:  Negative for dizziness, tremors, facial asymmetry, weakness and headaches.  Hematological:  Negative for adenopathy. Does not bruise/bleed easily.  Psychiatric/Behavioral:  Negative for agitation, behavioral problems, confusion, decreased concentration, hallucinations, self-injury and suicidal ideas.       Objective:    Physical Exam Vitals and nursing note reviewed.  Constitutional:      General: She is not in acute distress.    Appearance: Normal appearance. She is obese. She is not ill-appearing, toxic-appearing or diaphoretic.  HENT:     Mouth/Throat:     Mouth: Mucous membranes are moist.     Pharynx: Oropharynx is clear. No oropharyngeal exudate or posterior  oropharyngeal erythema.  Eyes:     General: No scleral icterus.       Right eye: No discharge.        Left eye: No discharge.     Extraocular Movements: Extraocular movements intact.     Conjunctiva/sclera: Conjunctivae normal.  Cardiovascular:     Rate and Rhythm: Normal rate and regular rhythm.     Pulses: Normal pulses.     Heart sounds: Normal heart sounds. No murmur heard.    No friction rub. No gallop.  Pulmonary:     Effort: Pulmonary effort is normal. No respiratory distress.     Breath sounds: Normal breath sounds. No stridor. No wheezing, rhonchi or rales.  Chest:     Chest wall: No tenderness.  Abdominal:     General: There is no distension.     Palpations: Abdomen is soft.     Tenderness: There is no abdominal tenderness. There is no right CVA tenderness, left CVA tenderness or guarding.  Musculoskeletal:        General: No swelling, tenderness, deformity or signs of injury.     Right lower leg: No edema.     Left lower leg: No edema.  Skin:    General: Skin is warm and dry.     Capillary Refill: Capillary refill takes less than 2 seconds.     Coloration: Skin is not jaundiced or pale.     Findings: No bruising, erythema or lesion.  Neurological:     Mental Status: She is alert and oriented to person, place, and time.     Motor: No weakness.     Coordination: Coordination normal.     Gait: Gait normal.  Psychiatric:        Mood and Affect: Mood normal.        Behavior: Behavior normal.        Thought Content: Thought content normal.        Judgment: Judgment normal.     BP 114/63   Pulse 62   Temp (!) 97.5 F (36.4 C)   Ht 4\' 11"  (1.499 m)   Wt 161 lb 12.8 oz (73.4 kg)   LMP 03/15/2014 (Exact Date)   SpO2 97%   BMI 32.68 kg/m  Wt Readings from Last 3 Encounters:  02/19/23 161 lb 12.8 oz (73.4 kg)  01/27/23 160 lb 12.8 oz (72.9 kg)  11/29/22 155 lb (70.3 kg)    Lab Results  Component Value Date   TSH 1.440 11/20/2022   Lab Results  Component  Value Date   WBC 5.0 11/20/2022   HGB 12.6 11/20/2022   HCT 38.5 11/20/2022   MCV 76 (L) 11/20/2022   PLT 311 11/20/2022   Lab Results  Component Value Date   NA 139 11/20/2022   K 4.1 11/20/2022   CO2 21 11/20/2022   GLUCOSE 94 11/20/2022   BUN 7 11/20/2022   CREATININE 0.55 (L) 11/20/2022   BILITOT 0.3 11/20/2022   ALKPHOS 101 11/20/2022   AST 23 11/20/2022   ALT 26 11/20/2022   PROT 7.4 11/20/2022   ALBUMIN 4.3 11/20/2022   CALCIUM 9.3 11/20/2022   ANIONGAP 7 12/12/2020   EGFR 107 11/20/2022   GFR 134.73 12/22/2012   Lab Results  Component Value Date   CHOL 178 11/20/2022   Lab Results  Component Value Date   HDL 52 11/20/2022   Lab Results  Component Value Date   LDLCALC 114 (H) 11/20/2022   Lab Results  Component Value Date   TRIG 65 11/20/2022   Lab Results  Component Value Date   CHOLHDL 3.4 11/20/2022   Lab Results  Component Value Date   HGBA1C 6.1 (H) 11/20/2022      Assessment & Plan:   Problem List Items Addressed This Visit       Other   Left upper quadrant abdominal pain - Primary    Her pain is most likely of musculoskeletal origin Continue Aspercreme as needed Also encouraged patient to take OTC stool softener as needed for constipation as constipation could be contributing to her pain.  May follow-up with GI as needed      Fatigue   Relevant Orders   Vitamin B12   VITAMIN D 25 Hydroxy (Vit-D Deficiency, Fractures)   Vitamin D deficiency    Checking vitamin D levels and B12 Last vitamin D Lab Results  Component Value Date   VD25OH 15.0 (L) 11/20/2022         Relevant Orders   Vitamin B12   VITAMIN D 25 Hydroxy (Vit-D Deficiency, Fractures)   Flank pain    No pain on examination today Is the patient that her pain could be of musculoskeletal origin Continue Aspercreme as needed stated that Aspercreme  helps her pain.       Constipation    Encouraged to increase intake of fiber and fluids Take OTC stool softener as  needed      Other Visit Diagnoses     Need for hepatitis C screening test       Relevant Orders   Hepatitis C antibody       No orders of the defined types were placed in this encounter.   Follow-up: Return in about 4 months (around 06/22/2023) for abdominal pain.    Donell Beers, FNP

## 2023-02-19 NOTE — Assessment & Plan Note (Signed)
No pain on examination today Is the patient that her pain could be of musculoskeletal origin Continue Aspercreme as needed stated that Aspercreme  helps her pain.

## 2023-02-19 NOTE — Assessment & Plan Note (Signed)
Checking vitamin D levels and B12 Last vitamin D Lab Results  Component Value Date   VD25OH 15.0 (L) 11/20/2022

## 2023-02-19 NOTE — Patient Instructions (Signed)
For constipation it is important that you have an adequate intake of fruit and vegetables daily, at least 3 servings of each, as well as water intake of at least 48 ounces daily and regular exercise.  OTC stool softeners are helpful for daily use, up to 4 daily (eg. Colace)    Foods rich in vitamin D Cod liver oil,Salmon,Swordfish,Tuna fish,Orange juice fortified with vitamin D,Dairy and plant milks fortified with vitamin D,Sardines,Beef liver      1. Vitamin D deficiency  - Vitamin B12 - VITAMIN D 25 Hydroxy (Vit-D Deficiency, Fractures)  2. Fatigue, unspecified type  - Vitamin B12 - VITAMIN D 25 Hydroxy (Vit-D Deficiency, Fractures)  3. Need for hepatitis C screening test  - Hepatitis C antibody   Please consider getting your shingles vaccine and Tdap vaccines  It is important that you exercise regularly at least 30 minutes 5 times a week as tolerated  Think about what you will eat, plan ahead. Choose " clean, green, fresh or frozen" over canned, processed or packaged foods which are more sugary, salty and fatty. 70 to 75% of food eaten should be vegetables and fruit. Three meals at set times with snacks allowed between meals, but they must be fruit or vegetables. Aim to eat over a 12 hour period , example 7 am to 7 pm, and STOP after  your last meal of the day. Drink water,generally about 64 ounces per day, no other drink is as healthy. Fruit juice is best enjoyed in a healthy way, by EATING the fruit.  Thanks for choosing [facility] we consider it a privelige to serve you.

## 2023-02-19 NOTE — Assessment & Plan Note (Signed)
Her pain is most likely of musculoskeletal origin Continue Aspercreme as needed Also encouraged patient to take OTC stool softener as needed for constipation as constipation could be contributing to her pain.  May follow-up with GI as needed

## 2023-03-04 ENCOUNTER — Telehealth: Payer: Self-pay | Admitting: Internal Medicine

## 2023-03-04 MED ORDER — METOPROLOL SUCCINATE ER 25 MG PO TB24: 25.0000 mg | ORAL_TABLET | Freq: Two times a day (BID) | ORAL | 1 refills | Status: DC

## 2023-03-04 NOTE — Telephone Encounter (Signed)
Pt's medication was sent to pt's pharmacy as requested. Confirmation received.  °

## 2023-03-04 NOTE — Telephone Encounter (Signed)
 *  STAT* If patient is at the pharmacy, call can be transferred to refill team.   1. Which medications need to be refilled? (please list name of each medication and dose if known) metoprolol succinate (TOPROL XL) 25 MG 24 hr tablet    2. Would you like to learn more about the convenience, safety, & potential cost savings by using the [facility]? No    3. Are you open to using the [facility] (Type [facility]. no   4. Which pharmacy/location (including street and city if local pharmacy) is medication to be sent to?[id number] [place], [place] - [address]    5. Do they need a 30 day or 90 day supply? 90 days

## 2023-04-26 ENCOUNTER — Ambulatory Visit (INDEPENDENT_AMBULATORY_CARE_PROVIDER_SITE_OTHER): Payer: BC Managed Care – PPO | Admitting: Nurse Practitioner

## 2023-04-26 ENCOUNTER — Encounter: Payer: Self-pay | Admitting: Nurse Practitioner

## 2023-04-26 VITALS — BP 113/66 | HR 62 | Ht 59.0 in | Wt 163.2 lb

## 2023-04-26 DIAGNOSIS — K625 Hemorrhage of anus and rectum: Secondary | ICD-10-CM | POA: Insufficient documentation

## 2023-04-26 DIAGNOSIS — Z1211 Encounter for screening for malignant neoplasm of colon: Secondary | ICD-10-CM | POA: Diagnosis not present

## 2023-04-26 DIAGNOSIS — K5900 Constipation, unspecified: Secondary | ICD-10-CM

## 2023-04-26 DIAGNOSIS — R1012 Left upper quadrant pain: Secondary | ICD-10-CM | POA: Diagnosis not present

## 2023-04-26 MED ORDER — POLYETHYLENE GLYCOL 3350 17 GM/SCOOP PO POWD: ORAL | 0 refills | Status: DC

## 2023-04-26 MED ORDER — HYDROCORTISONE ACETATE 25 MG RE SUPP: 25.0000 mg | Freq: Two times a day (BID) | RECTAL | 0 refills | Status: DC

## 2023-04-26 NOTE — Assessment & Plan Note (Signed)
-   polyethylene glycol powder (GLYCOLAX/MIRALAX) 17 GM/SCOOP powder; 1 capful or pkt PO qd PRN; MAX 1 capful or pkt/day; dissolve in 4-8oz of liquid  Dispense: 255 g; Refill: 0

## 2023-04-26 NOTE — Patient Instructions (Addendum)
  1. Screening for colon cancer  - Cologuard  2. Left upper quadrant abdominal pain  - CT ABDOMEN PELVIS W CONTRAST; Future  3. Rectal bleeding - CBC - hydrocortisone (ANUSOL-HC) 25 MG suppository; Place 1 suppository (25 mg total) rectally 2 (two) times daily.  Dispense: 12 suppository; Refill: 0   For intermittent short-term use (ie, no more than 7 days) due to risk of mucosal thinning.   1. Screening for colon cancer  - Cologuard  2. Left upper quadrant abdominal pain  - CT ABDOMEN PELVIS W CONTRAST; Future  3. Rectal bleeding  - CBC    It is important that you exercise regularly at least 30 minutes 5 times a week as tolerated  Think about what you will eat, plan ahead. Choose " clean, green, fresh or frozen" over canned, processed or packaged foods which are more sugary, salty and fatty. 70 to 75% of food eaten should be vegetables and fruit. Three meals at set times with snacks allowed between meals, but they must be fruit or vegetables. Aim to eat over a 12 hour period , example 7 am to 7 pm, and STOP after  your last meal of the day. Drink water,generally about 64 ounces per day, no other drink is as healthy. Fruit juice is best enjoyed in a healthy way, by EATING the fruit.  Thanks for choosing [facility] we consider it a privelige to serve you.

## 2023-04-26 NOTE — Progress Notes (Signed)
Acute Office Visit  Subjective:     Patient ID: [name], female    DOB: 31-Jul-1965, 57 y.o.   MRN: [id number]  Chief Complaint  Patient presents with   Abdominal Pain    Abdominal Pain Pertinent negatives include no arthralgias, dysuria, fever, frequency, headaches, hematuria, myalgias, nausea or vomiting.   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 Patient is in today for complaints of rectal bleed that occurred 5 days ago.  Patient stated that  on that day she initially had severe stomach cramp and nausea after which she noticed a lot of blood with BM three times the first day and once the next day.  Her rectal bleed has since resolved but still has some cramping on her left upper abdominal area radiating to the flank and back .  She has called GI office but they cannot see her until December 2024.  She currently denies fever, chills, unintentional weight loss.  Last colonoscopy was in 2022, moderate-sized internal and external hemorrhoids with noted daily with a repeat recommended in 10 years.  She saw GI back in July and they had recommended taking MiraLAX as needed for constipation.    Review of Systems  Constitutional:  Negative for activity change, appetite change, chills, fatigue and fever.  HENT:  Negative for congestion, dental problem, ear discharge, ear pain, hearing loss, rhinorrhea, sinus pressure, sinus pain, sneezing and sore throat.   Eyes:  Negative for pain, discharge, redness and itching.  Respiratory:  Negative for cough,  chest tightness, shortness of breath and wheezing.   Cardiovascular:  Negative for chest pain, palpitations and leg swelling.  Gastrointestinal:  Positive for abdominal pain and blood in stool. Negative for abdominal distention, anal bleeding, nausea and vomiting.  Endocrine: Negative for cold intolerance, heat intolerance, polydipsia, polyphagia and polyuria.  Genitourinary:  Negative for difficulty urinating, dysuria, flank pain, frequency, hematuria, menstrual problem, pelvic pain and vaginal bleeding.  Musculoskeletal:  Negative for arthralgias, back pain, gait problem, joint swelling and myalgias.  Skin:  Negative for color change, pallor, rash and wound.  Allergic/Immunologic: Negative for environmental allergies, food allergies and immunocompromised state.  Neurological:  Negative for dizziness, tremors, facial asymmetry, weakness and headaches.  Hematological:  Negative for adenopathy. Does not bruise/bleed easily.  Psychiatric/Behavioral:  Negative for agitation, behavioral problems, confusion, decreased concentration, hallucinations, self-injury and suicidal ideas.         Objective:    BP 113/66   Pulse 62   Ht 4\' 11"  (1.499 m)   Wt 163 lb 3.2 oz (74 kg)   LMP 03/15/2014 (Exact Date)   SpO2 100%   BMI 32.96 kg/m    Physical Exam Vitals and nursing note reviewed. Exam conducted with a chaperone present.  Constitutional:      General: She is not in acute distress.    Appearance: Normal appearance. She is obese. She is not ill-appearing, toxic-appearing or diaphoretic.  HENT:     Mouth/Throat:     Mouth: Mucous membranes are moist.     Pharynx: Oropharynx is clear. No oropharyngeal exudate or posterior oropharyngeal erythema.  Eyes:     General: No scleral icterus.       Right eye:  No discharge.        Left eye: No discharge.     Extraocular Movements: Extraocular movements intact.     Conjunctiva/sclera: Conjunctivae normal.  Cardiovascular:     Rate and Rhythm:  Normal rate and regular rhythm.     Pulses: Normal pulses.     Heart sounds: Normal heart sounds. No murmur heard.    No friction rub. No gallop.  Pulmonary:     Effort: Pulmonary effort is normal. No respiratory distress.     Breath sounds: Normal breath sounds. No stridor. No wheezing, rhonchi or rales.  Chest:     Chest wall: No tenderness.  Abdominal:     General: There is no distension.     Palpations: Abdomen is soft.     Tenderness: There is abdominal tenderness. There is no right CVA tenderness, left CVA tenderness or guarding.     Comments: Mild tenderness on palpation of right upper abdominal quadrant and right flank area  Genitourinary:    Comments: No external Hemorid seen Musculoskeletal:        General: No swelling, tenderness, deformity or signs of injury.     Right lower leg: No edema.     Left lower leg: No edema.  Skin:    General: Skin is warm and dry.     Capillary Refill: Capillary refill takes less than 2 seconds.     Coloration: Skin is not jaundiced or pale.     Findings: No bruising, erythema or lesion.  Neurological:     Mental Status: She is alert and oriented to person, place, and time.     Motor: No weakness.     Coordination: Coordination normal.     Gait: Gait normal.  Psychiatric:        Mood and Affect: Mood normal.        Behavior: Behavior normal.        Thought Content: Thought content normal.        Judgment: Judgment normal.     No results found for any visits on 04/26/23.      Assessment & Plan:   Problem List Items Addressed This Visit       Digestive   Rectal bleeding - Primary    Could be due to hemorrhoids  - hydrocortisone (ANUSOL-HC) 25 MG suppository; Place 1 suppository (25 mg total) rectally 2 (two) times daily.  Dispense: 12 suppository; Refill: 0  - polyethylene glycol powder (GLYCOLAX/MIRALAX) 17 GM/SCOOP powder; 1 capful or pkt PO qd PRN; MAX 1 capful or pkt/day; dissolve in 4-8oz of liquid  Dispense: 255 g;  Refill: 0   Cologuard ordered to screen for colon cancer      Relevant Medications   hydrocortisone (ANUSOL-HC) 25 MG suppository   Other Relevant Orders   CBC     Other   Left upper quadrant abdominal pain    1. Screening for colon cancer  - Cologuard  2. Left upper quadrant abdominal pain  - CT ABDOMEN PELVIS W CONTRAST; Future  3. Rectal bleeding  - CBC - hydrocortisone (ANUSOL-HC) 25 MG suppository; Place 1 suppository (25 mg total) rectally 2 (two) times daily.  Dispense: 12 suppository; Refill: 0  4. Constipation, unspecified constipation type  - polyethylene glycol powder (GLYCOLAX/MIRALAX) 17 GM/SCOOP powder; 1 capful or pkt PO qd PRN; MAX 1 capful or pkt/day; dissolve in 4-8oz of liquid  Dispense: 255 g; Refill: 0        Relevant Orders   CT ABDOMEN PELVIS  W CONTRAST   Constipation     - polyethylene glycol powder (GLYCOLAX/MIRALAX) 17 GM/SCOOP powder; 1 capful or pkt PO qd PRN; MAX 1 capful or pkt/day; dissolve in 4-8oz of liquid  Dispense: 255 g; Refill: 0       Relevant Medications   polyethylene glycol powder (GLYCOLAX/MIRALAX) 17 GM/SCOOP powder   Other Visit Diagnoses     Screening for colon cancer       Relevant Orders   Cologuard       Meds ordered this encounter  Medications   hydrocortisone (ANUSOL-HC) 25 MG suppository    Sig: Place 1 suppository (25 mg total) rectally 2 (two) times daily.    Dispense:  12 suppository    Refill:  0   polyethylene glycol powder (GLYCOLAX/MIRALAX) 17 GM/SCOOP powder    Sig: 1 capful or pkt PO qd PRN; MAX 1 capful or pkt/day; dissolve in 4-8oz of liquid    Dispense:  255 g    Refill:  0    No follow-ups on file.  Donell Beers, FNP

## 2023-04-26 NOTE — Assessment & Plan Note (Signed)
Could be due to hemorrhoids  - hydrocortisone (ANUSOL-HC) 25 MG suppository; Place 1 suppository (25 mg total) rectally 2 (two) times daily.  Dispense: 12 suppository; Refill: 0  - polyethylene glycol powder (GLYCOLAX/MIRALAX) 17 GM/SCOOP powder; 1 capful or pkt PO qd PRN; MAX 1 capful or pkt/day; dissolve in 4-8oz of liquid  Dispense: 255 g; Refill: 0   Cologuard ordered to screen for colon cancer

## 2023-04-26 NOTE — Assessment & Plan Note (Signed)
1. Screening for colon cancer  - Cologuard  2. Left upper quadrant abdominal pain  - CT ABDOMEN PELVIS W CONTRAST; Future  3. Rectal bleeding  - CBC - hydrocortisone (ANUSOL-HC) 25 MG suppository; Place 1 suppository (25 mg total) rectally 2 (two) times daily.  Dispense: 12 suppository; Refill: 0  4. Constipation, unspecified constipation type  - polyethylene glycol powder (GLYCOLAX/MIRALAX) 17 GM/SCOOP powder; 1 capful or pkt PO qd PRN; MAX 1 capful or pkt/day; dissolve in 4-8oz of liquid  Dispense: 255 g; Refill: 0

## 2023-04-27 LAB — CBC
Hematocrit: 41.6 % (ref 34.0–46.6)
Hemoglobin: 12.9 g/dL (ref 11.1–15.9)
MCH: 24.7 pg — ABNORMAL LOW (ref 26.6–33.0)
MCHC: 31 g/dL — ABNORMAL LOW (ref 31.5–35.7)
MCV: 80 fL (ref 79–97)
Platelets: 324 10*3/uL (ref 150–450)
RBC: 5.23 x10E6/uL (ref 3.77–5.28)
RDW: 17.2 % — ABNORMAL HIGH (ref 11.7–15.4)
WBC: 6.4 10*3/uL (ref 3.4–10.8)

## 2023-04-29 ENCOUNTER — Encounter: Payer: Self-pay | Admitting: Nurse Practitioner

## 2023-05-14 ENCOUNTER — Ambulatory Visit
Admission: RE | Admit: 2023-05-14 | Discharge: 2023-05-14 | Disposition: A | Discharge status: Home / Self Care | Payer: BC Managed Care – PPO | Source: Ambulatory Visit | Attending: Nurse Practitioner | Admitting: Nurse Practitioner

## 2023-05-14 DIAGNOSIS — G8929 Other chronic pain: Secondary | ICD-10-CM | POA: Diagnosis not present

## 2023-05-14 DIAGNOSIS — R1012 Left upper quadrant pain: Secondary | ICD-10-CM

## 2023-05-14 DIAGNOSIS — R109 Unspecified abdominal pain: Secondary | ICD-10-CM | POA: Diagnosis not present

## 2023-05-14 MED ORDER — IOPAMIDOL (ISOVUE-370) INJECTION 76%
80.0000 mL | Freq: Once | INTRAVENOUS | Status: AC | PRN
  Administered 2023-05-14: 80 mL via INTRAVENOUS

## 2023-06-10 ENCOUNTER — Telehealth: Payer: Self-pay | Admitting: Internal Medicine

## 2023-06-10 NOTE — Telephone Encounter (Signed)
*  STAT* If patient is at the pharmacy, call can be transferred to refill team.   1. Which medications need to be refilled? (please list name of each medication and dose if known)   metoprolol succinate (TOPROL XL) 25 MG 24 hr tablet     2. Would you like to learn more about the convenience, safety, & potential cost savings by using the [facility]? No   3. Are you open to using the [facility] ([facility] No   4. Which pharmacy/location (including street and city if local pharmacy) is medication to be sent to?  [id number] - [place], [place] - [address]     5. Do they need a 30 day or 90 day supply? 90 day   Pt has scheduled appt on 12/3

## 2023-06-10 NOTE — Telephone Encounter (Signed)
Pt ordered RX too soon. Refused

## 2023-06-11 ENCOUNTER — Other Ambulatory Visit: Payer: Self-pay | Admitting: Nurse Practitioner

## 2023-06-11 DIAGNOSIS — Z1231 Encounter for screening mammogram for malignant neoplasm of breast: Secondary | ICD-10-CM

## 2023-06-25 ENCOUNTER — Ambulatory Visit: Payer: Self-pay | Admitting: Nurse Practitioner

## 2023-06-29 ENCOUNTER — Ambulatory Visit: Payer: BC Managed Care – PPO | Admitting: Internal Medicine

## 2023-07-13 ENCOUNTER — Ambulatory Visit: Payer: BC Managed Care – PPO | Attending: Internal Medicine | Admitting: Internal Medicine

## 2023-07-13 VITALS — BP 118/62 | HR 66 | Ht 59.0 in | Wt 167.6 lb

## 2023-07-13 DIAGNOSIS — I471 Supraventricular tachycardia, unspecified: Secondary | ICD-10-CM

## 2023-07-13 MED ORDER — FLECAINIDE ACETATE 50 MG PO TABS: 50.0000 mg | ORAL_TABLET | Freq: Two times a day (BID) | ORAL | 3 refills | Status: DC

## 2023-07-13 NOTE — Progress Notes (Signed)
HPI [name] returns today for followup of palpitations. She has a h/o SVT and underwent remote ablation. 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 She has been taking long acting toprol. She notes occasional peripheral edema. She has occasional break throughs but overall has felt well.  No Known Allergies   Current Outpatient Medications  Medication Sig Dispense Refill   flecainide (TAMBOCOR) 50 MG tablet Take 1 tablet (50 mg total) by mouth 2 (two) times daily. 180 tablet 3   metoprolol succinate (TOPROL XL) 25 MG 24 hr tablet Take 1 tablet (25 mg total) by mouth in the morning and at bedtime. 180 tablet 1   acyclovir (ZOVIRAX) 400 MG tablet Take 1 tablet (400 mg total) by mouth 3 (three) times daily. (Patient not taking: Reported on 07/13/2023) 30 tablet 5   Baclofen 5 MG TABS Take 1 tablet (5 mg total) by mouth 3 (three) times daily as needed. (Patient not taking: Reported on 07/13/2023) 15 tablet 0   cyanocobalamin (VITAMIN B12) 500 MCG tablet Take 500 mcg by mouth daily. (Patient not taking: Reported on 07/13/2023)     hydrocortisone (ANUSOL-HC) 25 MG suppository Place 1 suppository (25 mg total) rectally 2 (two) times daily. (Patient not taking: Reported on 07/13/2023) 12 suppository 0   pantoprazole (PROTONIX) 40 MG tablet TAKE 1 TABLET BY MOUTH DAILY AS NEEDED (Patient not taking: Reported on 07/13/2023) 90 tablet 1   polyethylene glycol powder (GLYCOLAX/MIRALAX) 17 GM/SCOOP powder 1 capful or pkt PO qd PRN; MAX 1 capful or pkt/day; dissolve in 4-8oz of liquid (Patient not taking: Reported on 07/13/2023) 255 g 0   No current facility-administered medications for this visit.     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   Chest pain    CHEST PAIN 11/13/2008   Qualifier: Diagnosis of  By: Flonnie Overman     Chronic anemia     Difficulty sleeping    Dysmenorrhea 2011   Dysrhythmia    irregular heart beats   Fibroids 09/20/2013   Heart palpitations    History of abdominal hysterectomy 03/2014   History of uterine fibroid    Menorrhagia    Palpitations 09/23/2006   Qualifier: Diagnosis of  By: Levada Schilling     Shortness of breath    "associated w/high heart rate" (01/04/2013)   SUPRAVENTRICULAR TACHYCARDIA 11/13/2008   Qualifier: Diagnosis of  By: Flonnie Overman     SVT (supraventricular tachycardia)     ROS:   All systems reviewed and negative except as noted in the HPI.   Past Surgical History:  Procedure Laterality Date   ABDOMINAL HYSTERECTOMY     AUGMENTATION MAMMAPLASTY Bilateral    CARDIAC ELECTROPHYSIOLOGY MAPPING AND ABLATION  ~ 2008; 01/04/2013   "weren't able to do the ablation" (01/04/2013)   COLONOSCOPY  12/12/2013   COMBINED AUGMENTATION MAMMAPLASTY AND ABDOMINOPLASTY Bilateral 2002   ELECTROPHYSIOLOGY STUDY N/A 01/04/2013   Procedure: ELECTROPHYSIOLOGY STUDY;  Surgeon: Marinus Maw, MD;  Location[facility] C[facility] LAB;  Service: Cardiovascular;  Laterality: N/A;   SUPRAVENTRICULAR TACHYCARDIA ABLATION N/A 01/04/2013   Procedure: SUPRAVENTRICULAR TACHYCARDIA ABLATION;  Surgeon: Marinus Maw, MD;  Location: [facility] CATH LAB;  Service: Cardiovascular;  Laterality: N/A;   TUBAL LIGATION     UTERINE FIBROID SURGERY  ?09/2012   VAGINAL HYSTERECTOMY Bilateral 03/27/2014  Procedure: HYSTERECTOMY VAGINAL WITH BILATERAL SALPINGECTOMY;  Surgeon: Willodean Rosenthal, MD;  Location: [facility]S;  Service: Gynecology;  Laterality: Bilateral;     Family History  Problem Relation Age of Onset   Cancer Brother    Cancer Paternal Aunt        x 2 unkown type   Breast cancer Paternal Aunt    Heart disease Maternal Grandmother    Heart Problems Mother        apid heartbeat   Breast cancer Sister 8   Breast cancer Paternal Grandmother    Colon cancer Neg Hx      Social History   Socioeconomic History    Marital status: Legally Separated    Spouse name: Not on file   Number of children: 4   Years of education: Not on file   Highest education level: Not on file  Occupational History   Occupation: order Education officer, community: [employer]  Tobacco Use   Smoking status: Former    Current packs/day: 0.00    Average packs/day: 0.3 packs/day for 3.0 years (1.0 ttl pk-yrs)    Types: Cigarettes    Start date: 12/27/1981    Quit date: 12/27/1984    Years since quitting: 38.5   Smokeless tobacco: Never  Vaping Use   Vaping status: Never Used  Substance and Sexual Activity   Alcohol use: Yes    Comment: occ   Drug use: Not Currently    Types: Marijuana    Comment: every other week   Sexual activity: Yes    Birth control/protection: None  Other Topics Concern   Not on file  Social History Narrative   Lives home alone   Social Drivers of Health   Financial Resource Strain: Not on file  Food Insecurity: Not on file  Transportation Needs: Not on file  Physical Activity: Not on file  Stress: Not on file  Social Connections: Not on file  Intimate Partner Violence: Not on file     BP 118/62   Pulse 66   Ht 4\' 11"  (1.499 m)   Wt 167 lb 9.6 oz (76 kg)   LMP 03/15/2014 (Exact Date)   SpO2 93%   BMI 33.85 kg/m   Physical Exam:  Well appearing NAD HEENT: Unremarkable Neck:  No JVD, no thyromegally Lymphatics:  No adenopathy Back:  No CVA tenderness Lungs:  Clear HEART:  Regular rate rhythm, no murmurs, no rubs, no clicks Abd:  soft, positive bowel sounds, no organomegally, no rebound, no guarding Ext:  2 plus pulses, no edema, no cyanosis, no clubbing Skin:  No rashes no nodules Neuro:  CN II through XII intact, motor grossly intact  EKG - nsr   Assess/Plan: Recurrent SVT - she was not inducible at EP study years ago for sustained SVT. Only non-sustained. I have discussed the treatment options and I would like to have her continue the beta blocker and add flecainde. We  will recheck her ECG.  Sharlot Gowda Robin Mcgloin,MD

## 2023-07-13 NOTE — Patient Instructions (Addendum)
Medication Instructions:  Your physician has recommended you make the following change in your medication:  Start flecainide 50 mg twice daily.  Lab Work: None ordered.  If you have labs (blood work) drawn today and your tests are completely normal, you will receive your results only by: MyChart Message (if you have MyChart) OR A paper copy in the mail If you have any lab test that is abnormal or we need to change your treatment, we will call you to review the results.  Testing/Procedures: None ordered.  Follow-Up: At [facility], you and your health needs are our priority.  As part of our continuing mission to provide you with exceptional heart care, we have created designated Provider Care Teams.  These Care Teams include your primary Cardiologist (physician) and Advanced Practice Providers (APPs -  Physician Assistants and Nurse Practitioners) who all work together to provide you with the care you need, when you need it.  Your next appointment:   2 weeks for Nurse visit EKG  4 months follow up  The format for your next appointment:   In Person  Provider:   Lewayne Bunting, MD{or one of the following Advanced Practice Providers on your designated Care Team:   Francis Dowse, [place] Casimiro Needle "Mardelle Matte" [place], [place] Earnest Rosier, NP    Important Information About Sugar

## 2023-07-14 ENCOUNTER — Encounter: Payer: Self-pay | Admitting: Gastroenterology

## 2023-07-14 ENCOUNTER — Ambulatory Visit (INDEPENDENT_AMBULATORY_CARE_PROVIDER_SITE_OTHER): Payer: BC Managed Care – PPO | Admitting: Gastroenterology

## 2023-07-14 VITALS — BP 110/74 | HR 76 | Ht 59.0 in | Wt 167.0 lb

## 2023-07-14 DIAGNOSIS — K5909 Other constipation: Secondary | ICD-10-CM

## 2023-07-14 DIAGNOSIS — K219 Gastro-esophageal reflux disease without esophagitis: Secondary | ICD-10-CM

## 2023-07-14 DIAGNOSIS — K5900 Constipation, unspecified: Secondary | ICD-10-CM

## 2023-07-14 DIAGNOSIS — R109 Unspecified abdominal pain: Secondary | ICD-10-CM | POA: Diagnosis not present

## 2023-07-14 DIAGNOSIS — R10A2 Flank pain, left side: Secondary | ICD-10-CM

## 2023-07-14 MED ORDER — FAMOTIDINE 20 MG PO TABS: 20.0000 mg | ORAL_TABLET | Freq: Two times a day (BID) | ORAL | 5 refills | Status: DC

## 2023-07-14 MED ORDER — POLYETHYLENE GLYCOL 3350 17 GM/SCOOP PO POWD: ORAL | 0 refills | Status: AC

## 2023-07-14 NOTE — Patient Instructions (Addendum)
Start Miralax 1 capful daily in 8 ounces of liquid.  We have sent the following medications to your pharmacy for you to pick up at your convenience: Pepcid 20 mg twice daily.   Your provider has ordered "Diatherix" stool testing for you. You have received a kit from our office today containing all necessary supplies to complete this test. Please carefully read the stool collection instructions provided in the kit before opening the accompanying materials. In addition, be sure to place the label from the top right corner of the laboratory request sheet onto the "puritan opti-swab" tube that is supplied in the kit. This label should include your full name and date of birth. After completing the test, you should secure the purtian tube into the specimen biohazard bag. The laboratory request information sheet (including date and time of specimen collection) should be placed into the outside pocket of the specimen biohazard bag and returned to the [facility] lab with 2 days of collection.   If the laboratory information sheet specimen date and time are not filled out, the test will NOT be performed.   _______________________________________________________  If your blood pressure at your visit was 140/90 or greater, please contact your primary care physician to follow up on this.  _______________________________________________________  If you are age 95 or older, your body mass index should be between 23-30. Your Body mass index is 33.73 kg/m. If this is out of the aforementioned range listed, please consider follow up with your Primary Care Provider.  If you are age 64 or younger, your body mass index should be between 19-25. Your Body mass index is 33.73 kg/m. If this is out of the aformentioned range listed, please consider follow up with your Primary Care Provider.   ________________________________________________________  The [facility] providers would like to encourage you to use [facility] to  communicate with providers for non-urgent requests or questions.  Due to long hold times on the telephone, sending your provider a message by [facility] may be a faster and more efficient way to get a response.  Please allow 48 business hours for a response.  Please remember that this is for non-urgent requests.  _______________________________________________________

## 2023-07-14 NOTE — Progress Notes (Signed)
Patient was seen in conjunction with [facility], NP.  Plan was made together.

## 2023-07-14 NOTE — Progress Notes (Signed)
Chief Complaint:  Left flank/side pain  HPI: 57 year old female patient with medical history of constipation, GERD, and SVT, who is a patient of  Paseda, Baird Kay, FNP with main complaint today of chronic left flank pain, reflux, and constipation.   11/29/2022 ER visit for flank pain and at that time described intermittent flank pain for several years diagnosed musculoskeletal issue but the pain would come and go intermittently.  She took Aleve for the symptoms.  At that time thought constipation was contributing.  She was given Baclofen and MiraLAX.  On 01/27/2023 patient was seen in our office by Victorino Dike, [place] for left flank pain that at that time was relieved with Baclofen she was given during ED visit. She also stated taking the Miralax helped with decreasing pain. At that time Victorino Dike recommend she continue daily Miralax. It was suspected her pain was most likely MSK in nature.  Interval History:   Patient reports history of GERD, taking Pantoprazole 40 mg prn and OTC Tums prn, reports if she takes PPI therapy daily she feels unwell and experiences dysuria.    Patient complains of chronic left flank pain over several years. No known food triggers.The pain is with/without eating. She cannot recall if the PPI therapy helps with the pain. She did confirm taking the baclofen in the past helped the pain. She does state working out at [employer] makes the pain worse.    Lastly, patient has history of constipation and is currently taking Miralax prn. She will report bowel movement every 2-3 days. Reports she is afraid to take Miralax daily with work.   Recent CT scan of the abdomen and pelvis with contrast was unremarkable.  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  HPV 02/07/2012   Chest pain    CHEST PAIN 11/13/2008   Qualifier: Diagnosis of  By: Flonnie Overman     Chronic anemia    Difficulty sleeping    Dysmenorrhea 2011   Dysrhythmia    irregular heart beats   Fibroids 09/20/2013   Heart palpitations    History of abdominal hysterectomy 03/2014   History of uterine fibroid    Menorrhagia    Palpitations 09/23/2006   Qualifier: Diagnosis of  By: Levada Schilling     Shortness of breath    "associated w/high heart rate" (01/04/2013)   SUPRAVENTRICULAR TACHYCARDIA 11/13/2008   Qualifier: Diagnosis of  By: Flonnie Overman     SVT (supraventricular tachycardia) [facility])    Past Surgical History:  Procedure Laterality Date   ABDOMINAL HYSTERECTOMY     AUGMENTATION MAMMAPLASTY Bilateral    CARDIAC ELECTROPHYSIOLOGY MAPPING AND ABLATION  ~ 2008; 01/04/2013   "weren't able to do the ablation" (01/04/2013)   COLONOSCOPY  12/12/2013   COMBINED AUGMENTATION MAMMAPLASTY AND ABDOMINOPLASTY Bilateral 2002   ELECTROPHYSIOLOGY STUDY N/A 01/04/2013   Procedure: ELECTROPHYSIOLOGY STUDY;  Surgeon: Marinus Maw, MD;  Location: [facility] CATH LAB;  Service: Cardiovascular;  Laterality: N/A;   SUPRAVENTRICULAR TACHYCARDIA ABLATION N/A 01/04/2013   Procedure: SUPRAVENTRICULAR TACHYCARDIA ABLATION;  Surgeon: Marinus Maw, MD;  Location: [facility] CATH LAB;  Service: Cardiovascular;  Laterality: N/A;   TUBAL LIGATION     UTERINE FIBROID  SURGERY  ?09/2012   VAGINAL HYSTERECTOMY Bilateral 03/27/2014   Procedure: HYSTERECTOMY VAGINAL WITH BILATERAL SALPINGECTOMY;  Surgeon: Willodean Rosenthal, MD;  Location: [facility] ORS;  Service: Gynecology;  Laterality: Bilateral;    Current Outpatient Medications  Medication Sig Dispense Refill   flecainide (TAMBOCOR) 50 MG tablet Take 1 tablet (50 mg total) by mouth 2 (two) times daily. 180 tablet 3   metoprolol succinate (TOPROL XL) 25 MG 24 hr tablet Take 1 tablet (25 mg total) by mouth in the morning and at bedtime. 180 tablet 1   acyclovir  (ZOVIRAX) 400 MG tablet Take 1 tablet (400 mg total) by mouth 3 (three) times daily. (Patient not taking: Reported on 04/26/2023) 30 tablet 5   Baclofen 5 MG TABS Take 1 tablet (5 mg total) by mouth 3 (three) times daily as needed. (Patient not taking: Reported on 01/27/2023) 15 tablet 0   cyanocobalamin (VITAMIN B12) 500 MCG tablet Take 500 mcg by mouth daily. (Patient not taking: Reported on 04/26/2023)     hydrocortisone (ANUSOL-HC) 25 MG suppository Place 1 suppository (25 mg total) rectally 2 (two) times daily. (Patient not taking: Reported on 07/14/2023) 12 suppository 0   pantoprazole (PROTONIX) 40 MG tablet TAKE 1 TABLET BY MOUTH DAILY AS NEEDED (Patient not taking: Reported on 04/26/2023) 90 tablet 1   polyethylene glycol powder (GLYCOLAX/MIRALAX) 17 GM/SCOOP powder 1 capful or pkt PO qd PRN; MAX 1 capful or pkt/day; dissolve in 4-8oz of liquid (Patient not taking: Reported on 07/14/2023) 255 g 0   No current facility-administered medications for this visit.   Allergies as of 07/14/2023   (No Known Allergies)   Family History  Problem Relation Age of Onset   Cancer Brother    Cancer Paternal Aunt        x 2 unkown type   Breast cancer Paternal Aunt    Heart disease Maternal Grandmother    Heart Problems Mother        apid heartbeat   Breast cancer Sister 60   Breast cancer Paternal Grandmother    Colon cancer Neg Hx    Review of Systems:    Constitutional: No weight loss, fever, chills, weakness or fatigue HEENT: Eyes: No change in vision               Ears, Nose, Throat:  No change in hearing or congestion Skin: No rash or itching Cardiovascular: No chest pain, chest pressure or palpitations   Respiratory: No SOB or cough Gastrointestinal: See HPI and otherwise negative Genitourinary: No dysuria or change in urinary frequency Neurological: No headache, dizziness or syncope Musculoskeletal: No new muscle or joint pain Hematologic: No bleeding or bruising Psychiatric: No  history of depression or anxiety   Physical Exam:  Vital signs: BP 110/74 (BP Location: Left Arm, Patient Position: Sitting, Cuff Size: Normal)   Pulse 76   Ht 4\' 11"  (1.499 m)   Wt 167 lb (75.8 kg)   LMP 03/15/2014 (Exact Date)   SpO2 96%   BMI 33.73 kg/m   Constitutional:   Pleasant African American female appears to be in NAD, Well developed, Well nourished, alert and cooperative  Respiratory: Respirations even and unlabored. Lungs clear to auscultation bilaterally.   No wheezes, crackles, or rhonchi.  Cardiovascular: Normal S1, S2. Regular rate and rhythm. No peripheral edema, cyanosis or pallor.  Gastrointestinal:  Soft, slightly distended, nontender. No rebound or guarding. Hypoactive bowel sounds. No appreciable masses or hepatomegaly. Rectal:  Not performed.  Msk:  Symmetrical without gross  deformities. Without edema, no deformity or joint abnormality.  Skin:   Dry and intact without significant lesions or rashes.  RELEVANT LABS AND IMAGING: CBC    Component Value Date/Time   WBC 6.4 04/26/2023 1043   WBC 7.5 12/12/2020 0829   RBC 5.23 04/26/2023 1043   RBC 5.44 (H) 12/12/2020 0829   HGB 12.9 04/26/2023 1043   HCT 41.6 04/26/2023 1043   PLT 324 04/26/2023 1043   MCV 80 04/26/2023 1043   MCH 24.7 (L) 04/26/2023 1043   MCH 24.4 (L) 12/12/2020 0829   MCHC 31.0 (L) 04/26/2023 1043   MCHC 33.7 12/12/2020 0829   RDW 17.2 (H) 04/26/2023 1043   LYMPHSABS 2.9 11/20/2022 0931   MONOABS 0.4 08/02/2017 0922   EOSABS 0.1 11/20/2022 0931   BASOSABS 0.1 11/20/2022 0931    CMP     Component Value Date/Time   NA 139 11/20/2022 0931   K 4.1 11/20/2022 0931   CL 105 11/20/2022 0931   CO2 21 11/20/2022 0931   GLUCOSE 94 11/20/2022 0931   GLUCOSE 123 (H) 12/12/2020 0829   BUN 7 11/20/2022 0931   CREATININE 0.55 (L) 11/20/2022 0931   CALCIUM 9.3 11/20/2022 0931   PROT 7.4 11/20/2022 0931   ALBUMIN 4.3 11/20/2022 0931   AST 23 11/20/2022 0931   ALT 26 11/20/2022 0931    ALKPHOS 101 11/20/2022 0931   BILITOT 0.3 11/20/2022 0931   GFRNONAA >60 12/12/2020 0829   GFRAA >60 08/02/2017 0922   05/14/2023 CT Abd/pelvis with contrast IMPRESSION: No acute intra-abdominal or pelvic pathology.  12/12/2013 colonoscopy with moderate sized internal and external hemorrhoids and otherwise normal.  Repeat recommended in 10 years.    12/12/2020 CT of the abdomen pelvis without contrast with no acute findings in the abdomen or pelvis.    11/20/2022 labs iron studies normal, vitamin D low at 15, TSH normal, hemoglobin A1c 6.1, CMP and CBC normal.    11/29/2022 abdominal x-ray with unremarkable bowel gas pattern.    Assessment: Encounter Diagnoses  Name Primary?   Left flank pain Yes   Chronic constipation    Gastroesophageal reflux disease, unspecified whether esophagitis present     57 year old female patient presents with main complaint of chronic left flank pain worsened with movement (exercise) relieved by muscle relaxants, not associated with eating and not improved with PPI therapy. CT scan abdomen and pelvis unremarkable. Lab work WNL. UTD on colon screening. Will order H.pylori breath test. Does have frequent reflux but does not like the way that she feels when she takes the pantoprazole so does not use it often,  We will trial Pepcid for her GERD to see if it controls her symptoms without side effects. As well as we talked about maximizing her constipation regimen to see if this would help with the discomfort.  She has miralax but does not take it regularly.  Plan: -Switch to Pepcid 20 mg twice daily, send prescription. -Start Miralax 1/2-1 capful po daily versus prn. - order [employer] H. Pylori test -We discussed good body mechanics at work and avoid sitting for long periods of times. -Screening for colorectal cancer due in 11/2023, recall is in the computer. -Otherwise her flank pain seems more likely to be musculoskeletal so needs to seek further evaluation and  treatment with her PCP.  Averyana Pillars, FNP-C [facility] 07/14/2023, 11:10 AM  Cc: Donell Beers, FNP

## 2023-07-14 NOTE — Progress Notes (Deleted)
07/14/2023 SEPHIRA VERISSIMO [id number] 02-18-1966   HISTORY OF PRESENT ILLNESS: ***         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   Chest pain    CHEST PAIN 11/13/2008   Qualifier: Diagnosis of  By: Flonnie Overman     Chronic anemia    Difficulty sleeping    Dysmenorrhea 2011   Dysrhythmia    irregular heart beats   Fibroids 09/20/2013   Heart palpitations    History of abdominal hysterectomy 03/2014   History of uterine fibroid    Menorrhagia    Palpitations 09/23/2006   Qualifier: Diagnosis of  By: Levada Schilling     Shortness of breath    "associated w/high heart rate" (01/04/2013)   SUPRAVENTRICULAR TACHYCARDIA 11/13/2008   Qualifier: Diagnosis of  By: Flonnie Overman     SVT (supraventricular tachycardia) [facility])    Past Surgical History:  Procedure Laterality Date   ABDOMINAL HYSTERECTOMY     AUGMENTATION MAMMAPLASTY Bilateral    CARDIAC ELECTROPHYSIOLOGY MAPPING AND ABLATION  ~ 2008; 01/04/2013   "weren't able to do the ablation" (01/04/2013)   COLONOSCOPY  12/12/2013   COMBINED AUGMENTATION MAMMAPLASTY AND ABDOMINOPLASTY Bilateral 2002   ELECTROPHYSIOLOGY STUDY N/A 01/04/2013   Procedure: ELECTROPHYSIOLOGY STUDY;  Surgeon: Marinus Maw, MD;  Location: [facility] CATH LAB;  Service: Cardiovascular;  Laterality: N/A;   SUPRAVENTRICULAR TACHYCARDIA ABLATION N/A 01/04/2013   Procedure: SUPRAVENTRICULAR TACHYCARDIA ABLATION;  Surgeon: Marinus Maw, MD;  Location: [facility] CATH LAB;  Service: Cardiovascular;  Laterality: N/A;   TUBAL LIGATION     UTERINE FIBROID SURGERY  ?09/2012   VAGINAL HYSTERECTOMY Bilateral 03/27/2014   Procedure: HYSTERECTOMY VAGINAL WITH BILATERAL SALPINGECTOMY;  Surgeon: Willodean Rosenthal, MD;  Location: [facility] ORS;  Service: Gynecology;  Laterality: Bilateral;    reports that she quit smoking about 38 years ago. Her smoking use  included cigarettes. She started smoking about 41 years ago. She has a 1 pack-year smoking history. She has never used smokeless tobacco. She reports current alcohol use. She reports that she does not currently use drugs after having used the following drugs: Marijuana. family history includes Breast cancer in her paternal aunt and paternal grandmother; Breast cancer (age of onset: 75) in her sister; Cancer in her brother and paternal aunt; Heart Problems in her mother; Heart disease in her maternal grandmother. No Known Allergies    Outpatient Encounter Medications as of 07/14/2023  Medication Sig   flecainide (TAMBOCOR) 50 MG tablet Take 1 tablet (50 mg total) by mouth 2 (two) times daily.   metoprolol succinate (TOPROL XL) 25 MG 24 hr tablet Take 1 tablet (25 mg total) by mouth in the morning and at bedtime.   acyclovir (ZOVIRAX) 400 MG tablet Take 1 tablet (400 mg total) by mouth 3 (three) times daily. (Patient not taking: Reported on 04/26/2023)   Baclofen 5 MG TABS Take 1 tablet (5 mg total) by mouth 3 (three) times daily as needed. (Patient not taking: Reported on 01/27/2023)   cyanocobalamin (VITAMIN B12) 500 MCG tablet Take 500 mcg by mouth daily. (Patient not taking: Reported on 04/26/2023)   hydrocortisone (ANUSOL-HC) 25 MG suppository Place 1 suppository (25 mg total) rectally 2 (two) times daily. (Patient not taking: Reported on 07/14/2023)   pantoprazole (PROTONIX) 40 MG tablet TAKE 1 TABLET BY MOUTH DAILY AS NEEDED (Patient  not taking: Reported on 04/26/2023)   polyethylene glycol powder (GLYCOLAX/MIRALAX) 17 GM/SCOOP powder 1 capful or pkt PO qd PRN; MAX 1 capful or pkt/day; dissolve in 4-8oz of liquid (Patient not taking: Reported on 07/14/2023)   No facility-administered encounter medications on file as of 07/14/2023.     REVIEW OF SYSTEMS  : All other systems reviewed and negative except where noted in the History of Present Illness.   PHYSICAL EXAM: BP 110/74 (BP Location: Left  Arm, Patient Position: Sitting, Cuff Size: Normal)   Pulse 76   Ht 4\' 11"  (1.499 m)   Wt 167 lb (75.8 kg)   LMP 03/15/2014 (Exact Date)   SpO2 96%   BMI 33.73 kg/m  General: Well developed white female in no acute distress Head: Normocephalic and atraumatic Eyes:  sclerae anicteric,conjunctive pink. Ears: Normal auditory acuity Neck: Supple, no masses.  Lungs: Clear throughout to auscultation Heart: Regular rate and rhythm Abdomen: Soft, nontender, non distended. No masses or hepatomegaly noted. Normal bowel sounds Rectal: *** Musculoskeletal: Symmetrical with no gross deformities  Skin: No lesions on visible extremities Extremities: No edema  Neurological: Alert oriented x 4, grossly nonfocal Cervical Nodes:  No significant cervical adenopathy Psychological:  Alert and cooperative. Normal mood and affect  ASSESSMENT AND PLAN:    CC:  Donell Beers, FNP

## 2023-07-22 ENCOUNTER — Ambulatory Visit: Payer: BC Managed Care – PPO

## 2023-07-27 NOTE — Telephone Encounter (Signed)
error 

## 2023-07-29 ENCOUNTER — Ambulatory Visit: Payer: BC Managed Care – PPO | Attending: Cardiovascular Disease

## 2023-07-29 VITALS — BP 112/72 | HR 61 | Resp 16 | Ht 59.0 in | Wt 171.2 lb

## 2023-07-29 DIAGNOSIS — I471 Supraventricular tachycardia, unspecified: Secondary | ICD-10-CM | POA: Diagnosis not present

## 2023-07-29 NOTE — Progress Notes (Signed)
   Nurse Visit   Date of Encounter: 07/29/2023 ID: [name], DOB 11-Feb-1966, MRN [id number]  PCP:  Paseda, Folashade R, FNP   [facility] Providers Cardiologist:  Oneil Parchment, MD      Visit Details   VS:  BP 112/72 (BP Location: Left Arm, Patient Position: Sitting, Cuff Size: Normal)   Pulse 61   Resp 16   Ht 4' 11 (1.499 m)   Wt 171 lb 3.2 oz (77.7 kg)   LMP 03/15/2014 (Exact Date)   SpO2 98%   BMI 34.58 kg/m  , BMI Body mass index is 34.58 kg/m.  Wt Readings from Last 3 Encounters:  07/29/23 171 lb 3.2 oz (77.7 kg)  07/14/23 167 lb (75.8 kg)  07/13/23 167 lb 9.6 oz (76 kg)     Reason for visit: EKG Performed today: Vitals, EKG, Provider consulted:Dr. Wonda, and Education Changes (medications, testing, etc.): none Length of Visit: 20 minutes    Medications Adjustments/Labs and Tests Ordered: Orders Placed This Encounter  Procedures   EKG 12-Lead   No orders of the defined types were placed in this encounter. Patient states she hasn't noticed a decrease in episodes since starting flecainide . Patient also feels that the metoprolol  tartrate 50mg  tablets worked better for her and asked for a prescription for breakthru episodes, currently on metoprolol  succinate 25 mg BID.  Will forward to Dr Waddell for possible medication adjustments/additions.   Signed, Harlene JONELLE Gash, RN  07/29/2023 3:42 PM

## 2023-08-02 ENCOUNTER — Telehealth: Payer: Self-pay

## 2023-08-02 MED ORDER — METOPROLOL TARTRATE 50 MG PO TABS: ORAL_TABLET | ORAL | 3 refills | Status: DC

## 2023-08-02 NOTE — Telephone Encounter (Signed)
 Spoke with patient, sent in metoprolol  tartrate 50 mg as needed for break thru episodes. Patient thankful for the follow up - no needs at this time    Waddell Danelle ORN, MD  Philis Harlene SAUNDERS, RN Ok to prescribe the metoprolol  for break thru symptoms. GT       Previous Messages    ----- Message ----- From: Philis Harlene SAUNDERS, RN Sent: 07/29/2023   4:09 PM EST To: Danelle ORN Waddell, MD; Ozell Fell, MD  Dr. Waddell, please see comments in nurse visit note - patient asking for metoprolol  tart 50 mg for breakthru

## 2023-08-20 ENCOUNTER — Telehealth: Payer: Self-pay | Admitting: *Deleted

## 2023-08-20 NOTE — Telephone Encounter (Signed)
Called patient.Unable to leave message.Voice mailbox not set up

## 2023-08-20 NOTE — Telephone Encounter (Signed)
-----   Message from Kaltag D. Zehr sent at 08/17/2023  5:17 PM EST ----- Please let the patient know that her H. pylori testing was negative.  How is she doing since increasing her Pepcid to twice daily and doing the MiraLAX on a regular basis?

## 2023-09-29 ENCOUNTER — Ambulatory Visit
Admission: RE | Admit: 2023-09-29 | Discharge: 2023-09-29 | Disposition: A | Discharge status: Home / Self Care | Payer: BC Managed Care – PPO | Source: Ambulatory Visit | Attending: Nurse Practitioner | Admitting: Nurse Practitioner

## 2023-09-29 DIAGNOSIS — Z1231 Encounter for screening mammogram for malignant neoplasm of breast: Secondary | ICD-10-CM | POA: Diagnosis not present

## 2023-11-04 ENCOUNTER — Other Ambulatory Visit: Payer: Self-pay | Admitting: Internal Medicine

## 2023-11-10 NOTE — Progress Notes (Unsigned)
  Electrophysiology Office Note:   Date:  11/11/2023  ID:  [name], DOB [birth year], MRN [id number]  Primary Cardiologist: Dorothye Gathers, MD Electrophysiologist: Manya Sells, MD      History of Present Illness:   [name] is a 58 y.o. female with h/o SVT with remote ablation and AT seen today for routine electrophysiology followup.   Since last being seen in our clinic the patient reports doing OK. She has breakthrough palpitations 2-3 times a week. Sometimes they keep her awake.  No specific HR reported, just a feeling of racing and unease. Otherwise, she denies chest pain, dyspnea, PND, orthopnea, nausea, vomiting, dizziness, syncope, edema, weight gain, or early satiety.   Review of systems complete and found to be negative unless listed in HPI.   EP Information / Studies Reviewed:    EKG is ordered today. Personal review as below.  EKG Interpretation Date/Time:  Thursday November 11 2023 09:13:54 EDT Ventricular Rate:  62 PR Interval:  194 QRS Duration:  76 QT Interval:  434 QTC Calculation: 440 R Axis:   67  Text Interpretation: Normal sinus rhythm Normal ECG When compared with ECG of 29-Jul-2023 15:28, No significant change was found Confirmed by Pilar Bridge [phone]) on 11/11/2023 9:19:44 AM    Arrhythmia/Device History No specialty comments available.   Physical Exam:   VS:  BP 120/80   Pulse 62   Ht 4\' 11"  (1.499 m)   Wt 167 lb 9.6 oz (76 kg)   LMP 03/15/2014 (Exact Date)   SpO2 97%   BMI 33.85 kg/m    Wt Readings from Last 3 Encounters:  11/11/23 167 lb 9.6 oz (76 kg)  07/29/23 171 lb 3.2 oz (77.7 kg)  07/14/23 167 lb (75.8 kg)     GEN: No acute distress NECK: No JVD; No carotid bruits CARDIAC: Regular rate and rhythm, no murmurs, rubs, gallops RESPIRATORY:  Clear to auscultation without rales, wheezing or rhonchi  ABDOMEN: Soft, non-tender, non-distended EXTREMITIES:  No edema; No deformity   ASSESSMENT AND PLAN:    SVT AT With prior  ablation 2008 Was not inducible at prior EP stud 2014.  Increase toprol to 25 mg q am and 50 mg q pm (She works third shift so would take the 50 mg dose at her BEDTIME) Continue flecainide 50 mg BID  Follow up with EP APP in 6 months  Signed, Tylene Galla, PA-C

## 2023-11-11 ENCOUNTER — Other Ambulatory Visit: Payer: Self-pay | Admitting: Nurse Practitioner

## 2023-11-11 ENCOUNTER — Ambulatory Visit: Payer: BC Managed Care – PPO | Attending: Pulmonary Disease | Admitting: Student

## 2023-11-11 ENCOUNTER — Encounter: Payer: Self-pay | Admitting: Student

## 2023-11-11 VITALS — BP 120/80 | HR 62 | Ht 59.0 in | Wt 167.6 lb

## 2023-11-11 DIAGNOSIS — I4719 Other supraventricular tachycardia: Secondary | ICD-10-CM

## 2023-11-11 DIAGNOSIS — I471 Supraventricular tachycardia, unspecified: Secondary | ICD-10-CM

## 2023-11-11 MED ORDER — METOPROLOL SUCCINATE ER 25 MG PO TB24: ORAL_TABLET | ORAL | 3 refills | Status: DC

## 2023-11-11 NOTE — Patient Instructions (Signed)
 Medication Instructions:  Increase metoprolol succinate (Toprol XL) to taking 25 mg in the morning and 50 mg at bedtime. *If you need a refill on your cardiac medications before your next appointment, please call your pharmacy*  Lab Work: None ordered If you have labs (blood work) drawn today and your tests are completely normal, you will receive your results only by: MyChart Message (if you have MyChart) OR A paper copy in the mail If you have any lab test that is abnormal or we need to change your treatment, we will call you to review the results.  Follow-Up: At [facility], you and your health needs are our priority.  As part of our continuing mission to provide you with exceptional heart care, our providers are all part of one team.  This team includes your primary Cardiologist (physician) and Advanced Practice Providers or APPs (Physician Assistants and Nurse Practitioners) who all work together to provide you with the care you need, when you need it.  Your next appointment:   6 month(s)  Provider:   You will see one of the following Advanced Practice Providers on your designated Care Team:   Mertha Abrahams, [place] Joycelyn Noa" [place], [place] Creighton Doffing, NP    We recommend signing up for the patient portal called "MyChart".  Sign up information is provided on this After Visit Summary.  MyChart is used to connect with patients for Virtual Visits (Telemedicine).  Patients are able to view lab/test results, encounter notes, upcoming appointments, etc.  Non-urgent messages can be sent to your provider as well.   To learn more about what you can do with MyChart, go to [email or web address].     1st Floor: - Lobby - Registration  - Pharmacy  - Lab - Cafe  2nd Floor: - PV Lab - Diagnostic Testing (echo, CT, nuclear med)  3rd Floor: - Vacant  4th Floor: - TCTS (cardiothoracic surgery) - AFib Clinic - Structural Heart Clinic - Vascular Surgery  - Vascular  Ultrasound  5th Floor: - HeartCare Cardiology (general and EP) - Clinical Pharmacy for coumadin, hypertension, lipid, weight-loss medications, and med management appointments    Valet parking services will be available as well.

## 2023-11-11 NOTE — Telephone Encounter (Signed)
 Copied from CRM [id number]. Topic: Clinical - Medication Refill >> Nov 11, 2023  9:44 AM Phil Braun wrote: Most Recent Primary Care Visit:  Provider: Jacquetta Mattocks  Department: [facility]  Visit Type: OFFICE VISIT  Date: 04/26/2023  Medication: famotidine (PEPCID) 20 MG tablet  Has the patient contacted their pharmacy? Yes   Is this the correct pharmacy for this prescription? Yes   This is the patient's preferred pharmacy:   [id number] - [place], [place] - [address] AT [facility] [address] [place] [address] Phone: [phone] Fax: [phone]   Has the prescription been filled recently? Yes  Is the patient out of the medication? Yes  Has the patient been seen for an appointment in the last year OR does the patient have an upcoming appointment? Yes  Can we respond through MyChart? Yes  Agent: Please be advised that Rx refills may take up to 3 business days. We ask that you follow-up with your pharmacy.

## 2023-11-15 MED ORDER — FAMOTIDINE 20 MG PO TABS
20.0000 mg | ORAL_TABLET | Freq: Two times a day (BID) | ORAL | 5 refills | Status: AC
Start: 2023-11-15 — End: ?

## 2023-12-24 ENCOUNTER — Encounter: Payer: Self-pay | Admitting: Nurse Practitioner

## 2023-12-24 ENCOUNTER — Ambulatory Visit (INDEPENDENT_AMBULATORY_CARE_PROVIDER_SITE_OTHER): Payer: Self-pay | Admitting: Nurse Practitioner

## 2023-12-24 VITALS — BP 105/60 | HR 67 | Temp 96.7°F | Ht 59.0 in | Wt 170.0 lb

## 2023-12-24 DIAGNOSIS — Z1211 Encounter for screening for malignant neoplasm of colon: Secondary | ICD-10-CM

## 2023-12-24 DIAGNOSIS — R5383 Other fatigue: Secondary | ICD-10-CM | POA: Diagnosis not present

## 2023-12-24 DIAGNOSIS — R7303 Prediabetes: Secondary | ICD-10-CM | POA: Insufficient documentation

## 2023-12-24 DIAGNOSIS — E559 Vitamin D deficiency, unspecified: Secondary | ICD-10-CM

## 2023-12-24 DIAGNOSIS — K5900 Constipation, unspecified: Secondary | ICD-10-CM

## 2023-12-24 DIAGNOSIS — I10 Essential (primary) hypertension: Secondary | ICD-10-CM

## 2023-12-24 DIAGNOSIS — K219 Gastro-esophageal reflux disease without esophagitis: Secondary | ICD-10-CM | POA: Diagnosis not present

## 2023-12-24 DIAGNOSIS — I471 Supraventricular tachycardia, unspecified: Secondary | ICD-10-CM

## 2023-12-24 NOTE — Assessment & Plan Note (Signed)
 Well-controlled on famotidine  20 mg twice daily Continue current medication Avoid fatty fried food, spicy food, caffeinated drinks and other foods that triggers acid reflux symptoms

## 2023-12-24 NOTE — Patient Instructions (Signed)
 Please consider getting Shingrix  vaccine at local pharmacy.    1. Screening for colon cancer (Primary)  - Ambulatory referral to Gastroenterology  2. Primary hypertension  - Basic Metabolic Panel  3. Gastroesophageal reflux disease, unspecified whether esophagitis present   4. Fatigue, unspecified type  - VITAMIN D  25 Hydroxy (Vit-D Deficiency, Fractures) - Vitamin B12  5. Vitamin D  deficiency  - VITAMIN D  25 Hydroxy (Vit-D Deficiency, Fractures)  6. Prediabetes  - CBC - Hemoglobin A1c      It is important that you exercise regularly at least 30 minutes 5 times a week as tolerated  Think about what you will eat, plan ahead. Choose " clean, green, fresh or frozen" over canned, processed or packaged foods which are more sugary, salty and fatty. 70 to 75% of food eaten should be vegetables and fruit. Three meals at set times with snacks allowed between meals, but they must be fruit or vegetables. Aim to eat over a 12 hour period , example 7 am to 7 pm, and STOP after  your last meal of the day. Drink water,generally about 64 ounces per day, no other drink is as healthy. Fruit juice is best enjoyed in a healthy way, by EATING the fruit.  Thanks for choosing [facility] we consider it a privelige to serve you.

## 2023-12-24 NOTE — Assessment & Plan Note (Signed)
 Continue MiraLAX  17 g daily as needed

## 2023-12-24 NOTE — Assessment & Plan Note (Signed)
 BP Readings from Last 3 Encounters:  12/24/23 105/60  11/11/23 120/80  07/29/23 112/72   HTN Controlled .  On metoprolol  25 mg twice daily Continue current medications. No changes in management. Discussed DASH diet and dietary sodium restrictions Continue to increase dietary efforts and exercise.

## 2023-12-24 NOTE — Assessment & Plan Note (Signed)
 Last vitamin D  Lab Results  Component Value Date   VD25OH 28.2 (L) 02/19/2023  Currently not taking vitamin D  supplement, states that OTC vitamin D  makes her herpes flareup.  Due to complaints of fatigue checking vitamin D  levels again today Encouraged to increase intake of foods rich in vitamin D 

## 2023-12-24 NOTE — Assessment & Plan Note (Signed)
 Patient complains of fatigue, no fever, chills, chest pain, shortness of breath  - CBC - VITAMIN D  25 Hydroxy (Vit-D Deficiency, Fractures) - Vitamin B12

## 2023-12-24 NOTE — Assessment & Plan Note (Signed)
 Controlled on metoprolol  25 mg twice daily and flecainide  20 mg twice daily Continue current medications Maintain close loop with cardiology

## 2023-12-24 NOTE — Assessment & Plan Note (Signed)
 Lab Results  Component Value Date   HGBA1C 6.1 (H) 11/20/2022  Checking labs Avoid sugar sweets soda

## 2023-12-24 NOTE — Progress Notes (Signed)
 Established Patient Office Visit  Subjective:  Patient ID: [name], female    DOB: 02/07/1966  Age: 58 y.o. MRN: [id number]  CC:  Chief Complaint  Patient presents with   Medical Management of Chronic Issues    HPI [name] is a 58 y.o. 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  Patient presents for follow-up for her chronic medical conditions  GERD.  Now taking famotidine  20 mg twice daily.  States that she feels much better on the medication.  Currently denies abdominal pain nausea, vomiting.  Takes MiraLAX  as needed for constipation  Hypertension.  On metoprolol  25 mg twice daily.  Currently denies chest pain, shortness of breath, edema  Due for colon cancer screening colonoscopy ordered.  Need for shingles vaccine discussed patient declined vaccine today         Past Medical History:  Diagnosis Date   ALCOHOL USE 11/13/2008   Qualifier: Diagnosis of  By: Amalia Badder     ASCUS with positive high risk HPV 02/07/2012   Chest pain    CHEST PAIN 11/13/2008   Qualifier: Diagnosis of  By: Amalia Badder     Chronic anemia    Difficulty sleeping    Dysmenorrhea 2011   Dysrhythmia    irregular heart beats   Fibroids 09/20/2013   Heart palpitations    History of abdominal hysterectomy 03/2014   History of uterine fibroid    Menorrhagia    Palpitations 09/23/2006   Qualifier: Diagnosis of  By: WATT, JOANNE     Shortness of breath    "associated w/high heart rate" (01/04/2013)   SUPRAVENTRICULAR TACHYCARDIA 11/13/2008    Qualifier: Diagnosis of  By: Amalia Badder     SVT (supraventricular tachycardia) [facility])     Past Surgical History:  Procedure Laterality Date   ABDOMINAL HYSTERECTOMY     AUGMENTATION MAMMAPLASTY Bilateral    CARDIAC ELECTROPHYSIOLOGY MAPPING AND ABLATION  ~ 2008; 01/04/2013   "weren't able to do the ablation" (01/04/2013)   COLONOSCOPY  12/12/2013   COMBINED AUGMENTATION MAMMAPLASTY AND ABDOMINOPLASTY Bilateral 2002   ELECTROPHYSIOLOGY STUDY N/A 01/04/2013   Procedure: ELECTROPHYSIOLOGY STUDY;  Surgeon: Tammie Fall, MD;  Location: [facility] CATH LAB;  Service: Cardiovascular;  Laterality: N/A;   SUPRAVENTRICULAR TACHYCARDIA ABLATION N/A 01/04/2013   Procedure: SUPRAVENTRICULAR TACHYCARDIA ABLATION;  Surgeon: Tammie Fall, MD;  Location: [facility] CATH LAB;  Service: Cardiovascular;  Laterality: N/A;   TUBAL LIGATION     UTERINE FIBROID SURGERY  ?09/2012   VAGINAL HYSTERECTOMY Bilateral 03/27/2014   Procedure: HYSTERECTOMY VAGINAL WITH BILATERAL SALPINGECTOMY;  Surgeon: Lenord Radon, MD;  Location: [facility] ORS;  Service: Gynecology;  Laterality: Bilateral;    Family History  Problem Relation Age of Onset   Heart Problems Mother        apid heartbeat   Breast cancer Sister 77   Cancer Paternal Aunt        x 2 unkown type   Breast cancer Paternal Aunt 60 - 12   Heart disease Maternal Grandmother    Breast cancer Paternal Grandmother 75   Cancer Brother    Colon cancer Neg Hx     Social History   Socioeconomic History  Marital status: Legally Separated    Spouse name: Not on file   Number of children: 4   Years of education: Not on file   Highest education level: Not on file  Occupational History   Occupation: order Education officer, community: [employer]  Tobacco Use   Smoking status: Former    Current packs/day: 0.00    Average packs/day: 0.3 packs/day for 3.0 years (1.0 ttl pk-yrs)    Types: Cigarettes    Start date: 12/27/1981    Quit date: 12/27/1984    Years since  quitting: 39.0   Smokeless tobacco: Never  Vaping Use   Vaping status: Never Used  Substance and Sexual Activity   Alcohol use: Yes    Comment: occ   Drug use: Not Currently    Types: Marijuana    Comment: every other week   Sexual activity: Yes    Birth control/protection: None  Other Topics Concern   Not on file  Social History Narrative   Lives home alone   Social Drivers of Health   Financial Resource Strain: Not on file  Food Insecurity: Not on file  Transportation Needs: Not on file  Physical Activity: Not on file  Stress: Not on file  Social Connections: Not on file  Intimate Partner Violence: Not on file    Outpatient Medications Prior to Visit  Medication Sig Dispense Refill   famotidine  (PEPCID ) 20 MG tablet Take 1 tablet (20 mg total) by mouth 2 (two) times daily. 60 tablet 5   flecainide  (TAMBOCOR ) 50 MG tablet Take 1 tablet (50 mg total) by mouth 2 (two) times daily. 180 tablet 3   metoprolol  succinate (TOPROL  XL) 25 MG 24 hr tablet Take one tablet by mouth in the morning and two tablets by mouth at bedtime. 270 tablet 3   metoprolol  tartrate (LOPRESSOR ) 50 MG tablet TAKE 1 TABLET BY MOUTH AS NEEDED FOR BREAK THROUGH EPISODES 90 tablet 2   polyethylene glycol powder (GLYCOLAX /MIRALAX ) 17 GM/SCOOP powder 1 capful or pkt PO qd PRN; MAX 1 capful or pkt/day; dissolve in 4-8oz of liquid 255 g 0   pantoprazole  (PROTONIX ) 40 MG tablet TAKE 1 TABLET BY MOUTH DAILY AS NEEDED (Patient not taking: Reported on 12/24/2023) 90 tablet 1   No facility-administered medications prior to visit.    No Known Allergies  ROS Review of Systems  Constitutional:  Positive for fatigue. Negative for appetite change, chills and fever.  HENT:  Negative for congestion, postnasal drip, rhinorrhea and sneezing.   Respiratory:  Negative for cough, shortness of breath and wheezing.   Cardiovascular:  Negative for chest pain, palpitations and leg swelling.  Gastrointestinal:  Negative for  abdominal pain, constipation, nausea and vomiting.  Genitourinary:  Negative for difficulty urinating, dysuria, flank pain and frequency.  Musculoskeletal:  Negative for arthralgias, back pain, joint swelling and myalgias.  Skin:  Negative for color change, pallor, rash and wound.  Neurological:  Negative for dizziness, facial asymmetry, weakness, numbness and headaches.  Psychiatric/Behavioral:  Negative for behavioral problems, confusion, self-injury and suicidal ideas.       Objective:     Physical Exam Vitals and nursing note reviewed.  Constitutional:      General: She is not in acute distress.    Appearance: Normal appearance. She is obese. She is not ill-appearing, toxic-appearing or diaphoretic.  Eyes:     General: No scleral icterus.       Right eye: No discharge.  Left eye: No discharge.     Extraocular Movements: Extraocular movements intact.     Conjunctiva/sclera: Conjunctivae normal.  Cardiovascular:     Rate and Rhythm: Normal rate and regular rhythm.     Pulses: Normal pulses.     Heart sounds: Normal heart sounds. No murmur heard.    No friction rub. No gallop.  Pulmonary:     Effort: Pulmonary effort is normal. No respiratory distress.     Breath sounds: Normal breath sounds. No stridor. No wheezing, rhonchi or rales.  Chest:     Chest wall: No tenderness.  Abdominal:     General: There is no distension.     Palpations: Abdomen is soft.     Tenderness: There is no abdominal tenderness. There is no right CVA tenderness, left CVA tenderness or guarding.  Musculoskeletal:        General: No swelling, tenderness, deformity or signs of injury.     Right lower leg: No edema.     Left lower leg: No edema.  Skin:    General: Skin is warm and dry.     Capillary Refill: Capillary refill takes less than 2 seconds.     Coloration: Skin is not jaundiced or pale.     Findings: No bruising, erythema or lesion.  Neurological:     Mental Status: She is alert and  oriented to person, place, and time.     Motor: No weakness.     Coordination: Coordination normal.     Gait: Gait normal.  Psychiatric:        Mood and Affect: Mood normal.        Behavior: Behavior normal.        Thought Content: Thought content normal.        Judgment: Judgment normal.     BP 105/60   Pulse 67   Temp (!) 96.7 F (35.9 C)   Ht 4\' 11"  (1.499 m)   Wt 170 lb (77.1 kg)   LMP 03/15/2014 (Exact Date)   SpO2 97%   BMI 34.34 kg/m  Wt Readings from Last 3 Encounters:  12/24/23 170 lb (77.1 kg)  11/11/23 167 lb 9.6 oz (76 kg)  07/29/23 171 lb 3.2 oz (77.7 kg)    Lab Results  Component Value Date   TSH 1.440 11/20/2022   Lab Results  Component Value Date   WBC 6.4 04/26/2023   HGB 12.9 04/26/2023   HCT 41.6 04/26/2023   MCV 80 04/26/2023   PLT 324 04/26/2023   Lab Results  Component Value Date   NA 139 11/20/2022   K 4.1 11/20/2022   CO2 21 11/20/2022   GLUCOSE 94 11/20/2022   BUN 7 11/20/2022   CREATININE 0.55 (L) 11/20/2022   BILITOT 0.3 11/20/2022   ALKPHOS 101 11/20/2022   AST 23 11/20/2022   ALT 26 11/20/2022   PROT 7.4 11/20/2022   ALBUMIN 4.3 11/20/2022   CALCIUM 9.3 11/20/2022   ANIONGAP 7 12/12/2020   EGFR 107 11/20/2022   GFR 134.73 12/22/2012   Lab Results  Component Value Date   CHOL 178 11/20/2022   Lab Results  Component Value Date   HDL 52 11/20/2022   Lab Results  Component Value Date   LDLCALC 114 (H) 11/20/2022   Lab Results  Component Value Date   TRIG 65 11/20/2022   Lab Results  Component Value Date   CHOLHDL 3.4 11/20/2022   Lab Results  Component Value Date   HGBA1C 6.1 (H) 11/20/2022  Assessment & Plan:   Problem List Items Addressed This Visit       Cardiovascular and Mediastinum   SVT (supraventricular tachycardia) (HCC)   Controlled on metoprolol  25 mg twice daily and flecainide  20 mg twice daily Continue current medications Maintain close loop with cardiology      Hypertension -  Primary   BP Readings from Last 3 Encounters:  12/24/23 105/60  11/11/23 120/80  07/29/23 112/72   HTN Controlled .  On metoprolol  25 mg twice daily Continue current medications. No changes in management. Discussed DASH diet and dietary sodium restrictions Continue to increase dietary efforts and exercise.         Relevant Orders   Basic Metabolic Panel     Digestive   Gastroesophageal reflux disease   Well-controlled on famotidine  20 mg twice daily Continue current medication Avoid fatty fried food, spicy food, caffeinated drinks and other foods that triggers acid reflux symptoms        Other   Fatigue   Patient complains of fatigue, no fever, chills, chest pain, shortness of breath  - CBC - VITAMIN D  25 Hydroxy (Vit-D Deficiency, Fractures) - Vitamin B12       Relevant Orders   VITAMIN D  25 Hydroxy (Vit-D Deficiency, Fractures)   Vitamin B12   Vitamin D  deficiency   Last vitamin D  Lab Results  Component Value Date   VD25OH 28.2 (L) 02/19/2023  Currently not taking vitamin D  supplement, states that OTC vitamin D  makes her herpes flareup.  Due to complaints of fatigue checking vitamin D  levels again today Encouraged to increase intake of foods rich in vitamin D       Relevant Orders   VITAMIN D  25 Hydroxy (Vit-D Deficiency, Fractures)   Constipation   Continue MiraLAX  17 g daily as needed       Prediabetes   Lab Results  Component Value Date   HGBA1C 6.1 (H) 11/20/2022  Checking labs Avoid sugar sweets soda      Relevant Orders   CBC   Hemoglobin A1c   Other Visit Diagnoses       Screening for colon cancer       Relevant Orders   Ambulatory referral to Gastroenterology       No orders of the defined types were placed in this encounter.   Follow-up: Return in about 4 months (around 04/25/2024) for CPE.    Shalon Salado R Shaunn Tackitt, FNP

## 2023-12-25 LAB — BASIC METABOLIC PANEL WITH GFR
BUN/Creatinine Ratio: 16 (ref 9–23)
BUN: 12 mg/dL (ref 6–24)
CO2: 22 mmol/L (ref 20–29)
Calcium: 9.1 mg/dL (ref 8.7–10.2)
Chloride: 106 mmol/L (ref 96–106)
Creatinine, Ser: 0.73 mg/dL (ref 0.57–1.00)
Glucose: 114 mg/dL — ABNORMAL HIGH (ref 70–99)
Potassium: 4.1 mmol/L (ref 3.5–5.2)
Sodium: 141 mmol/L (ref 134–144)
eGFR: 95 mL/min/{1.73_m2} (ref >59)

## 2023-12-25 LAB — CBC
Hematocrit: 41.2 % (ref 34.0–46.6)
Hemoglobin: 12.8 g/dL (ref 11.1–15.9)
MCH: 24.7 pg — ABNORMAL LOW (ref 26.6–33.0)
MCHC: 31.1 g/dL — ABNORMAL LOW (ref 31.5–35.7)
MCV: 79 fL (ref 79–97)
Platelets: 303 10*3/uL (ref 150–450)
RBC: 5.19 x10E6/uL (ref 3.77–5.28)
RDW: 17.2 % — ABNORMAL HIGH (ref 11.7–15.4)
WBC: 5.5 10*3/uL (ref 3.4–10.8)

## 2023-12-25 LAB — HEMOGLOBIN A1C
Est. average glucose Bld gHb Est-mCnc: 123 mg/dL
Hgb A1c MFr Bld: 5.9 % — ABNORMAL HIGH (ref 4.8–5.6)

## 2023-12-25 LAB — VITAMIN D 25 HYDROXY (VIT D DEFICIENCY, FRACTURES): Vit D, 25-Hydroxy: 22.8 ng/mL — ABNORMAL LOW (ref 30.0–100.0)

## 2023-12-25 LAB — VITAMIN B12: Vitamin B-12: 820 pg/mL (ref 232–1245)

## 2023-12-27 ENCOUNTER — Ambulatory Visit: Payer: Self-pay | Admitting: Nurse Practitioner

## 2024-02-21 ENCOUNTER — Encounter: Payer: Self-pay | Admitting: Gastroenterology

## 2024-02-24 ENCOUNTER — Telehealth: Payer: Self-pay | Admitting: Internal Medicine

## 2024-02-24 NOTE — Telephone Encounter (Signed)
 STAT if HR is under 50 or over 120 (normal HR is 60-100 beats per minute)  What is your heart rate? Patient hasn't check her heart rate. Said she doesn't have anything to check it with.   Do you have a log of your heart rate readings (document readings)? no  Do you have any other symptoms? She said feels like something on her chest and feels her heart racing since.  All these symptoms have been going on and off for the past week, but since yesterday a little more.  She states she does have acid reflux and heartburn so it's hard to tell if it's that.

## 2024-02-24 NOTE — Telephone Encounter (Signed)
 Called patient back about message. She is complaining of a racing heart and chest discomfort that feels like heart burn. Patient does not have any way to check her HR or BP. Asked patient if she feels like she is having an episode of SVT. Patient stated yes. After reviewing her medications, informed patient that she could try taking the metoprolol  tartrate that she takes as needed for these types of episodes. Patient stated she took her morning dose of metoprolol  succinate and flecainide . Patient agreed to take metoprolol  tartrate. 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 Will ask provider.

## 2024-02-24 NOTE — Telephone Encounter (Signed)
 Tried to call patient with Robin Passey PA's advisement. Patient did not answer and no voicemail to leave message. Will try again soon.

## 2024-02-24 NOTE — Telephone Encounter (Signed)
  Pt returning call, she also would like to follow up if she can get a doctor's note so she doesn't have to work [employer]

## 2024-02-24 NOTE — Telephone Encounter (Signed)
 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

## 2024-03-06 NOTE — Progress Notes (Deleted)
 [name] [id number] 1965/12/25   Chief Complaint:   Referring Provider: Paseda, Folashade R, FNP Primary GI MD: Dr. Avram  HPI: [name] is a 58 y.o. female with past medical history of constipation, GERD, SVT who presents today for a complaint of left-sided pain and to discuss colonoscopy.    Patient noted to have chronic left flank pain.  11/29/2022 ER visit for flank pain and at that time described intermittent flank pain for several years diagnosed musculoskeletal issue but the pain would come and go intermittently.  She took Aleve for the symptoms.  At that time thought constipation was contributing.  She was given Baclofen  and MiraLAX .   On 01/27/2023 patient was seen in our office by Delon Failing, PA for left flank pain that at that time was relieved with Baclofen  she was given during ED visit. She also stated taking the Miralax  helped with decreasing pain. At that time Delon recommend she continue daily Miralax . It was suspected her pain was most likely MSK in nature.  Last seen in office 07/14/2023 by Harlene Mail, PA-C for complaint of chronic left flank pain worsened with movement/exercise, relieved by muscle relaxants, not associated with eating and not improved with PPI therapy.  CT scan of the abdomen and pelvis was unremarkable and lab work within normal limits.  H. pylori breath test was ordered.  Patient reported frequent reflux but did not like the way she felt on pantoprazole  so was not using it often.  Trial of Pepcid  was recommended.  Plan was also to maximize her constipation regimen and she was advised to start half to 1 capful MiraLAX  daily.  Advised to seek further evaluation and treatment for her left flank pain with her PCP.  H. pylori test was negative.  Labs 12/24/2023: Stable CBC.  Low vitamin D , prediabetes with hemoglobin A1c 5.9, normal vitamin B12, normal kidney function  Previous GI Procedures/Imaging   CT A/P 05/14/2023 No acute  intra-abdominal or pelvic pathology.   Colonoscopy 12/12/2013 Moderate-sized internal and external hemorrhoids The colon mucosa was otherwise normal  Past Medical History:  Diagnosis Date   ALCOHOL USE 11/13/2008   Qualifier: Diagnosis of  By: Rolan Hough     ASCUS with positive high risk HPV 02/07/2012   Chest pain    CHEST PAIN 11/13/2008   Qualifier: Diagnosis of  By: Rolan Hough     Chronic anemia    Difficulty sleeping    Dysmenorrhea 2011   Dysrhythmia    irregular heart beats   Fibroids 09/20/2013   Heart palpitations    History of abdominal hysterectomy 03/2014   History of uterine fibroid    Menorrhagia    Palpitations 09/23/2006   Qualifier: Diagnosis of  By: WATT, JOANNE     Shortness of breath    associated w/high heart rate (01/04/2013)   SUPRAVENTRICULAR TACHYCARDIA 11/13/2008   Qualifier: Diagnosis of  By: Rolan Hough     SVT (supraventricular tachycardia) [facility])     Past Surgical History:  Procedure Laterality Date   ABDOMINAL HYSTERECTOMY     AUGMENTATION MAMMAPLASTY Bilateral    CARDIAC ELECTROPHYSIOLOGY MAPPING AND ABLATION  ~ 2008; 01/04/2013   weren't able to do the ablation (01/04/2013)   COLONOSCOPY  12/12/2013   COMBINED AUGMENTATION MAMMAPLASTY AND ABDOMINOPLASTY Bilateral 2002   ELECTROPHYSIOLOGY STUDY N/A 01/04/2013   Procedure: ELECTROPHYSIOLOGY STUDY;  Surgeon: Danelle LELON Birmingham, MD;  Location: [facility] CATH LAB;  Service: Cardiovascular;  Laterality: N/A;   SUPRAVENTRICULAR TACHYCARDIA ABLATION N/A  01/04/2013   Procedure: SUPRAVENTRICULAR TACHYCARDIA ABLATION;  Surgeon: Danelle LELON Birmingham, MD;  Location: [facility] CATH LAB;  Service: Cardiovascular;  Laterality: N/A;   TUBAL LIGATION     UTERINE FIBROID SURGERY  ?09/2012   VAGINAL HYSTERECTOMY Bilateral 03/27/2014   Procedure: HYSTERECTOMY VAGINAL WITH BILATERAL SALPINGECTOMY;  Surgeon: Elveria Mungo, MD;  Location: [facility] ORS;  Service: Gynecology;  Laterality: Bilateral;    Current Outpatient  Medications  Medication Sig Dispense Refill   famotidine (PEPCID) 20 MG tablet Take 1 tablet (20 mg total) by mouth 2 (two) times daily. 60 tablet 5   flecainide (TAMBOCOR) 50 MG tablet Take 1 tablet (50 mg total) by mouth 2 (two) times daily. 180 tablet 3   metoprolol succinate (TOPROL XL) 25 MG 24 hr tablet Take 2 tablets ( 50 mg ) twice a day     metoprolol tartrate (LOPRESSOR) 50 MG tablet TAKE 1 TABLET BY MOUTH AS NEEDED FOR BREAK THROUGH EPISODES 90 tablet 2   pantoprazole (PROTONIX) 40 MG tablet TAKE 1 TABLET BY MOUTH DAILY AS NEEDED (Patient not taking: Reported on 12/24/2023) 90 tablet 1   polyethylene glycol powder (GLYCOLAX/MIRALAX) 17 GM/SCOOP powder 1 capful or pkt PO qd PRN; MAX 1 capful or pkt/day; dissolve in 4-8oz of liquid 255 g 0   No current facility-administered medications for this visit.    Allergies as of 03/07/2024   (No Known Allergies)    Family History  Problem Relation Age of Onset   Heart Problems Mother        apid heartbeat   Breast cancer Sister 51   Cancer Paternal Aunt        x 2 unkown type   Breast cancer Paternal Aunt 81 - 66   Heart disease Maternal Grandmother    Breast cancer Paternal Grandmother 32   Cancer Brother    Colon cancer Neg Hx     Social History   Tobacco Use   Smoking status: Former    Current packs/day: 0.00    Average packs/day: 0.3 packs/day for 3.0 years (1.0 ttl pk-yrs)    Types: Cigarettes    Start date: 12/27/1981    Quit date: 12/27/1984    Years since quitting: 39.2   Smokeless tobacco: Never  Vaping Use   Vaping status: Never Used  Substance Use Topics   Alcohol use: Yes    Comment: occ   Drug use: Not Currently    Types: Marijuana    Comment: every other week     Review of Systems:    Constitutional: No weight loss, fever, chills, weakness or fatigue Eyes: No change in vision Ears, Nose, Throat:  No change in hearing or congestion Skin: No rash or itching Cardiovascular: No chest pain, chest pressure  or palpitations   Respiratory: No SOB or cough Gastrointestinal: See HPI and otherwise negative Genitourinary: No dysuria or change in urinary frequency Neurological: No headache, dizziness or syncope Musculoskeletal: No new muscle or joint pain Hematologic: No bleeding or bruising    Physical Exam:  Vital signs: LMP 03/15/2014 (Exact Date)   Constitutional: NAD, Well developed, Well nourished, alert and cooperative Head:  Normocephalic and atraumatic.  Eyes: No scleral icterus. Conjunctiva pink. Mouth: No oral lesions. Respiratory: Respirations even and unlabored. Lungs clear to auscultation bilaterally.  No wheezes, crackles, or rhonchi.  Cardiovascular:  Regular rate and rhythm. No murmurs. No peripheral edema. Gastrointestinal:  Soft, nondistended, nontender. No rebound or guarding. Normal bowel sounds. No appreciable masses or hepatomegaly. Rectal:  Not  performed.  Neurologic:  Alert and oriented x4;  grossly normal neurologically.  Skin:   Dry and intact without significant lesions or rashes. Psychiatric: Oriented to person, place and time. Demonstrates good judgement and reason without abnormal affect or behaviors.   RELEVANT LABS AND IMAGING: CBC    Component Value Date/Time   WBC 5.5 12/24/2023 0854   WBC 7.5 12/12/2020 0829   RBC 5.19 12/24/2023 0854   RBC 5.44 (H) 12/12/2020 0829   HGB 12.8 12/24/2023 0854   HCT 41.2 12/24/2023 0854   PLT 303 12/24/2023 0854   MCV 79 12/24/2023 0854   MCH 24.7 (L) 12/24/2023 0854   MCH 24.4 (L) 12/12/2020 0829   MCHC 31.1 (L) 12/24/2023 0854   MCHC 33.7 12/12/2020 0829   RDW 17.2 (H) 12/24/2023 0854   LYMPHSABS 2.9 11/20/2022 0931   MONOABS 0.4 08/02/2017 0922   EOSABS 0.1 11/20/2022 0931   BASOSABS 0.1 11/20/2022 0931    CMP     Component Value Date/Time   NA 141 12/24/2023 0854   K 4.1 12/24/2023 0854   CL 106 12/24/2023 0854   CO2 22 12/24/2023 0854   GLUCOSE 114 (H) 12/24/2023 0854   GLUCOSE 123 (H) 12/12/2020  0829   BUN 12 12/24/2023 0854   CREATININE 0.73 12/24/2023 0854   CALCIUM 9.1 12/24/2023 0854   PROT 7.4 11/20/2022 0931   ALBUMIN 4.3 11/20/2022 0931   AST 23 11/20/2022 0931   ALT 26 11/20/2022 0931   ALKPHOS 101 11/20/2022 0931   BILITOT 0.3 11/20/2022 0931   GFRNONAA >60 12/12/2020 0829   GFRAA >60 08/02/2017 0922   Echocardiogram 04/21/2019 1. Left ventricular ejection fraction, by visual estimation, is 60 to 65% . The left ventricle has normal function. Normal left ventricular size. There is no left ventricular hypertrophy.  2. Global right ventricle has normal systolic function. The right ventricular size is normal.  3. Left atrial size was normal.  4. Right atrial size was normal.  5. Mild mitral annular calcification.  6. The mitral valve is normal in structure. Trace mitral valve regurgitation. No evidence of mitral stenosis.  7. The tricuspid valve is normal in structure. Tricuspid valve regurgitation is trivial.  8. The aortic valve is normal in structure. Aortic valve regurgitation was not visualized by color flow Doppler. Structurally normal aortic valve, with no evidence of sclerosis or stenosis.  9. The pulmonic valve was normal in structure. Pulmonic valve regurgitation is trivial by color flow Doppler.  10. The inferior vena cava is normal in size with greater than 50% respiratory variability, suggesting right atrial pressure of 3 mmHg.  11. Normal LV systolic function; probable mild diastolic dysfunction.  Assessment/Plan:       Camie Furbish, PA-C  Gastroenterology 03/06/2024, 8:07 PM  Patient Care Team: Paseda, Folashade R, FNP as PCP - General (Nurse Practitioner) Jeffrie Oneil BROCKS, MD as PCP - Cardiology (Cardiology) Waddell Danelle ORN, MD as PCP - Electrophysiology (Cardiology)

## 2024-03-07 ENCOUNTER — Ambulatory Visit: Admitting: Gastroenterology

## 2024-03-13 NOTE — Progress Notes (Unsigned)
  Electrophysiology Office Note:   Date:  03/14/2024  ID:  [name], DOB 01-08-1966, MRN [id number]  Primary Cardiologist: Oneil Parchment, MD Electrophysiologist: Danelle Birmingham, MD   Electrophysiologist:  Danelle Birmingham, MD      History of Present Illness:   [name] is a 58 y.o. female with h/o SVT with remote ablation and AT seen today for routine electrophysiology followup.   Since last being seen in our clinic the patient reports doing OK. She has episodes 2-3 times a month of hear racing, and at times with associated chest pressure. She worries about her ability to work. Works in [employer]. Says sometimes the fumes make her dizzy. 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   She had 2 days where she was having frequent episodes and did not feel able to work.   Review of systems complete and found to be negative unless listed in HPI.   EP Information / Studies Reviewed:    EKG is ordered today. Personal review as below.  EKG Interpretation Date/Time:  Tuesday March 14 2024 09:08:47 EDT Ventricular Rate:  88 PR Interval:  166 QRS Duration:  72 QT Interval:  370 QTC Calculation: 447 R Axis:   31  Text Interpretation: Normal sinus rhythm When compared with ECG of 11-Nov-2023 09:13, No significant change was found Confirmed by Lesia Sharper [phone]) on 03/14/2024 9:16:45 AM    Arrhythmia/Device History No specialty comments available.   Physical Exam:   VS:  BP 124/74 (BP Location: Right Arm, Patient Position: Sitting, Cuff Size: Normal)   Pulse 88   Ht 4' 11 (1.499 m)   Wt 159 lb (72.1 kg)   LMP 03/15/2014 (Exact Date)   BMI 32.11 kg/m    Wt Readings from Last 3 Encounters:  03/14/24 159 lb (72.1 kg)  12/24/23 170 lb (77.1 kg)  11/11/23 167 lb 9.6 oz (76 kg)     GEN: No acute distress NECK: No JVD; No carotid bruits CARDIAC: Regular rate and rhythm, no murmurs, rubs, gallops RESPIRATORY:  Clear to auscultation without  rales, wheezing or rhonchi  ABDOMEN: Soft, non-tender, non-distended EXTREMITIES:  No edema; No deformity   ASSESSMENT AND PLAN:    SVT  AT With prior ablation 2008 Non-inducible on EP study 2014 Recent palpitations with associated chest discomfort Continue flecainide  50 mg BID Continue toprol  50 mg BID. Offered increase, but deferred for now.  Continue lopressor  prn  Long discussion about work and limitations. No specific restrictions in place.  She should avoid triggers in general, but not clear indication to keep her out of work.   Filled out FMLA papers conservatively, as she may have breakthrough episodes -> discussed today that I would recommend changing her therapy as opposed to being written out of work on her current therapy.   She will wear a monitor to help further clarify her episodes and burden.   Atypical chest pain Felt like heartburn  Follow up with Dr. Birmingham at pts request post monitor.   Signed, Sharper Prentice Lesia, PA-C

## 2024-03-14 ENCOUNTER — Ambulatory Visit

## 2024-03-14 ENCOUNTER — Encounter: Payer: Self-pay | Admitting: Student

## 2024-03-14 ENCOUNTER — Telehealth: Payer: Self-pay | Admitting: Internal Medicine

## 2024-03-14 ENCOUNTER — Ambulatory Visit: Attending: Student | Admitting: Student

## 2024-03-14 VITALS — BP 124/74 | HR 88 | Ht 59.0 in | Wt 159.0 lb

## 2024-03-14 DIAGNOSIS — R002 Palpitations: Secondary | ICD-10-CM | POA: Diagnosis not present

## 2024-03-14 DIAGNOSIS — I471 Supraventricular tachycardia, unspecified: Secondary | ICD-10-CM

## 2024-03-14 DIAGNOSIS — I4719 Other supraventricular tachycardia: Secondary | ICD-10-CM | POA: Diagnosis not present

## 2024-03-14 NOTE — Telephone Encounter (Signed)
 New message   Pt was seen today by Jodie Passey.  She is requesting a call from Dr Waddell only.  She would not tell me what it is about.  Please call [phone] early am or late pm---pt works 3rd shift and may be sleeping on the other times.

## 2024-03-14 NOTE — Progress Notes (Unsigned)
Enrolled for [employer] to mail a ZIO XT long term holter monitor to the patients address on file.  °Dr. Taylor to read. °

## 2024-03-14 NOTE — Telephone Encounter (Signed)
 Will forward this message to Dr. Adrian RN for further management and assistance.

## 2024-03-14 NOTE — Patient Instructions (Signed)
 Medication Instructions:  Your physician recommends that you continue on your current medications as directed. Please refer to the Current Medication list given to you today.  *If you need a refill on your cardiac medications before your next appointment, please call your pharmacy*  Lab Work: None ordered If you have labs (blood work) drawn today and your tests are completely normal, you will receive your results only by: MyChart Message (if you have MyChart) OR A paper copy in the mail If you have any lab test that is abnormal or we need to change your treatment, we will call you to review the results.  Testing/Procedures: GEOFFRY HEWS- Long Term Monitor Instructions  Your physician has requested you wear a ZIO patch monitor for 14 days.  This is a single patch monitor. [employer] supplies one patch monitor per enrollment. Additional stickers are not available. Please do not apply patch if you will be having a Nuclear Stress Test,  Echocardiogram, Cardiac CT, MRI, or Chest Xray during the period you would be wearing the  monitor. The patch cannot be worn during these tests. You cannot remove and re-apply the  ZIO XT patch monitor.  Your ZIO patch monitor will be mailed 3 day USPS to your address on file. It may take 3-5 days  to receive your monitor after you have been enrolled.  Once you have received your monitor, please review the enclosed instructions. Your monitor  has already been registered assigning a specific monitor serial # to you.  Billing and Patient Assistance Program Information  We have supplied [employer] with any of your insurance information on file for billing purposes. [employer] offers a sliding scale Patient Assistance Program for patients that do not have  insurance, or whose insurance does not completely cover the cost of the ZIO monitor.  You must apply for the Patient Assistance Program to qualify for this discounted rate.  To apply, please call [employer] at [phone],  select option 4, select option 2, ask to apply for  Patient Assistance Program. Meredeth will ask your household income, and how many people  are in your household. They will quote your out-of-pocket cost based on that information.  [employer] will also be able to set up a 60-month, interest-free payment plan if needed.  Applying the monitor   Shave hair from upper left chest.  Hold abrader disc by orange tab. Rub abrader in 40 strokes over the upper left chest as  indicated in your monitor instructions.  Clean area with 4 enclosed alcohol pads. Let dry.  Apply patch as indicated in monitor instructions. Patch will be placed under collarbone on left  side of chest with arrow pointing upward.  Rub patch adhesive wings for 2 minutes. Remove white label marked 1. Remove the white  label marked 2. Rub patch adhesive wings for 2 additional minutes.  While looking in a mirror, press and release button in center of patch. A small green light will  flash 3-4 times. This will be your only indicator that the monitor has been turned on.  Do not shower for the first 24 hours. You may shower after the first 24 hours.  Press the button if you feel a symptom. You will hear a small click. Record Date, Time and  Symptom in the Patient Logbook.  When you are ready to remove the patch, follow instructions on the last 2 pages of Patient  Logbook. Stick patch monitor onto the last page of Patient Logbook.  Place Patient Logbook in the  blue and white box. Use locking tab on box and tape box closed  securely. The blue and white box has prepaid postage on it. Please place it in the mailbox as  soon as possible. Your physician should have your test results approximately 7 days after the  monitor has been mailed back to [facility].  Call [facility] Customer Care at [phone] if you have questions regarding  your ZIO XT patch monitor. Call them immediately if you see an orange light blinking on your   monitor.  If your monitor falls off in less than 4 days, contact our Monitor department at [phone].  If your monitor becomes loose or falls off after 4 days call [employer] at [phone] for  suggestions on securing your monitor   Follow-Up: At [facility], you and your health needs are our priority.  As part of our continuing mission to provide you with exceptional heart care, our providers are all part of one team.  This team includes your primary Cardiologist (physician) and Advanced Practice Providers or APPs (Physician Assistants and Nurse Practitioners) who all work together to provide you with the care you need, when you need it.  Your next appointment:   6-8 week(s)  Provider:   Danelle Birmingham, MD    We recommend signing up for the patient portal called MyChart.  Sign up information is provided on this After Visit Summary.  MyChart is used to connect with patients for Virtual Visits (Telemedicine).  Patients are able to view lab/test results, encounter notes, upcoming appointments, etc.  Non-urgent messages can be sent to your provider as well.   To learn more about what you can do with MyChart, go to [email or web address].

## 2024-04-18 ENCOUNTER — Encounter: Payer: Self-pay | Admitting: Internal Medicine

## 2024-04-26 ENCOUNTER — Ambulatory Visit: Admitting: Student

## 2024-04-28 ENCOUNTER — Encounter: Payer: Self-pay | Admitting: Nurse Practitioner

## 2024-05-17 ENCOUNTER — Ambulatory Visit: Attending: Cardiology | Admitting: Internal Medicine

## 2024-05-17 ENCOUNTER — Encounter: Payer: Self-pay | Admitting: Internal Medicine

## 2024-05-17 VITALS — BP 122/80 | HR 79 | Ht 59.0 in | Wt 155.1 lb

## 2024-05-17 DIAGNOSIS — I471 Supraventricular tachycardia, unspecified: Secondary | ICD-10-CM | POA: Diagnosis not present

## 2024-05-17 NOTE — Progress Notes (Signed)
 HPI [name] returns today for followup of palpitations. She has a h/o SVT and underwent remote ablation. 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 She has been taking long acting toprol . She notes occasional peripheral edema. She has occasional break throughs but overall has felt well. She is taking flecainide  50 mg twice daily. She notes that she will have on ave a day a month where she has more than an hour of palpitations.  No Known Allergies   Current Outpatient Medications  Medication Sig Dispense Refill   famotidine  (PEPCID ) 20 MG tablet Take 1 tablet (20 mg total) by mouth 2 (two) times daily. 60 tablet 5   flecainide  (TAMBOCOR ) 50 MG tablet Take 1 tablet (50 mg total) by mouth 2 (two) times daily. 180 tablet 3   metoprolol  succinate (TOPROL  XL) 25 MG 24 hr tablet Take 2 tablets ( 50 mg ) twice a day     metoprolol  tartrate (LOPRESSOR ) 50 MG tablet TAKE 1 TABLET BY MOUTH AS NEEDED FOR BREAK THROUGH EPISODES 90 tablet 2   pantoprazole  (PROTONIX ) 40 MG tablet TAKE 1 TABLET BY MOUTH DAILY AS NEEDED 90 tablet 1   polyethylene glycol powder (GLYCOLAX /MIRALAX ) 17 GM/SCOOP powder 1 capful or pkt PO qd PRN; MAX 1 capful or pkt/day; dissolve in 4-8oz of liquid 255 g 0   No current facility-administered medications for this visit.     Past Medical History:  Diagnosis Date   ALCOHOL USE 11/13/2008   Qualifier: Diagnosis of  By: Rolan Hough     ASCUS with positive high risk HPV 02/07/2012   Chest pain    CHEST PAIN 11/13/2008   Qualifier: Diagnosis of  By: Rolan Hough     Chronic anemia    Difficulty sleeping    Dysmenorrhea 2011   Dysrhythmia    irregular heart beats   Fibroids 09/20/2013   Heart palpitations    History of abdominal hysterectomy 03/2014   History of uterine fibroid    Menorrhagia    Palpitations 09/23/2006   Qualifier: Diagnosis of  By: WATT, JOANNE     Shortness of breath    associated w/high heart rate (01/04/2013)    SUPRAVENTRICULAR TACHYCARDIA 11/13/2008   Qualifier: Diagnosis of  By: Rolan Hough     SVT (supraventricular tachycardia)     ROS:   All systems reviewed and negative except as noted in the HPI.   Past Surgical History:  Procedure Laterality Date   ABDOMINAL HYSTERECTOMY     AUGMENTATION MAMMAPLASTY Bilateral    CARDIAC ELECTROPHYSIOLOGY MAPPING AND ABLATION  ~ 2008; 01/04/2013   weren't able to do the ablation (01/04/2013)   COLONOSCOPY  12/12/2013   COMBINED AUGMENTATION MAMMAPLASTY AND ABDOMINOPLASTY Bilateral 2002   ELECTROPHYSIOLOGY STUDY N/A 01/04/2013   Procedure: ELECTROPHYSIOLOGY STUDY;  Surgeon: Robin LELON Birmingham, MD;  Location: [facility] CATH LAB;  Service: Cardiovascular;  Laterality: N/A;   SUPRAVENTRICULAR TACHYCARDIA ABLATION N/A 01/04/2013   Procedure: SUPRAVENTRICULAR TACHYCARDIA ABLATION;  Surgeon: Robin LELON Birmingham, MD;  Location: [facility] CATH LAB;  Service: Cardiovascular;  Laterality: N/A;   TUBAL LIGATION     UTERINE FIBROID SURGERY  ?09/2012   VAGINAL HYSTERECTOMY Bilateral 03/27/2014   Procedure: HYSTERECTOMY VAGINAL WITH BILATERAL SALPINGECTOMY;  Surgeon: Elveria Mungo, MD;  Location: [facility] ORS;  Service: Gynecology;  Laterality: Bilateral;     Family History  Problem Relation Age of Onset   Heart Problems Mother        apid heartbeat  Breast cancer Sister 48   Cancer Paternal Aunt        x 2 unkown type   Breast cancer Paternal Aunt 17 - 77   Heart disease Maternal Grandmother    Breast cancer Paternal Grandmother 59   Cancer Brother    Colon cancer Neg Hx      Social History   Socioeconomic History   Marital status: Legally Separated    Spouse name: Not on file   Number of children: 4   Years of education: Not on file   Highest education level: Not on file  Occupational History   Occupation: order Education officer, community: [name]  Tobacco Use   Smoking status: Former    Current packs/day: 0.00    Average packs/day: 0.3 packs/day for 3.0  years (1.0 ttl pk-yrs)    Types: Cigarettes    Start date: 12/27/1981    Quit date: 12/27/1984    Years since quitting: 39.4   Smokeless tobacco: Never  Vaping Use   Vaping status: Never Used  Substance and Sexual Activity   Alcohol use: Yes    Comment: occ   Drug use: Not Currently    Types: Marijuana    Comment: every other week   Sexual activity: Yes    Birth control/protection: None  Other Topics Concern   Not on file  Social History Narrative   Lives home alone   Social Drivers of Health   Financial Resource Strain: Not on file  Food Insecurity: Not on file  Transportation Needs: Not on file  Physical Activity: Not on file  Stress: Not on file  Social Connections: Not on file  Intimate Partner Violence: Not on file     BP 122/80   Pulse 79   Ht 4' 11 (1.499 m)   Wt 155 lb 1.6 oz (70.4 kg)   LMP 03/15/2014 (Exact Date)   SpO2 95%   BMI 31.33 kg/m   Physical Exam:  Well appearing NAD HEENT: Unremarkable Neck:  No JVD, no thyromegally Lymphatics:  No adenopathy Back:  No CVA tenderness Lungs:  Clear HEART:  Regular rate rhythm, no murmurs, no rubs, no clicks Abd:  soft, positive bowel sounds, no organomegally, no rebound, no guarding Ext:  2 plus pulses, no edema, no cyanosis, no clubbing Skin:  No rashes no nodules Neuro:  CN II through XII intact, motor grossly intact  Assess/Plan:  Recurrent SVT - she was not inducible at EP study years ago for sustained SVT. Only non-sustained. I have discussed the treatment options and I would like to have her continue the beta blocker and flecainde. She will continue flecainide  50 bid and toprol  25 bid and I instructed her to take an extra 1-2 tabs of flecainide  in a day if she needs then. Also, she will bring in FMLA paper work as there are going to be days at least a few times a year where she will have symptoms which would make it hard for her to work.  2. Obesity -- her bmi is over 30 and I encouraged her to  lose. 3. HTN - her bp is controlled. We will follow.   Robin Waddell ROLLA Robin Robin Swor,MD

## 2024-05-17 NOTE — Patient Instructions (Addendum)
 Medication Instructions:  Your physician recommends that you continue on your current medications as directed. Please refer to the Current Medication list given to you today.  You may take extra flecainide  as needed for palpations. Up to 4 in a 24 hour period.  You may take 1 extra lopressor  as needed.  *If you need a refill on your cardiac medications before your next appointment, please call your pharmacy*  Lab Work: None ordered.  You may go to any [facility] Location for your lab work:  [facility] - [address] ([facility]) - [address] [phone] [address]  [place] - [address]   [place]  - [address]   [place] - [address] - [address] [facility] [facility]  - [address] ([facility]   If you have labs (blood work) drawn today and your tests are completely normal, you will receive your results only by: [employer] (if you have MyChart)  If you have any lab test that is abnormal or we need to change your treatment, we will call you or send a MyChart message to review the results.  Testing/Procedures: None ordered.  Follow-Up: At [facility], you and your health needs are our priority.  As part of our continuing mission to provide you with exceptional heart care, we have created designated Provider Care Teams.  These Care Teams include your primary Cardiologist (physician) and Advanced Practice Providers (APPs -  Physician Assistants and Nurse Practitioners) who all work together to provide you with the care you need, when you need it.   Your next appointment:   June 2026  The format for your next appointment:   In Person  Provider:   Donnice Primus, MD or one of the following Advanced Practice Providers on your designated Care Team:   Charlies Arthur, [place] Ozell Jodie Passey, [place] Leotis Barrack, NP  Note:  Remote monitoring is used to monitor your Pacemaker/ ICD from home. This monitoring reduces the number of office visits required to check your device to one time per year. It allows us  to keep an eye on the functioning of your device to ensure it is working properly.

## 2024-07-03 ENCOUNTER — Telehealth: Payer: Self-pay

## 2024-07-03 NOTE — Telephone Encounter (Signed)
 RN attempted to call patient, no answer. Voicemail not set up, RN unable to LVM in regards to scheduling recall colonoscopy.

## 2024-07-07 ENCOUNTER — Telehealth: Payer: Self-pay | Admitting: Internal Medicine

## 2024-07-07 NOTE — Telephone Encounter (Signed)
 Pt sent forms for him to fill out. Calling in for update.

## 2024-07-10 NOTE — Telephone Encounter (Signed)
 Returned pt call regarding forms, vm not set up.

## 2024-07-10 NOTE — Telephone Encounter (Signed)
 Patient returned staff call and stated she will contact the company and have them re-fax the forms.

## 2024-07-10 NOTE — Telephone Encounter (Addendum)
 I looked in the provider's box. I did not see any forms for this patient in the box or on OnBase

## 2024-07-12 ENCOUNTER — Encounter: Payer: Self-pay | Admitting: Nurse Practitioner

## 2024-07-12 ENCOUNTER — Ambulatory Visit: Payer: Self-pay | Admitting: Nurse Practitioner

## 2024-07-12 VITALS — BP 119/64 | HR 77 | Wt 142.6 lb

## 2024-07-12 DIAGNOSIS — Z0000 Encounter for general adult medical examination without abnormal findings: Secondary | ICD-10-CM | POA: Diagnosis not present

## 2024-07-12 DIAGNOSIS — I471 Supraventricular tachycardia, unspecified: Secondary | ICD-10-CM | POA: Diagnosis not present

## 2024-07-12 DIAGNOSIS — Z1159 Encounter for screening for other viral diseases: Secondary | ICD-10-CM | POA: Diagnosis not present

## 2024-07-12 DIAGNOSIS — A6000 Herpesviral infection of urogenital system, unspecified: Secondary | ICD-10-CM | POA: Diagnosis not present

## 2024-07-12 DIAGNOSIS — I10 Essential (primary) hypertension: Secondary | ICD-10-CM

## 2024-07-12 DIAGNOSIS — Z13220 Encounter for screening for lipoid disorders: Secondary | ICD-10-CM | POA: Diagnosis not present

## 2024-07-12 DIAGNOSIS — K5900 Constipation, unspecified: Secondary | ICD-10-CM

## 2024-07-12 DIAGNOSIS — E559 Vitamin D deficiency, unspecified: Secondary | ICD-10-CM | POA: Diagnosis not present

## 2024-07-12 DIAGNOSIS — K219 Gastro-esophageal reflux disease without esophagitis: Secondary | ICD-10-CM

## 2024-07-12 MED ORDER — VALACYCLOVIR HCL 500 MG PO TABS: ORAL_TABLET | ORAL | 3 refills | Status: DC

## 2024-07-12 MED ORDER — FAMOTIDINE 20 MG PO TABS: 20.0000 mg | ORAL_TABLET | Freq: Two times a day (BID) | ORAL | 5 refills | Status: AC

## 2024-07-12 NOTE — Assessment & Plan Note (Addendum)
 Annual exam as documented.  Counseling done include healthy lifestyle involving committing to 150 minutes of exercise per week, heart healthy diet, . The importance of adequate sleep also discussed.  Regular use of seat belt  also discussed . Changes in health habits are decided on by patient with goals and time frames set for achieving them. Immunization and cancer screening  needs are specifically addressed at this visit.     - Encouraged shingles, pneumonia, and flu vaccines. - Ordered hepatitis B immunity lab test.  Provided GI contact information to schedule colonoscopy Up-to-date with mammogram

## 2024-07-12 NOTE — Progress Notes (Signed)
 Complete physical exam  Patient: [name]   DOB: 1965-11-01   58 y.o. Female  MRN: [id number]  Subjective:    Chief Complaint  Patient presents with   Heartburn    Has increased wants to change Pepcid  from bid to tid   Medical Management of Chronic Issues      Discussed the use of AI scribe software for clinical note transcription with the patient, who gave verbal consent to proceed.  History of Present Illness [name] is a 58 year old female with acid reflux who presents with worsening heartburn and bloating and for a CPE  She experiences persistent heartburn and bloating despite taking famotidine  20 mg twice daily at 8 AM and 8 PM. Her symptoms of heartburn return before her evening dose. She describes a burning sensation in her chest and persistent bloating with pain and excessive gas. She has considered whether it would be okay to take famotidine  three times a day instead of two, and has expressed interest in obtaining pantoprazole  for bloating, which she has used previously as needed.  Her dietary habits include attempts to avoid foods that trigger acid reflux, such as fatty, fried, and spicy foods, although she occasionally consumes them. She works a night shift, with her lunch break at 1:30 AM, and primarily eats at night, which may contribute to her symptoms. She has been trying to eat smaller portions and avoid late-night meals. She mentions an embarrassing issue where her burps smell like gas, which she finds distressing.  Her past medical history includes supraventricular tachycardia, for which she takes flecainide  50 mg twice daily and metoprolol  50 mg twice daily. She also has a history of herpes simplex virus, for which she takes Valtrex  during outbreaks, although she has not had a recent outbreak.  In terms of lifestyle, she has been on a diet to reduce fatty food intake and has lost weight, currently weighing around 150 pounds. She stays active by  walking and reports feeling generally well compared to others her age. She lives with her grandchild and does not smoke or use drugs, but drinks alcohol socially. 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  Assessment and Plan Assessment & Plan     Most recent fall risk assessment:    04/26/2023    9:59 AM  Fall Risk   Falls in the past year? 0  Number falls in past yr: 0  Injury with Fall? 0   Risk for fall due to : No Fall Risks  Follow up Falls evaluation completed     Data saved with a previous flowsheet row definition     Most recent depression screenings:    07/12/2024    8:38 AM 12/24/2023    8:13 AM  PHQ 2/9 Scores  PHQ - 2 Score 0 0  PHQ- 9 Score 6         Patient Care Team: Annelisa Ryback R, FNP as PCP - General (Nurse Practitioner) Jeffrie Oneil BROCKS, MD as PCP - Cardiology (Cardiology) Waddell Danelle ORN, MD as PCP - Electrophysiology (Cardiology)   Show/hide medication list[1]  Review of Systems  Constitutional:  Negative for appetite change, chills, fatigue and fever.  HENT:  Negative for congestion, postnasal drip, rhinorrhea and sneezing.   Eyes:  Negative for pain, discharge and itching.  Respiratory:  Negative for cough, shortness of breath and wheezing.   Cardiovascular:  Negative for chest pain, palpitations and leg swelling.  Gastrointestinal:  Positive for abdominal pain and constipation. Negative for nausea and vomiting.  Genitourinary:  Negative for difficulty urinating, dysuria, flank pain and frequency.  Musculoskeletal:  Negative for arthralgias, back pain, joint swelling and myalgias.  Skin:  Negative for color change, pallor, rash and wound.  Neurological:  Negative for dizziness, facial asymmetry, weakness, numbness and headaches.  Psychiatric/Behavioral:  Negative for behavioral problems, confusion, self-injury and suicidal ideas.        Objective:     BP 119/64   Pulse 77   Wt 142 lb 9.6 oz (64.7 kg)   LMP  03/15/2014   SpO2 99%   BMI 28.80 kg/m    Physical Exam Vitals and nursing note reviewed.  Constitutional:      General: She is not in acute distress.    Appearance: Normal appearance. She is not ill-appearing, toxic-appearing or diaphoretic.  HENT:     Right Ear: Tympanic membrane, ear canal and external ear normal. There is no impacted cerumen.     Left Ear: Tympanic membrane, ear canal and external ear normal. There is no impacted cerumen.     Nose: Nose normal. No congestion or rhinorrhea.     Mouth/Throat:     Mouth: Mucous membranes are moist.     Pharynx: Oropharynx is clear. No oropharyngeal exudate or posterior oropharyngeal erythema.  Eyes:     General: No scleral icterus.       Right eye: No discharge.        Left eye: No discharge.     Extraocular Movements: Extraocular movements intact.     Conjunctiva/sclera: Conjunctivae normal.  Neck:     Vascular: No carotid bruit.  Cardiovascular:     Rate and Rhythm: Normal rate and regular rhythm.     Pulses: Normal pulses.     Heart sounds: Normal heart sounds. No murmur heard.    No friction rub. No gallop.  Pulmonary:     Effort: Pulmonary effort is normal. No respiratory distress.     Breath sounds: Normal breath sounds. No stridor. No wheezing, rhonchi or rales.  Chest:     Chest wall: No tenderness.  Abdominal:     General: Bowel sounds are normal. There is no distension.     Palpations: Abdomen is soft. There is no mass.     Tenderness: There is no abdominal tenderness. There is no right CVA tenderness, left CVA tenderness, guarding or rebound.     Hernia: No hernia is present.  Musculoskeletal:        General: No swelling, tenderness, deformity or signs of injury.     Cervical back: Normal range of motion and neck supple. No rigidity or tenderness.     Right lower leg: No edema.     Left lower leg: No edema.  Lymphadenopathy:     Cervical: No cervical adenopathy.  Skin:    General: Skin is warm and dry.      Capillary Refill: Capillary refill takes less than 2 seconds.     Coloration: Skin is not jaundiced or pale.     Findings: No bruising, erythema, lesion or rash.  Neurological:     Mental Status: She is alert and oriented to person, place, and time.     Cranial Nerves: No cranial nerve deficit.     Motor: No weakness.     Gait: Gait normal.  Psychiatric:        Mood and Affect: Mood normal.        Behavior: Behavior normal.  Thought Content: Thought content normal.        Judgment: Judgment normal.     No results found for any visits on 07/12/24.     Assessment & Plan:    Routine Health Maintenance and Physical Exam   There is no immunization history on file for this patient.  Health Maintenance  Topic Date Due   Hepatitis B Vaccines 19-59 Average Risk (1 of 3 - 19+ 3-dose series) Never done   Colonoscopy  12/13/2023   COVID-19 Vaccine (1 - 2025-26 season) Never done   Zoster Vaccines- Shingrix (1 of 2) 10/10/2024 (Originally 02/25/2015)   Influenza Vaccine  10/24/2024 (Originally 02/25/2024)   Pneumococcal Vaccine: 50+ Years (1 of 1 - PCV) 07/12/2025 (Originally 02/25/2015)   Mammogram  09/28/2025   Hepatitis C Screening  Completed   HIV Screening  Completed   HPV VACCINES  Aged Out   Meningococcal B Vaccine  Aged Out   DTaP/Tdap/Td  Discontinued    Discussed health benefits of physical activity, and encouraged her to engage in regular exercise appropriate for her age and condition.  Problem List Items Addressed This Visit       Cardiovascular and Mediastinum   SVT (supraventricular tachycardia)   Supraventricular tachycardia Symptoms improved with flecainide  and metoprolol . - Continue flecainide  50 mg twice daily. - Continue metoprolol  25 mg twice daily. - Follow up with cardiologist as needed.       Hypertension   BP Readings from Last 3 Encounters:  07/12/24 119/64  05/17/24 122/80  03/14/24 124/74    HTN Controlled .  On metoprolol  25 mg twice  daily Continue current medications. No changes in management. Discussed DASH diet and dietary sodium restrictions Continue to increase dietary efforts and exercise.      Relevant Orders   CBC   CMP14+EGFR     Digestive   Gastroesophageal reflux disease   Gastroesophageal reflux disease Persistent heartburn and bloating despite famotidine . Symptoms include upper abdominal pain, gas, and burning in the chest. Possible indigestion contributing to symptoms. - Continue famotidine  20 mg twice daily. - Avoid late-night eating and heavy meals.  Avoid foods that trigger symptoms - Referred to gastroenterologist for further evaluation and management. - Provided educational materials on dietary modifications to avoid acid reflux triggers.       Relevant Medications   famotidine  (PEPCID ) 20 MG tablet   Other Relevant Orders   Ambulatory referral to Gastroenterology     Genitourinary   Herpes genitalia   Genital herpes simplex virus infection No recent outbreaks reported. - Prescribed Valtrex  for outbreak management.       Relevant Medications   valACYclovir  (VALTREX ) 500 MG tablet     Other   Annual physical exam - Primary   Annual exam as documented.  Counseling done include healthy lifestyle involving committing to 150 minutes of exercise per week, heart healthy diet, . The importance of adequate sleep also discussed.  Regular use of seat belt  also discussed . Changes in health habits are decided on by patient with goals and time frames set for achieving them. Immunization and cancer screening  needs are specifically addressed at this visit.     - Encouraged shingles, pneumonia, and flu vaccines. - Ordered hepatitis B immunity lab test.  Provided GI contact information to schedule colonoscopy Up-to-date with mammogram       Vitamin D  deficiency   Relevant Orders   VITAMIN D  25 Hydroxy (Vit-D Deficiency, Fractures)   Constipation   Continue MiraLAX  as  needed Maintain  hydration and increase intake of fiber      Need for hepatitis B screening test   Relevant Orders   Hepatitis B surface antibody,quantitative   Hepatitis B surface antigen   Other Visit Diagnoses       Screening for lipid disorders       Relevant Orders   Lipid panel      Return in about 6 months (around 01/10/2025) for HTN.     Annalese Stiner R Robina Hamor, FNP     [1]  Outpatient Medications Prior to Visit  Medication Sig   flecainide  (TAMBOCOR ) 50 MG tablet Take 1 tablet (50 mg total) by mouth 2 (two) times daily.   metoprolol  tartrate (LOPRESSOR ) 50 MG tablet TAKE 1 TABLET BY MOUTH AS NEEDED FOR BREAK THROUGH EPISODES   polyethylene glycol powder (GLYCOLAX /MIRALAX ) 17 GM/SCOOP powder 1 capful or pkt PO qd PRN; MAX 1 capful or pkt/day; dissolve in 4-8oz of liquid   [DISCONTINUED] famotidine  (PEPCID ) 20 MG tablet Take 1 tablet (20 mg total) by mouth 2 (two) times daily.   metoprolol  succinate (TOPROL  XL) 25 MG 24 hr tablet Take 2 tablets ( 50 mg ) twice a day (Patient not taking: Reported on 07/12/2024)   [DISCONTINUED] pantoprazole  (PROTONIX ) 40 MG tablet TAKE 1 TABLET BY MOUTH DAILY AS NEEDED (Patient not taking: Reported on 07/12/2024)   No facility-administered medications prior to visit.

## 2024-07-12 NOTE — Assessment & Plan Note (Signed)
 Supraventricular tachycardia Symptoms improved with flecainide  and metoprolol . - Continue flecainide  50 mg twice daily. - Continue metoprolol  25 mg twice daily. - Follow up with cardiologist as needed.

## 2024-07-12 NOTE — Patient Instructions (Addendum)
 Treatment, recurrent episode of herpez : Take valtrex  500 mg twice daily for 3 days. Treatment is most effective when initiated  within 1 day of lesion onset   1. Genital herpes simplex, unspecified site (Primary) - valACYclovir  (VALTREX ) 500 MG tablet; Take 500 mg twice daily for 3 days within 1 day of lesion onset  Dispense: 30 tablet; Refill: 3  2. Gastroesophageal reflux disease, unspecified whether esophagitis present - famotidine  (PEPCID ) 20 MG tablet; Take 1 tablet (20 mg total) by mouth 2 (two) times daily.  Dispense: 120 tablet; Refill: 5 - Ambulatory referral to Gastroenterology  3. Primary hypertension - CBC - CMP14+EGFR  4. Vitamin D  deficiency - VITAMIN D  25 Hydroxy (Vit-D Deficiency, Fractures)  5. Screening for lipid disorders - Lipid panel     It is important that you exercise regularly at least 30 minutes 5 times a week as tolerated  Think about what you will eat, plan ahead. Choose  clean, green, fresh or frozen over canned, processed or packaged foods which are more sugary, salty and fatty. 70 to 75% of food eaten should be vegetables and fruit. Three meals at set times with snacks allowed between meals, but they must be fruit or vegetables. Aim to eat over a 12 hour period , example 7 am to 7 pm, and STOP after  your last meal of the day. Drink water,generally about 64 ounces per day, no other drink is as healthy. Fruit juice is best enjoyed in a healthy way, by EATING the fruit.  Thanks for choosing [facility] we consider it a privelige to serve you.

## 2024-07-12 NOTE — Assessment & Plan Note (Signed)
 Continue MiraLAX  as needed Maintain hydration and increase intake of fiber

## 2024-07-12 NOTE — Assessment & Plan Note (Signed)
 BP Readings from Last 3 Encounters:  07/12/24 119/64  05/17/24 122/80  03/14/24 124/74    HTN Controlled .  On metoprolol  25 mg twice daily Continue current medications. No changes in management. Discussed DASH diet and dietary sodium restrictions Continue to increase dietary efforts and exercise.

## 2024-07-12 NOTE — Assessment & Plan Note (Signed)
 Genital herpes simplex virus infection No recent outbreaks reported. - Prescribed Valtrex  for outbreak management.

## 2024-07-12 NOTE — Telephone Encounter (Signed)
 I have the FMLA form.  I have been trying to contact the patient but was unable to leave a voice mail.  I also tried both Contact phone numbers to no avail and her employer on file, who did not recognize her name.  After calling several times, I have sent the form to the patient advising her to call our office regarding the procedure for completing the form.

## 2024-07-12 NOTE — Assessment & Plan Note (Signed)
 Gastroesophageal reflux disease Persistent heartburn and bloating despite famotidine . Symptoms include upper abdominal pain, gas, and burning in the chest. Possible indigestion contributing to symptoms. - Continue famotidine  20 mg twice daily. - Avoid late-night eating and heavy meals.  Avoid foods that trigger symptoms - Referred to gastroenterologist for further evaluation and management. - Provided educational materials on dietary modifications to avoid acid reflux triggers.

## 2024-07-13 DIAGNOSIS — Z0279 Encounter for issue of other medical certificate: Secondary | ICD-10-CM

## 2024-07-13 LAB — CMP14+EGFR
ALT: 23 IU/L (ref 0–32)
AST: 17 IU/L (ref 0–40)
Albumin: 4.2 g/dL (ref 3.8–4.9)
Alkaline Phosphatase: 81 IU/L (ref 49–135)
BUN/Creatinine Ratio: 15 (ref 9–23)
BUN: 12 mg/dL (ref 6–24)
Bilirubin Total: 0.3 mg/dL (ref 0.0–1.2)
CO2: 22 mmol/L (ref 20–29)
Calcium: 9.5 mg/dL (ref 8.7–10.2)
Chloride: 105 mmol/L (ref 96–106)
Creatinine, Ser: 0.8 mg/dL (ref 0.57–1.00)
Globulin, Total: 3.1 g/dL (ref 1.5–4.5)
Glucose: 85 mg/dL (ref 70–99)
Potassium: 4.2 mmol/L (ref 3.5–5.2)
Sodium: 140 mmol/L (ref 134–144)
Total Protein: 7.3 g/dL (ref 6.0–8.5)
eGFR: 85 mL/min/1.73 (ref >59)

## 2024-07-13 LAB — CBC
Hematocrit: 42.5 % (ref 34.0–46.6)
Hemoglobin: 13.5 g/dL (ref 11.1–15.9)
MCH: 25.5 pg — ABNORMAL LOW (ref 26.6–33.0)
MCHC: 31.8 g/dL (ref 31.5–35.7)
MCV: 80 fL (ref 79–97)
Platelets: 319 x10E3/uL (ref 150–450)
RBC: 5.3 x10E6/uL — ABNORMAL HIGH (ref 3.77–5.28)
RDW: 15.9 % — ABNORMAL HIGH (ref 11.7–15.4)
WBC: 5.6 x10E3/uL (ref 3.4–10.8)

## 2024-07-13 LAB — HEPATITIS B SURFACE ANTIBODY, QUANTITATIVE: Hepatitis B Surf Ab Quant: <3.5 m[IU]/mL — ABNORMAL LOW

## 2024-07-13 LAB — VITAMIN D 25 HYDROXY (VIT D DEFICIENCY, FRACTURES): Vit D, 25-Hydroxy: 16 ng/mL — ABNORMAL LOW (ref 30.0–100.0)

## 2024-07-13 LAB — HEPATITIS B SURFACE ANTIGEN: Hepatitis B Surface Ag: NEGATIVE

## 2024-07-13 LAB — LIPID PANEL
Chol/HDL Ratio: 4.3 ratio (ref 0.0–4.4)
Cholesterol, Total: 192 mg/dL (ref 100–199)
HDL: 45 mg/dL (ref >39)
LDL Chol Calc (NIH): 132 mg/dL — ABNORMAL HIGH (ref 0–99)
Triglycerides: 80 mg/dL (ref 0–149)
VLDL Cholesterol Cal: 15 mg/dL (ref 5–40)

## 2024-07-13 NOTE — Telephone Encounter (Signed)
 Patient signed the Release of Information and paid $29 fee.  Form in Dr. Adrian box.

## 2024-07-14 ENCOUNTER — Ambulatory Visit: Payer: Self-pay | Admitting: Nurse Practitioner

## 2024-07-14 DIAGNOSIS — E559 Vitamin D deficiency, unspecified: Secondary | ICD-10-CM

## 2024-07-14 MED ORDER — VITAMIN D3 25 MCG (1000 UT) PO CAPS: 1000.0000 [IU] | ORAL_CAPSULE | Freq: Every day | ORAL | 1 refills | Status: AC

## 2024-07-14 NOTE — Telephone Encounter (Signed)
 Please note Dr Waddell nor myself will be in [place] Office until 12/29.

## 2024-07-17 ENCOUNTER — Telehealth: Payer: Self-pay

## 2024-07-17 ENCOUNTER — Other Ambulatory Visit: Payer: Self-pay | Admitting: Nurse Practitioner

## 2024-07-17 ENCOUNTER — Other Ambulatory Visit: Payer: Self-pay | Admitting: Internal Medicine

## 2024-07-17 DIAGNOSIS — A6000 Herpesviral infection of urogenital system, unspecified: Secondary | ICD-10-CM

## 2024-07-17 MED ORDER — ACYCLOVIR 800 MG PO TABS: ORAL_TABLET | ORAL | 4 refills | Status: AC

## 2024-07-17 NOTE — Progress Notes (Unsigned)
 SABRA

## 2024-07-17 NOTE — Telephone Encounter (Signed)
 Patient was called to go over lab results, upon ending the call, patient stated that she would like to have Zovirax  400 mg filled again verses valacyclovir  500 mg. She stated that she can't tolerate the valacyclovir . Please advise

## 2024-07-26 ENCOUNTER — Telehealth: Payer: Self-pay | Admitting: Internal Medicine

## 2024-07-26 NOTE — Telephone Encounter (Signed)
 Paperwork signed and put in out box

## 2024-07-26 NOTE — Telephone Encounter (Signed)
 Completed FMLA form faxed to [employer] and scanned into chart.  Billing notified.

## 2024-08-31 ENCOUNTER — Telehealth (HOSPITAL_BASED_OUTPATIENT_CLINIC_OR_DEPARTMENT_OTHER): Payer: Self-pay | Admitting: *Deleted

## 2024-08-31 NOTE — Telephone Encounter (Signed)
 error

## 2024-10-11 ENCOUNTER — Ambulatory Visit: Admitting: Nurse Practitioner

## 2024-10-11 ENCOUNTER — Ambulatory Visit: Payer: Self-pay | Admitting: Nurse Practitioner
